# Patient Record
Sex: Female | Born: 1940 | Race: Black or African American | Hispanic: No | Smoking: Never smoker
Health system: Southern US, Community
[De-identification: ages and names within clinical notes are randomized; demographics above are authoritative.]

## PROBLEM LIST (undated history)

## (undated) DIAGNOSIS — H409 Unspecified glaucoma: Secondary | ICD-10-CM

## (undated) DIAGNOSIS — R0600 Dyspnea, unspecified: Secondary | ICD-10-CM

## (undated) DIAGNOSIS — D649 Anemia, unspecified: Secondary | ICD-10-CM

## (undated) DIAGNOSIS — E785 Hyperlipidemia, unspecified: Secondary | ICD-10-CM

## (undated) DIAGNOSIS — I639 Cerebral infarction, unspecified: Secondary | ICD-10-CM

## (undated) DIAGNOSIS — M1990 Unspecified osteoarthritis, unspecified site: Secondary | ICD-10-CM

## (undated) DIAGNOSIS — K5792 Diverticulitis of intestine, part unspecified, without perforation or abscess without bleeding: Secondary | ICD-10-CM

## (undated) DIAGNOSIS — I10 Essential (primary) hypertension: Secondary | ICD-10-CM

## (undated) HISTORY — DX: Unspecified osteoarthritis, unspecified site: M19.90

## (undated) HISTORY — PX: ABDOMINAL HYSTERECTOMY: SHX81

## (undated) HISTORY — DX: Essential (primary) hypertension: I10

## (undated) HISTORY — PX: BREAST SURGERY: SHX581

---

## 1997-10-01 HISTORY — PX: BREAST SURGERY: SHX581

## 2001-02-21 ENCOUNTER — Other Ambulatory Visit: Admission: RE | Admit: 2001-02-21 | Discharge: 2001-02-21 | Payer: Self-pay | Admitting: Family Medicine

## 2001-05-07 ENCOUNTER — Ambulatory Visit (HOSPITAL_COMMUNITY): Admission: RE | Admit: 2001-05-07 | Discharge: 2001-05-07 | Payer: Self-pay | Admitting: General Surgery

## 2001-07-29 ENCOUNTER — Ambulatory Visit (HOSPITAL_COMMUNITY): Admission: RE | Admit: 2001-07-29 | Discharge: 2001-07-29 | Payer: Self-pay | Admitting: Family Medicine

## 2001-07-29 ENCOUNTER — Encounter: Payer: Self-pay | Admitting: Family Medicine

## 2002-07-31 ENCOUNTER — Encounter: Payer: Self-pay | Admitting: Family Medicine

## 2002-07-31 ENCOUNTER — Ambulatory Visit (HOSPITAL_COMMUNITY): Admission: RE | Admit: 2002-07-31 | Discharge: 2002-07-31 | Payer: Self-pay | Admitting: Family Medicine

## 2002-08-28 ENCOUNTER — Ambulatory Visit (HOSPITAL_COMMUNITY): Admission: RE | Admit: 2002-08-28 | Discharge: 2002-08-28 | Payer: Self-pay | Admitting: Family Medicine

## 2002-08-28 ENCOUNTER — Encounter: Payer: Self-pay | Admitting: Family Medicine

## 2002-09-01 ENCOUNTER — Ambulatory Visit (HOSPITAL_COMMUNITY): Admission: RE | Admit: 2002-09-01 | Discharge: 2002-09-01 | Payer: Self-pay | Admitting: Specialist

## 2002-09-01 ENCOUNTER — Encounter: Payer: Self-pay | Admitting: Family Medicine

## 2002-12-01 ENCOUNTER — Ambulatory Visit (HOSPITAL_COMMUNITY): Admission: RE | Admit: 2002-12-01 | Discharge: 2002-12-01 | Payer: Self-pay | Admitting: Family Medicine

## 2002-12-01 ENCOUNTER — Encounter: Payer: Self-pay | Admitting: Family Medicine

## 2003-08-31 ENCOUNTER — Ambulatory Visit (HOSPITAL_COMMUNITY): Admission: RE | Admit: 2003-08-31 | Discharge: 2003-08-31 | Payer: Self-pay | Admitting: Emergency Medicine

## 2003-11-01 ENCOUNTER — Emergency Department (HOSPITAL_COMMUNITY): Admission: EM | Admit: 2003-11-01 | Discharge: 2003-11-01 | Payer: Self-pay | Admitting: Emergency Medicine

## 2004-05-08 ENCOUNTER — Ambulatory Visit (HOSPITAL_COMMUNITY): Admission: RE | Admit: 2004-05-08 | Discharge: 2004-05-08 | Payer: Self-pay | Admitting: Family Medicine

## 2004-12-26 ENCOUNTER — Encounter: Admission: RE | Admit: 2004-12-26 | Discharge: 2004-12-26 | Payer: Self-pay | Admitting: Family Medicine

## 2005-01-22 ENCOUNTER — Ambulatory Visit: Payer: Self-pay | Admitting: Orthopedic Surgery

## 2005-10-25 ENCOUNTER — Ambulatory Visit (HOSPITAL_COMMUNITY): Admission: RE | Admit: 2005-10-25 | Discharge: 2005-10-25 | Payer: Self-pay | Admitting: *Deleted

## 2006-01-22 ENCOUNTER — Ambulatory Visit (HOSPITAL_COMMUNITY): Admission: RE | Admit: 2006-01-22 | Discharge: 2006-01-22 | Payer: Self-pay | Admitting: Family Medicine

## 2007-01-24 ENCOUNTER — Ambulatory Visit (HOSPITAL_COMMUNITY): Admission: RE | Admit: 2007-01-24 | Discharge: 2007-01-24 | Payer: Self-pay | Admitting: Family Medicine

## 2007-09-22 ENCOUNTER — Ambulatory Visit (HOSPITAL_COMMUNITY): Admission: RE | Admit: 2007-09-22 | Discharge: 2007-09-22 | Payer: Self-pay | Admitting: Family Medicine

## 2008-02-12 ENCOUNTER — Ambulatory Visit (HOSPITAL_COMMUNITY): Admission: RE | Admit: 2008-02-12 | Discharge: 2008-02-12 | Payer: Self-pay | Admitting: Family Medicine

## 2008-04-20 DIAGNOSIS — Z8679 Personal history of other diseases of the circulatory system: Secondary | ICD-10-CM | POA: Insufficient documentation

## 2008-04-21 ENCOUNTER — Ambulatory Visit: Payer: Self-pay | Admitting: Orthopedic Surgery

## 2008-04-21 DIAGNOSIS — M25569 Pain in unspecified knee: Secondary | ICD-10-CM

## 2008-04-21 DIAGNOSIS — M1710 Unilateral primary osteoarthritis, unspecified knee: Secondary | ICD-10-CM

## 2008-04-22 ENCOUNTER — Encounter: Payer: Self-pay | Admitting: Orthopedic Surgery

## 2008-05-01 ENCOUNTER — Emergency Department (HOSPITAL_COMMUNITY): Admission: EM | Admit: 2008-05-01 | Discharge: 2008-05-01 | Payer: Self-pay | Admitting: Emergency Medicine

## 2008-10-20 ENCOUNTER — Ambulatory Visit: Payer: Self-pay | Admitting: Orthopedic Surgery

## 2009-01-25 ENCOUNTER — Other Ambulatory Visit: Admission: RE | Admit: 2009-01-25 | Discharge: 2009-01-25 | Payer: Self-pay | Admitting: Family Medicine

## 2009-02-14 ENCOUNTER — Ambulatory Visit (HOSPITAL_COMMUNITY): Admission: RE | Admit: 2009-02-14 | Discharge: 2009-02-14 | Payer: Self-pay | Admitting: Family Medicine

## 2010-03-02 ENCOUNTER — Ambulatory Visit (HOSPITAL_COMMUNITY): Admission: RE | Admit: 2010-03-02 | Discharge: 2010-03-02 | Payer: Self-pay | Admitting: Family Medicine

## 2010-04-19 ENCOUNTER — Ambulatory Visit: Payer: Self-pay | Admitting: Orthopedic Surgery

## 2010-10-31 NOTE — Letter (Signed)
Summary: History form  History form   Imported By: Jacklynn Ganong 04/26/2010 08:30:39  _____________________________________________________________________  External Attachment:    Type:   Image     Comment:   External Document

## 2010-10-31 NOTE — Assessment & Plan Note (Signed)
Summary: bi knee pain left worse needs xr/medicare/bcbs/bsf   Visit Type:  recurrent problem followup  CC:  left knee pain.  History of Present Illness: 70 year old female previously seen for pain in her LEFT knee.  She is now complaining of pain in the back of her knee and leg, denies any injury, describes as throbbing and 2/10.  Says the pain comes and goes came on gradually is better with Aleve.  Seems to be worse if she stays on her feet too long and sometimes she has some knee swelling  She denies weight loss blurred vision chest pain heartburn frequency skin changes numbness paresthesia bruising or bleeding excessive thirst or adverse reactions to food complains of occasional shortness of breath and of course knee stiffness  She has a history of hypertension  Family physician Dr. 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  Allergies: No Known Drug Allergies  Physical Exam  Additional Exam:   *GEN: appearance was normal   ** CDV: normal pulses temperature and no edema  * LYMPH nodes were normal   * SKIN was normal   * Neuro: normal sensation ** Psyche: AAO x 3 and mood was normal   MSK  Her ambulation was normal She had no tenderness or swelling in her knee she had 125 of flexion with grade 5 motor strength.  All ligaments were stable.    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 Patient Level IV (09811) Knee x-ray,  3 views (91478)  Patient Instructions: 1)  Please schedule a follow-up appointment as needed.

## 2011-03-16 ENCOUNTER — Other Ambulatory Visit (HOSPITAL_COMMUNITY): Payer: Self-pay | Admitting: Family Medicine

## 2011-03-16 DIAGNOSIS — Z139 Encounter for screening, unspecified: Secondary | ICD-10-CM

## 2011-03-20 ENCOUNTER — Ambulatory Visit (HOSPITAL_COMMUNITY)
Admission: RE | Admit: 2011-03-20 | Discharge: 2011-03-20 | Disposition: A | Discharge status: Home / Self Care | Payer: Medicare Other | Source: Ambulatory Visit | Attending: Family Medicine | Admitting: Family Medicine

## 2011-03-20 DIAGNOSIS — Z139 Encounter for screening, unspecified: Secondary | ICD-10-CM

## 2011-03-20 DIAGNOSIS — Z1231 Encounter for screening mammogram for malignant neoplasm of breast: Secondary | ICD-10-CM | POA: Insufficient documentation

## 2011-07-06 LAB — COMPREHENSIVE METABOLIC PANEL
Albumin: 3.9
BUN: 15
CO2: 31
Calcium: 9.8
Creatinine, Ser: 1.06
GFR calc Af Amer: >60
Glucose, Bld: 102 — ABNORMAL HIGH
Potassium: 3.8
Sodium: 139
Total Bilirubin: 0.6
Total Protein: 7.5

## 2011-07-06 LAB — LIPID PANEL
Cholesterol: 205 — ABNORMAL HIGH
Total CHOL/HDL Ratio: 6.2
VLDL: 21

## 2011-11-15 DIAGNOSIS — H40059 Ocular hypertension, unspecified eye: Secondary | ICD-10-CM | POA: Diagnosis not present

## 2011-12-05 DIAGNOSIS — H2510 Age-related nuclear cataract, unspecified eye: Secondary | ICD-10-CM | POA: Diagnosis not present

## 2011-12-06 DIAGNOSIS — IMO0002 Reserved for concepts with insufficient information to code with codable children: Secondary | ICD-10-CM | POA: Diagnosis not present

## 2011-12-06 DIAGNOSIS — H2510 Age-related nuclear cataract, unspecified eye: Secondary | ICD-10-CM | POA: Diagnosis not present

## 2012-01-15 DIAGNOSIS — H2510 Age-related nuclear cataract, unspecified eye: Secondary | ICD-10-CM | POA: Diagnosis not present

## 2012-01-21 DIAGNOSIS — H2510 Age-related nuclear cataract, unspecified eye: Secondary | ICD-10-CM | POA: Diagnosis not present

## 2012-01-21 DIAGNOSIS — IMO0002 Reserved for concepts with insufficient information to code with codable children: Secondary | ICD-10-CM | POA: Diagnosis not present

## 2012-02-07 DIAGNOSIS — M1990 Unspecified osteoarthritis, unspecified site: Secondary | ICD-10-CM | POA: Diagnosis not present

## 2012-02-07 DIAGNOSIS — I10 Essential (primary) hypertension: Secondary | ICD-10-CM | POA: Diagnosis not present

## 2012-02-07 DIAGNOSIS — E782 Mixed hyperlipidemia: Secondary | ICD-10-CM | POA: Diagnosis not present

## 2012-02-07 DIAGNOSIS — E781 Pure hyperglyceridemia: Secondary | ICD-10-CM | POA: Diagnosis not present

## 2012-03-31 DIAGNOSIS — H1045 Other chronic allergic conjunctivitis: Secondary | ICD-10-CM | POA: Diagnosis not present

## 2012-04-07 ENCOUNTER — Other Ambulatory Visit (HOSPITAL_COMMUNITY): Payer: Self-pay | Admitting: Family Medicine

## 2012-04-07 DIAGNOSIS — Z139 Encounter for screening, unspecified: Secondary | ICD-10-CM

## 2012-04-14 ENCOUNTER — Ambulatory Visit (HOSPITAL_COMMUNITY)
Admission: RE | Admit: 2012-04-14 | Discharge: 2012-04-14 | Disposition: A | Discharge status: Home / Self Care | Payer: Medicare Other | Source: Ambulatory Visit | Attending: Family Medicine | Admitting: Family Medicine

## 2012-04-14 DIAGNOSIS — Z139 Encounter for screening, unspecified: Secondary | ICD-10-CM

## 2012-04-14 DIAGNOSIS — Z1231 Encounter for screening mammogram for malignant neoplasm of breast: Secondary | ICD-10-CM | POA: Insufficient documentation

## 2012-05-07 DIAGNOSIS — M25519 Pain in unspecified shoulder: Secondary | ICD-10-CM | POA: Diagnosis not present

## 2012-05-12 ENCOUNTER — Ambulatory Visit (HOSPITAL_COMMUNITY)
Admission: RE | Admit: 2012-05-12 | Discharge: 2012-05-12 | Disposition: A | Discharge status: Home / Self Care | Payer: Medicare Other | Source: Ambulatory Visit | Attending: Family Medicine | Admitting: Family Medicine

## 2012-05-12 ENCOUNTER — Other Ambulatory Visit (HOSPITAL_COMMUNITY): Payer: Self-pay | Admitting: Family Medicine

## 2012-05-12 DIAGNOSIS — M2580 Other specified joint disorders, unspecified joint: Secondary | ICD-10-CM | POA: Diagnosis not present

## 2012-05-12 DIAGNOSIS — M25519 Pain in unspecified shoulder: Secondary | ICD-10-CM | POA: Insufficient documentation

## 2012-05-12 DIAGNOSIS — M25829 Other specified joint disorders, unspecified elbow: Secondary | ICD-10-CM | POA: Insufficient documentation

## 2012-05-12 DIAGNOSIS — M719 Bursopathy, unspecified: Secondary | ICD-10-CM | POA: Diagnosis not present

## 2012-05-12 DIAGNOSIS — M67919 Unspecified disorder of synovium and tendon, unspecified shoulder: Secondary | ICD-10-CM | POA: Diagnosis not present

## 2012-05-12 DIAGNOSIS — M19019 Primary osteoarthritis, unspecified shoulder: Secondary | ICD-10-CM | POA: Diagnosis not present

## 2012-06-04 DIAGNOSIS — M25519 Pain in unspecified shoulder: Secondary | ICD-10-CM | POA: Diagnosis not present

## 2012-06-04 DIAGNOSIS — I10 Essential (primary) hypertension: Secondary | ICD-10-CM | POA: Diagnosis not present

## 2012-07-01 DIAGNOSIS — Z23 Encounter for immunization: Secondary | ICD-10-CM | POA: Diagnosis not present

## 2012-07-02 ENCOUNTER — Ambulatory Visit (INDEPENDENT_AMBULATORY_CARE_PROVIDER_SITE_OTHER): Payer: Medicare Other | Admitting: Orthopedic Surgery

## 2012-07-02 ENCOUNTER — Encounter: Payer: Self-pay | Admitting: Orthopedic Surgery

## 2012-07-02 VITALS — BP 104/60 | Ht 64.0 in | Wt 176.0 lb

## 2012-07-02 DIAGNOSIS — M75100 Unspecified rotator cuff tear or rupture of unspecified shoulder, not specified as traumatic: Secondary | ICD-10-CM | POA: Insufficient documentation

## 2012-07-02 DIAGNOSIS — M19019 Primary osteoarthritis, unspecified shoulder: Secondary | ICD-10-CM

## 2012-07-02 DIAGNOSIS — M12819 Other specific arthropathies, not elsewhere classified, unspecified shoulder: Secondary | ICD-10-CM

## 2012-07-02 NOTE — Progress Notes (Signed)
  Subjective:    Patient ID: Monique Wagner, female    DOB: 1941-02-28, 71 y.o.   MRN: [id number]  HPI Comments: The patient has mild LEFT shoulder pain with forward elevation. No weakness. She had x-rays of both shoulders. 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  The LEFT is not as bad on x-ray, but does show subtle changes of developing chronic rotator cuff arthrosis.    Shoulder Pain  The pain is present in the left shoulder. This is a new problem. The current episode started 1 to 4 weeks ago. There has been no history of extremity trauma. The problem occurs daily. The quality of the pain is described as aching.      Review of Systems  Respiratory: Positive for shortness of breath.        Objective:   Physical Exam  Constitutional: She is oriented to person, place, and time. She appears well-developed and well-nourished.  HENT:  Head: Normocephalic.  Eyes: Pupils are equal, round, and reactive to light.  Neck: Normal range of motion.  Pulmonary/Chest: Effort normal.  Abdominal: She exhibits no distension.  Musculoskeletal:       Right shoulder: Normal.       Left shoulder: She exhibits decreased strength. She exhibits normal range of motion, no tenderness, no bony tenderness, no swelling, no deformity, no pain and normal pulse.       She has a positive impingement sign at 120  Lymphadenopathy:    She has no cervical adenopathy.  Neurological: She is alert and oriented to person, place, and time. She has normal reflexes.  Skin: Skin is warm and dry. No rash noted. No erythema. No pallor.  Psychiatric: She has a normal mood and affect. Her behavior is normal. Judgment and thought content normal.   X-rays were done at the hospital  The x-ray report, and the film has been reviewed. My interpretation, of the images: RIGHT rotator cuff arthrosis, chronic, severe  LEFT early signs of rotator cuff arthrosis        Assessment & Plan:    LEFT rotator cuff, chronic disease.  LEFT subacromial injection.  Physical therapy at home after instructions from our occupational therapy department

## 2012-07-02 NOTE — Patient Instructions (Addendum)
You have received a steroid shot. 15% of patients experience increased pain at the injection site with in the next 24 hours. This is best treated with ice and tylenol extra strength 2 tabs every 8 hours. If you are still having pain please call the office.   Start  OT 

## 2012-07-10 ENCOUNTER — Ambulatory Visit (HOSPITAL_COMMUNITY)
Admission: RE | Admit: 2012-07-10 | Discharge: 2012-07-10 | Disposition: A | Discharge status: Home / Self Care | Payer: Medicare Other | Source: Ambulatory Visit | Attending: Orthopedic Surgery | Admitting: Orthopedic Surgery

## 2012-07-10 DIAGNOSIS — M6281 Muscle weakness (generalized): Secondary | ICD-10-CM | POA: Insufficient documentation

## 2012-07-10 DIAGNOSIS — M25519 Pain in unspecified shoulder: Secondary | ICD-10-CM | POA: Diagnosis not present

## 2012-07-10 DIAGNOSIS — M25619 Stiffness of unspecified shoulder, not elsewhere classified: Secondary | ICD-10-CM | POA: Insufficient documentation

## 2012-07-10 DIAGNOSIS — IMO0001 Reserved for inherently not codable concepts without codable children: Secondary | ICD-10-CM | POA: Insufficient documentation

## 2012-07-10 DIAGNOSIS — I10 Essential (primary) hypertension: Secondary | ICD-10-CM | POA: Insufficient documentation

## 2012-07-10 DIAGNOSIS — M75100 Unspecified rotator cuff tear or rupture of unspecified shoulder, not specified as traumatic: Secondary | ICD-10-CM

## 2012-07-10 NOTE — Evaluation (Signed)
Occupational Therapy Evaluation  Patient Details  Name: Monique Wagner MRN: [id number] Date of Birth: October 02, 1940  Today's Date: 07/10/2012 Time: 1020-1050 OT Time Calculation (min): 30 min OT Evaluation 30' Visit#: 1  of 1   Re-eval: 07/10/12  Assessment Diagnosis: Left Shoulder Rotator Cuff Tear Next MD Visit: unscheduled Prior Therapy: none  Authorization: Medicare  Past Medical History:  Past Medical History  Diagnosis Date  . HTN (hypertension)   . Arthritis    Past Surgical History: No past surgical history on file.  Subjective S:  I want some exercises that I can complete at home.  I don't think I need to come to therapy. Pertinent History: Monique Wagner has been experiencing intermittent pain in her left shoulder for greater than a year.  She consulted with her family physician and with Dr. Romeo Apple, who has referred to occupational therapy for a one time visit for education on a HEP.  She states that she is able to do all of her daily activities that she wants to do.   Patient Stated Goals: Im not sure.  I can really do just about everything. Pain Assessment Currently in Pain?: No/denies  Precautions/Restrictions  Precautions Precautions: None  Prior Functioning  Home Living Lives With: Alone Prior Function Level of Independence: Independent with basic ADLs;Independent with homemaking with ambulation Driving: Yes Vocation: Retired Leisure: Hobbies-yes (Comment) Comments: Enjoys staying active, reading, crocheting, and playing the piano  Assessment ADL/Vision/Perception ADL ADL Comments: Monique Wagner has pain occassionally when rolling her hair, laying in her bed, and turning her steering wheel. Dominant Hand: Right Vision - History Baseline Vision: No visual deficits Perception Perception: Within Functional Limits Praxis Praxis: Intact  Cognition/Observation Cognition Orientation Level: Oriented X4  Sensation/Coordination/Edema Sensation Light Touch:  Appears Intact Coordination Gross Motor Movements are Fluid and Coordinated: Yes Fine Motor Movements are Fluid and Coordinated: Yes  Additional Assessments LUE AROM (degrees) LUE Overall AROM Comments: Assessed in seated.  ER/IR with shoulder abducted Left Shoulder Flexion: 142 Degrees Left Shoulder ABduction: 145 Degrees Left Shoulder Internal Rotation: 20 Degrees Left Shoulder External Rotation: 70 Degrees LUE Strength LUE Overall Strength Comments: Limitations: RCR Protocol AAROM 10/8-10/22 then progress to AROM Left Shoulder Flexion: 5/5 Left Shoulder ABduction: 5/5 Left Shoulder Internal Rotation: 5/5 Left Shoulder External Rotation: 5/5     Occupational Therapy Assessment and Plan OT Assessment and Plan Clinical Impression Statement: A:  Patient presents with minimal deficits including decreased AROM and increased pain in her left shoulder.  These deficits are not effecting her day to day activities. OT Frequency: Min 1X/week OT Duration: Other (comment) (1 week) OT Treatment/Interventions: Patient/family education OT Plan: P:  DC from OT services this date.  One time visit for education on HEP.   Goals Home Exercise Program Pt will Perform Home Exercise Program: Independently PT Goal: Perform Home Exercise Program - Progress: Goal set today  Problem List Patient Active Problem List  Diagnosis  . Osteoarth NOS-L/Leg  . KNEE PAIN  . HIGH BLOOD PRESSURE  . Rotator cuff tear arthropathy  . Pain in joint, shoulder region    End of Session Activity Tolerance: Patient tolerated treatment well General Behavior During Session: [facility] for tasks performed Cognition: [facility] for tasks performed OT Plan of Care OT Home Exercise Plan: Educated on HEP for scapular stability with theraband and for shoulder stretches.  Patient demonstrated I with HEP. Consulted and Agree with Plan of Care: Patient  GO Functional Assessment Tool Used: UEFI 86% I level, 24% impairment  level Functional Limitation: Carrying, moving and handling objects Carrying, Moving and Handling Objects Current Status [phone]): At least 20 percent but less than 40 percent impaired, limited or restricted Carrying, Moving and Handling Objects Goal Status [id number]): At least 20 percent but less than 40 percent impaired, limited or restricted Carrying, Moving and Handling Objects Discharge Status [phone]): At least 20 percent but less than 40 percent impaired, limited or restricted  Shirlean Mylar, OTR/L  07/10/2012, 11:01 AM  Physician Documentation Your signature is required to indicate approval of the treatment plan as stated above.  Please sign and either send electronically or make a copy of this report for your files and return this physician signed original.  Please mark one 1.__approve of plan  2. ___approve of plan with the following conditions.   ______________________________                                                          _____________________ Physician Signature                                                                                                             Date

## 2012-09-02 DIAGNOSIS — M1990 Unspecified osteoarthritis, unspecified site: Secondary | ICD-10-CM | POA: Diagnosis not present

## 2012-09-02 DIAGNOSIS — I10 Essential (primary) hypertension: Secondary | ICD-10-CM | POA: Diagnosis not present

## 2012-10-23 DIAGNOSIS — H40059 Ocular hypertension, unspecified eye: Secondary | ICD-10-CM | POA: Diagnosis not present

## 2013-01-05 DIAGNOSIS — I10 Essential (primary) hypertension: Secondary | ICD-10-CM | POA: Diagnosis not present

## 2013-01-05 DIAGNOSIS — E785 Hyperlipidemia, unspecified: Secondary | ICD-10-CM | POA: Diagnosis not present

## 2013-01-05 DIAGNOSIS — M130 Polyarthritis, unspecified: Secondary | ICD-10-CM | POA: Diagnosis not present

## 2013-01-05 DIAGNOSIS — E782 Mixed hyperlipidemia: Secondary | ICD-10-CM | POA: Diagnosis not present

## 2013-01-19 DIAGNOSIS — M1990 Unspecified osteoarthritis, unspecified site: Secondary | ICD-10-CM | POA: Diagnosis not present

## 2013-01-19 DIAGNOSIS — I10 Essential (primary) hypertension: Secondary | ICD-10-CM | POA: Diagnosis not present

## 2013-02-06 DIAGNOSIS — M130 Polyarthritis, unspecified: Secondary | ICD-10-CM | POA: Diagnosis not present

## 2013-03-05 DIAGNOSIS — M719 Bursopathy, unspecified: Secondary | ICD-10-CM | POA: Diagnosis not present

## 2013-03-05 DIAGNOSIS — M67919 Unspecified disorder of synovium and tendon, unspecified shoulder: Secondary | ICD-10-CM | POA: Diagnosis not present

## 2013-03-05 DIAGNOSIS — M25469 Effusion, unspecified knee: Secondary | ICD-10-CM | POA: Diagnosis not present

## 2013-03-05 DIAGNOSIS — M25569 Pain in unspecified knee: Secondary | ICD-10-CM | POA: Diagnosis not present

## 2013-03-16 DIAGNOSIS — M130 Polyarthritis, unspecified: Secondary | ICD-10-CM | POA: Diagnosis not present

## 2013-03-16 DIAGNOSIS — I10 Essential (primary) hypertension: Secondary | ICD-10-CM | POA: Diagnosis not present

## 2013-04-02 ENCOUNTER — Other Ambulatory Visit (HOSPITAL_COMMUNITY): Payer: Self-pay | Admitting: Family Medicine

## 2013-04-02 DIAGNOSIS — Z139 Encounter for screening, unspecified: Secondary | ICD-10-CM

## 2013-04-21 DIAGNOSIS — M25519 Pain in unspecified shoulder: Secondary | ICD-10-CM | POA: Diagnosis not present

## 2013-04-21 DIAGNOSIS — M25569 Pain in unspecified knee: Secondary | ICD-10-CM | POA: Diagnosis not present

## 2013-04-30 ENCOUNTER — Ambulatory Visit (HOSPITAL_COMMUNITY)
Admission: RE | Admit: 2013-04-30 | Discharge: 2013-04-30 | Disposition: A | Discharge status: Home / Self Care | Payer: Medicare Other | Source: Ambulatory Visit | Attending: Family Medicine | Admitting: Family Medicine

## 2013-04-30 DIAGNOSIS — Z1231 Encounter for screening mammogram for malignant neoplasm of breast: Secondary | ICD-10-CM | POA: Insufficient documentation

## 2013-04-30 DIAGNOSIS — Z139 Encounter for screening, unspecified: Secondary | ICD-10-CM

## 2013-05-19 DIAGNOSIS — Z09 Encounter for follow-up examination after completed treatment for conditions other than malignant neoplasm: Secondary | ICD-10-CM | POA: Diagnosis not present

## 2013-05-19 DIAGNOSIS — Z86010 Personal history of colonic polyps: Secondary | ICD-10-CM | POA: Diagnosis not present

## 2013-05-19 DIAGNOSIS — K648 Other hemorrhoids: Secondary | ICD-10-CM | POA: Diagnosis not present

## 2013-05-27 DIAGNOSIS — Z0000 Encounter for general adult medical examination without abnormal findings: Secondary | ICD-10-CM | POA: Diagnosis not present

## 2013-08-14 DIAGNOSIS — L0201 Cutaneous abscess of face: Secondary | ICD-10-CM | POA: Diagnosis not present

## 2013-08-21 DIAGNOSIS — L0201 Cutaneous abscess of face: Secondary | ICD-10-CM | POA: Diagnosis not present

## 2013-10-22 DIAGNOSIS — H2510 Age-related nuclear cataract, unspecified eye: Secondary | ICD-10-CM | POA: Diagnosis not present

## 2013-10-22 DIAGNOSIS — H5200 Hypermetropia, unspecified eye: Secondary | ICD-10-CM | POA: Diagnosis not present

## 2014-01-20 DIAGNOSIS — I10 Essential (primary) hypertension: Secondary | ICD-10-CM | POA: Diagnosis not present

## 2014-01-20 DIAGNOSIS — M1990 Unspecified osteoarthritis, unspecified site: Secondary | ICD-10-CM | POA: Diagnosis not present

## 2014-03-29 ENCOUNTER — Other Ambulatory Visit (HOSPITAL_COMMUNITY): Payer: Self-pay | Admitting: Family Medicine

## 2014-03-29 DIAGNOSIS — Z139 Encounter for screening, unspecified: Secondary | ICD-10-CM

## 2014-03-29 DIAGNOSIS — Z1231 Encounter for screening mammogram for malignant neoplasm of breast: Secondary | ICD-10-CM

## 2014-05-03 ENCOUNTER — Ambulatory Visit (HOSPITAL_COMMUNITY)
Admission: RE | Admit: 2014-05-03 | Discharge: 2014-05-03 | Disposition: A | Discharge status: Home / Self Care | Payer: Medicare Other | Source: Ambulatory Visit | Attending: Family Medicine | Admitting: Family Medicine

## 2014-05-03 DIAGNOSIS — Z1231 Encounter for screening mammogram for malignant neoplasm of breast: Secondary | ICD-10-CM | POA: Diagnosis not present

## 2014-06-29 DIAGNOSIS — M130 Polyarthritis, unspecified: Secondary | ICD-10-CM | POA: Diagnosis not present

## 2014-06-29 DIAGNOSIS — E781 Pure hyperglyceridemia: Secondary | ICD-10-CM | POA: Diagnosis not present

## 2014-06-29 DIAGNOSIS — Z23 Encounter for immunization: Secondary | ICD-10-CM | POA: Diagnosis not present

## 2014-06-29 DIAGNOSIS — I10 Essential (primary) hypertension: Secondary | ICD-10-CM | POA: Diagnosis not present

## 2014-07-21 DIAGNOSIS — M129 Arthropathy, unspecified: Secondary | ICD-10-CM | POA: Diagnosis not present

## 2014-07-21 DIAGNOSIS — Z713 Dietary counseling and surveillance: Secondary | ICD-10-CM | POA: Diagnosis not present

## 2014-08-04 DIAGNOSIS — M170 Bilateral primary osteoarthritis of knee: Secondary | ICD-10-CM | POA: Diagnosis not present

## 2014-08-04 DIAGNOSIS — M7532 Calcific tendinitis of left shoulder: Secondary | ICD-10-CM | POA: Diagnosis not present

## 2014-08-04 DIAGNOSIS — M25422 Effusion, left elbow: Secondary | ICD-10-CM | POA: Diagnosis not present

## 2014-08-04 DIAGNOSIS — M7989 Other specified soft tissue disorders: Secondary | ICD-10-CM | POA: Diagnosis not present

## 2014-09-01 DIAGNOSIS — M25449 Effusion, unspecified hand: Secondary | ICD-10-CM | POA: Diagnosis not present

## 2014-09-01 DIAGNOSIS — R700 Elevated erythrocyte sedimentation rate: Secondary | ICD-10-CM | POA: Diagnosis not present

## 2014-09-01 DIAGNOSIS — M130 Polyarthritis, unspecified: Secondary | ICD-10-CM | POA: Diagnosis not present

## 2014-09-01 DIAGNOSIS — M7532 Calcific tendinitis of left shoulder: Secondary | ICD-10-CM | POA: Diagnosis not present

## 2014-09-03 ENCOUNTER — Other Ambulatory Visit: Payer: Self-pay

## 2014-09-03 ENCOUNTER — Encounter (HOSPITAL_COMMUNITY): Payer: Self-pay | Admitting: *Deleted

## 2014-09-03 ENCOUNTER — Observation Stay (HOSPITAL_COMMUNITY)
Admission: EM | Admit: 2014-09-03 | Discharge: 2014-09-04 | Disposition: A | Discharge status: Home / Self Care | Payer: Medicare Other | Attending: Internal Medicine | Admitting: Internal Medicine

## 2014-09-03 ENCOUNTER — Emergency Department (HOSPITAL_COMMUNITY): Payer: Medicare Other

## 2014-09-03 DIAGNOSIS — R0789 Other chest pain: Secondary | ICD-10-CM | POA: Diagnosis not present

## 2014-09-03 DIAGNOSIS — E785 Hyperlipidemia, unspecified: Secondary | ICD-10-CM | POA: Insufficient documentation

## 2014-09-03 DIAGNOSIS — Z7952 Long term (current) use of systemic steroids: Secondary | ICD-10-CM | POA: Insufficient documentation

## 2014-09-03 DIAGNOSIS — R072 Precordial pain: Secondary | ICD-10-CM

## 2014-09-03 DIAGNOSIS — I10 Essential (primary) hypertension: Secondary | ICD-10-CM | POA: Diagnosis not present

## 2014-09-03 DIAGNOSIS — M1990 Unspecified osteoarthritis, unspecified site: Secondary | ICD-10-CM | POA: Diagnosis not present

## 2014-09-03 DIAGNOSIS — N289 Disorder of kidney and ureter, unspecified: Secondary | ICD-10-CM | POA: Diagnosis not present

## 2014-09-03 DIAGNOSIS — Z7982 Long term (current) use of aspirin: Secondary | ICD-10-CM | POA: Insufficient documentation

## 2014-09-03 DIAGNOSIS — R079 Chest pain, unspecified: Principal | ICD-10-CM | POA: Diagnosis present

## 2014-09-03 DIAGNOSIS — Z79899 Other long term (current) drug therapy: Secondary | ICD-10-CM | POA: Diagnosis not present

## 2014-09-03 DIAGNOSIS — E782 Mixed hyperlipidemia: Secondary | ICD-10-CM

## 2014-09-03 HISTORY — DX: Hyperlipidemia, unspecified: E78.5

## 2014-09-03 LAB — CBC WITH DIFFERENTIAL/PLATELET
BASOS PCT: 0 % (ref 0–1)
Basophils Absolute: 0 10*3/uL (ref 0.0–0.1)
EOS ABS: 0 10*3/uL (ref 0.0–0.7)
EOS PCT: 0 % (ref 0–5)
HEMATOCRIT: 39.6 % (ref 36.0–46.0)
HEMOGLOBIN: 12.9 g/dL (ref 12.0–15.0)
LYMPHS ABS: 1.9 10*3/uL (ref 0.7–4.0)
Lymphocytes Relative: 25 % (ref 12–46)
MCH: 28.9 pg (ref 26.0–34.0)
MCHC: 32.6 g/dL (ref 30.0–36.0)
MCV: 88.6 fL (ref 78.0–100.0)
MONO ABS: 0.3 10*3/uL (ref 0.1–1.0)
MONOS PCT: 4 % (ref 3–12)
Neutro Abs: 5.2 10*3/uL (ref 1.7–7.7)
Neutrophils Relative %: 71 % (ref 43–77)
Platelets: 211 10*3/uL (ref 150–400)
RBC: 4.47 MIL/uL (ref 3.87–5.11)
RDW: 15.1 % (ref 11.5–15.5)
WBC: 7.3 10*3/uL (ref 4.0–10.5)

## 2014-09-03 LAB — BASIC METABOLIC PANEL
Anion gap: 13 (ref 5–15)
BUN: 22 mg/dL (ref 6–23)
CALCIUM: 10.6 mg/dL — AB (ref 8.4–10.5)
CHLORIDE: 96 meq/L (ref 96–112)
CO2: 31 meq/L (ref 19–32)
CREATININE: 1.61 mg/dL — AB (ref 0.50–1.10)
GFR calc Af Amer: 36 mL/min — ABNORMAL LOW (ref >90)
GFR calc non Af Amer: 31 mL/min — ABNORMAL LOW (ref >90)
Glucose, Bld: 103 mg/dL — ABNORMAL HIGH (ref 70–99)
Potassium: 3.4 mEq/L — ABNORMAL LOW (ref 3.7–5.3)
Sodium: 140 mEq/L (ref 137–147)

## 2014-09-03 LAB — TROPONIN I: Troponin I: <0.3 ng/mL (ref <0.30)

## 2014-09-03 MED ORDER — ACETAMINOPHEN 650 MG RE SUPP: 650.0000 mg | Freq: Four times a day (QID) | RECTAL | Status: DC | PRN

## 2014-09-03 MED ORDER — SODIUM CHLORIDE 0.9 % IJ SOLN: 3.0000 mL | INTRAMUSCULAR | Status: DC | PRN

## 2014-09-03 MED ORDER — LOSARTAN POTASSIUM-HCTZ 50-12.5 MG PO TABS: 1.0000 | ORAL_TABLET | Freq: Every day | ORAL | Status: DC

## 2014-09-03 MED ORDER — PREDNISONE 10 MG PO TABS
10.0000 mg | ORAL_TABLET | Freq: Every day | ORAL | Status: DC
  Administered 2014-09-04: 10 mg via ORAL

## 2014-09-03 MED ORDER — ASPIRIN EC 325 MG PO TBEC
325.0000 mg | DELAYED_RELEASE_TABLET | Freq: Every day | ORAL | Status: DC
  Administered 2014-09-04: 325 mg via ORAL

## 2014-09-03 MED ORDER — ENOXAPARIN SODIUM 40 MG/0.4ML ~~LOC~~ SOLN: 40.0000 mg | SUBCUTANEOUS | Status: DC

## 2014-09-03 MED ORDER — ACETAMINOPHEN 325 MG PO TABS: 650.0000 mg | ORAL_TABLET | Freq: Four times a day (QID) | ORAL | Status: DC | PRN

## 2014-09-03 MED ORDER — SENNOSIDES-DOCUSATE SODIUM 8.6-50 MG PO TABS: 1.0000 | ORAL_TABLET | Freq: Every evening | ORAL | Status: DC | PRN

## 2014-09-03 MED ORDER — ASPIRIN 81 MG PO CHEW: 324.0000 mg | CHEWABLE_TABLET | Freq: Once | ORAL | Status: DC

## 2014-09-03 MED ORDER — ONDANSETRON HCL 4 MG/2ML IJ SOLN
4.0000 mg | Freq: Four times a day (QID) | INTRAMUSCULAR | Status: DC | PRN
Start: 2014-09-03 — End: 2014-09-04

## 2014-09-03 MED ORDER — OMEGA-3-ACID ETHYL ESTERS 1 G PO CAPS
1.0000 g | ORAL_CAPSULE | Freq: Every day | ORAL | Status: DC
  Administered 2014-09-04: 1 g via ORAL

## 2014-09-03 MED ORDER — SODIUM CHLORIDE 0.9 % IV SOLN: 250.0000 mL | INTRAVENOUS | Status: DC | PRN

## 2014-09-03 MED ORDER — HYDROCHLOROTHIAZIDE 12.5 MG PO CAPS
12.5000 mg | ORAL_CAPSULE | Freq: Every day | ORAL | Status: DC
  Administered 2014-09-04: 12.5 mg via ORAL

## 2014-09-03 MED ORDER — ONDANSETRON HCL 4 MG PO TABS: 4.0000 mg | ORAL_TABLET | Freq: Four times a day (QID) | ORAL | Status: DC | PRN

## 2014-09-03 MED ORDER — SODIUM CHLORIDE 0.9 % IJ SOLN
3.0000 mL | Freq: Two times a day (BID) | INTRAMUSCULAR | Status: DC
  Administered 2014-09-03 – 2014-09-04 (×2): 3 mL via INTRAVENOUS

## 2014-09-03 MED ORDER — LOSARTAN POTASSIUM 50 MG PO TABS
50.0000 mg | ORAL_TABLET | Freq: Every day | ORAL | Status: DC
  Administered 2014-09-04: 50 mg via ORAL

## 2014-09-03 MED ORDER — ATORVASTATIN CALCIUM 20 MG PO TABS
20.0000 mg | ORAL_TABLET | Freq: Every evening | ORAL | Status: DC
  Administered 2014-09-03: 20 mg via ORAL

## 2014-09-03 MED ORDER — NITROGLYCERIN 0.4 MG SL SUBL
0.4000 mg | SUBLINGUAL_TABLET | SUBLINGUAL | Status: DC | PRN
  Administered 2014-09-03: 0.4 mg via SUBLINGUAL

## 2014-09-03 MED ORDER — OXYCODONE HCL 5 MG PO TABS: 5.0000 mg | ORAL_TABLET | ORAL | Status: DC | PRN

## 2014-09-03 MED ORDER — CARVEDILOL PHOSPHATE ER 10 MG PO CP24
20.0000 mg | ORAL_CAPSULE | Freq: Every day | ORAL | Status: DC
  Administered 2014-09-04: 20 mg via ORAL
  Filled 2014-09-03 (×3): qty 2

## 2014-09-03 NOTE — ED Provider Notes (Signed)
CSN: [id number]     Arrival date & time 09/03/14  1138 History   First MD Initiated Contact with Patient 09/03/14 1235     Chief Complaint  Patient presents with  . Chest Pain      HPI  Pt was seen at 1245. Per pt, c/o gradual onset and persistence of multiple intermittent episodes of chest "discomfort" for the past 1 month, worse over the past 2 weeks. Pt describes her symptoms as mid-sternal chest "heaviness." States she "notices it" when she is "doing things," and laying down improves her discomfort. States she "walked out of church" several days ago "because it was starting up again." Pt has not taken any meds for same. Denies any relationship with food intake. Denies palpitations, no SOB/cough, no abd pain, no N/V/D, no fevers, no rash.     Past Medical History  Diagnosis Date  . HTN (hypertension)   . Arthritis   . Hyperlipidemia    Past Surgical History  Procedure Laterality Date  . Abdominal hysterectomy     Family History  Problem Relation Age of Onset  . Arthritis    . Cancer    . Diabetes    . Heart disease Other    History  Substance Use Topics  . Smoking status: Never Smoker   . Smokeless tobacco: Not on file  . Alcohol Use: No    Review of Systems ROS: Statement: All systems negative except as marked or noted in the HPI; Constitutional: Negative for fever and chills. ; ; Eyes: Negative for eye pain, redness and discharge. ; ; ENMT: Negative for ear pain, hoarseness, nasal congestion, sinus pressure and sore throat. ; ; Cardiovascular: +CP. Negative for palpitations, diaphoresis, dyspnea and peripheral edema. ; ; Respiratory: Negative for cough, wheezing and stridor. ; ; Gastrointestinal: Negative for nausea, vomiting, diarrhea, abdominal pain, blood in stool, hematemesis, jaundice and rectal bleeding. . ; ; Genitourinary: Negative for dysuria, flank pain and hematuria. ; ; Musculoskeletal: Negative for back pain and neck pain. Negative for swelling and trauma.; ;  Skin: Negative for pruritus, rash, abrasions, blisters, bruising and skin lesion.; ; Neuro: Negative for headache, lightheadedness and neck stiffness. Negative for weakness, altered level of consciousness , altered mental status, extremity weakness, paresthesias, involuntary movement, seizure and syncope.      Allergies  Review of patient's allergies indicates no known allergies.  Home Medications   Prior to Admission medications   Medication Sig Start Date End Date Taking? Authorizing Provider  aspirin 325 MG EC tablet Take 325 mg by mouth daily.   Yes Historical Provider, MD  atorvastatin (LIPITOR) 20 MG tablet Take 20 mg by mouth daily.   Yes Historical Provider, MD  carvedilol (COREG CR) 20 MG 24 hr capsule Take 20 mg by mouth daily.   Yes Historical Provider, MD  losartan-hydrochlorothiazide (HYZAAR) 50-12.5 MG per tablet Take 1 tablet by mouth daily.   Yes Historical Provider, MD  Multiple Vitamins-Minerals (MULTIVITAMIN PO) Take 1 tablet by mouth daily.    Yes Historical Provider, MD  Omega-3 Fatty Acids (FISH OIL PO) Take 1 capsule by mouth daily.    Yes Historical Provider, MD  predniSONE (DELTASONE) 10 MG tablet Take 1 tablet by mouth daily. 08/04/14  Yes Historical Provider, MD  triamterene-hydrochlorothiazide (DYAZIDE) 37.5-25 MG per capsule Take 1 capsule by mouth every morning.   Yes Historical Provider, MD   BP 108/64 mmHg  Pulse 54  Temp(Src) 98.6 F (37 C) (Oral)  Resp 18  Ht 5\' 4"  (  1.626 m)  Wt 174 lb (78.926 kg)  BMI 29.85 kg/m2  SpO2 100% Physical Exam  1250: Physical examination:  Nursing notes reviewed; Vital signs and O2 SAT reviewed;  Constitutional: Well developed, Well nourished, Well hydrated, In no acute distress; Head:  Normocephalic, atraumatic; Eyes: EOMI, PERRL, No scleral icterus; ENMT: Mouth and pharynx normal, Mucous membranes moist; Neck: Supple, Full range of motion, No lymphadenopathy; Cardiovascular: Regular rate and rhythm, No gallop; Respiratory:  Breath sounds clear & equal bilaterally, No rales, rhonchi, wheezes.  Speaking full sentences with ease, Normal respiratory effort/excursion; Chest: Nontender, Movement normal; Abdomen: Soft, Nontender, Nondistended, Normal bowel sounds; Genitourinary: No CVA tenderness; Extremities: Pulses normal, No tenderness, No edema, No calf edema or asymmetry.; Neuro: AA&Ox3, vague historian. Major CN grossly intact.  Speech clear. No gross focal motor or sensory deficits in extremities. Climbs on and off stretcher easily by herself. Gait steady.; Skin: Color normal, Warm, Dry.   ED Course  Procedures     EKG Interpretation None      MDM  MDM Reviewed: previous chart, nursing note and vitals Reviewed previous: labs and ECG Interpretation: labs, ECG and x-ray      Date: 09/03/2014  Rate: 56  Rhythm: normal sinus rhythm  QRS Axis: left  Intervals: normal  ST/T Wave abnormalities: normal  Conduction Disutrbances:none  Narrative Interpretation: abnormal R-wave progression, early transition  Old EKG Reviewed: none available.   Results for orders placed or performed during the hospital encounter of 09/03/14  Basic metabolic panel  Result Value Ref Range   Sodium 140 137 - 147 mEq/L   Potassium 3.4 (L) 3.7 - 5.3 mEq/L   Chloride 96 96 - 112 mEq/L   CO2 31 19 - 32 mEq/L   Glucose, Bld 103 (H) 70 - 99 mg/dL   BUN 22 6 - 23 mg/dL   Creatinine, Ser 9.511.61 (H) 0.50 - 1.10 mg/dL   Calcium 88.410.6 (H) 8.4 - 10.5 mg/dL   GFR calc non Af Amer 31 (L) >90 mL/min   GFR calc Af Amer 36 (L) >90 mL/min   Anion gap 13 5 - 15  Troponin I  Result Value Ref Range   Troponin I <0.30 <0.30 ng/mL  CBC with Differential  Result Value Ref Range   WBC 7.3 4.0 - 10.5 K/uL   RBC 4.47 3.87 - 5.11 MIL/uL   Hemoglobin 12.9 12.0 - 15.0 g/dL   HCT 16.639.6 06.336.0 - 01.646.0 %   MCV 88.6 78.0 - 100.0 fL   MCH 28.9 26.0 - 34.0 pg   MCHC 32.6 30.0 - 36.0 g/dL   RDW 01.015.1 93.211.5 - 35.515.5 %   Platelets 211 150 - 400 K/uL    Neutrophils Relative % 71 43 - 77 %   Neutro Abs 5.2 1.7 - 7.7 K/uL   Lymphocytes Relative 25 12 - 46 %   Lymphs Abs 1.9 0.7 - 4.0 K/uL   Monocytes Relative 4 3 - 12 %   Monocytes Absolute 0.3 0.1 - 1.0 K/uL   Eosinophils Relative 0 0 - 5 %   Eosinophils Absolute 0.0 0.0 - 0.7 K/uL   Basophils Relative 0 0 - 1 %   Basophils Absolute 0.0 0.0 - 0.1 K/uL   Dg Chest 2 View 09/03/2014   CLINICAL DATA:  Chest tightness.  EXAM: CHEST  2 VIEW  COMPARISON:  PA and lateral chest 09/22/2007.  FINDINGS: The heart size and mediastinal contours are within normal limits. Both lungs are clear. The visualized skeletal structures are  unremarkable.  IMPRESSION: No active cardiopulmonary disease.   Electronically Signed   By: Drusilla Kannerhomas  Dalessio M.D.   On: 09/03/2014 13:38    1435:  Pt improved after SL ntg. Several cardiac risk factors, will observation admit. Dx and testing d/w pt and family.  Questions answered.  Verb understanding, agreeable to admit. T/C to [facility] Dr. Ardyth HarpsHernandez, case discussed, including:  HPI, pertinent PM/SHx, VS/PE, dx testing, ED course and treatment:  Agreeable to admit, requests to write temporary orders, obtain observation tele bed to team [id number].    Samuel JesterKathleen Michele Judy, DO 09/05/14 1321

## 2014-09-03 NOTE — Progress Notes (Signed)
Patient refused Hyzaar and Aspirin patient states she took her medication earlier.

## 2014-09-03 NOTE — ED Notes (Signed)
Chest pain for 2 weeks ,intermittent "uncomfortable"   Had cough 2 weeks ago.  No N/V  , no fever

## 2014-09-03 NOTE — H&P (Addendum)
[facility]          History and Physical    PCP:   Maggie Font, MD   Chief Complaint:  Chest pain  HPI: Patient is a 73 year old lady with past medical history significant for hypertension, hyperlipidemia, rheumatoid arthritis maintained on chronic prednisone. She presents to the hospital today with complaints of about a 2 week history of precordial chest pain. She has "not paid any attention to it" and hence cannot give me any qualifying factors about her pain, i.e. she does not know if it is related to exertion or to food. When asked why she decided to come to the hospital today she tells me that with the holidays approaching she wanted to make sure that she was okay. She denies shortness of breath, headache, palpitations, lightheadedness, dizziness. She tells me that about 15 years ago she saw Dr. Melvern Banker for chest pain and she had a cardiac catheterization that was reported as "normal"; however this predates Epic and I do not have a way of retrieving this record. We have been asked to admit her for further evaluation and management.  Allergies:  No Known Allergies    Past Medical History  Diagnosis Date  . HTN (hypertension)   . Arthritis   . Hyperlipidemia     Past Surgical History  Procedure Laterality Date  . Abdominal hysterectomy      Prior to Admission medications   Medication Sig Start Date End Date Taking? Authorizing Provider  aspirin 325 MG EC tablet Take 325 mg by mouth daily.   Yes Historical Provider, MD  atorvastatin (LIPITOR) 20 MG tablet Take 20 mg by mouth daily.   Yes Historical Provider, MD  carvedilol (COREG CR) 20 MG 24 hr capsule Take 20 mg by mouth daily.   Yes Historical Provider, MD  losartan-hydrochlorothiazide (HYZAAR) 50-12.5 MG per tablet Take 1 tablet by mouth daily.   Yes Historical Provider, MD  Multiple Vitamins-Minerals (MULTIVITAMIN PO) Take 1 tablet by mouth daily.    Yes Historical Provider, MD  Omega-3 Fatty  Acids (FISH OIL PO) Take 1 capsule by mouth daily.    Yes Historical Provider, MD  predniSONE (DELTASONE) 10 MG tablet Take 1 tablet by mouth daily. 08/04/14  Yes Historical Provider, MD  triamterene-hydrochlorothiazide (DYAZIDE) 37.5-25 MG per capsule Take 1 capsule by mouth every morning.   Yes Historical Provider, MD    Social History:  reports that she has never smoked. She does not have any smokeless tobacco history on file. She reports that she does not drink alcohol or use illicit drugs.  Family History  Problem Relation Age of Onset  . Arthritis    . Cancer    . Diabetes    . Heart disease Other     Review of Systems:  Constitutional: Denies fever, chills, diaphoresis, appetite change and fatigue.  HEENT: Denies photophobia, eye pain, redness, hearing loss, ear pain, congestion, sore throat, rhinorrhea, sneezing, mouth sores, trouble swallowing, neck pain, neck stiffness and tinnitus.   Respiratory: Denies SOB, DOE, cough and wheezing.   Cardiovascular: Denies palpitations and leg swelling.  Gastrointestinal: Denies nausea, vomiting, abdominal pain, diarrhea, constipation, blood in stool and abdominal distention.  Genitourinary: Denies dysuria, urgency, frequency, hematuria, flank pain and difficulty urinating.  Endocrine: Denies: hot or cold intolerance, sweats, changes in hair or nails, polyuria, polydipsia. Musculoskeletal: Denies myalgias, back pain, joint swelling, arthralgias and gait problem.  Skin: Denies pallor,  rash and wound.  Neurological: Denies dizziness, seizures, syncope, weakness, light-headedness, numbness and headaches.  Hematological: Denies adenopathy. Easy bruising, personal or family bleeding history  Psychiatric/Behavioral: Denies suicidal ideation, mood changes, confusion, nervousness, sleep disturbance and agitation   Physical Exam: Blood pressure 118/56, pulse 57, temperature 98.6 F (37 C), temperature source Oral, resp. rate 18, height 5' 4" (1.626  m), weight 78.926 kg (174 lb), SpO2 100 %. General: Alert, awake, oriented 3, no distress, pleasant and cooperative with exam. HEENT: Normocephalic, atraumatic, pupils equal round and reactive to light, extraocular movements intact. Neck: Supple, no JVD, no lymphadenopathy, no bruits, no goiter. Cardiovascular: Regular rate and rhythm, no murmurs, rubs or gallops. Lungs: Clear to auscultation bilaterally. Abdomen: Soft, nontender, nondistended, positive bowel sounds, no masses or organomegaly noted. Extremities: No clubbing, cyanosis or edema, positive pedal pulses. Neurologic: Grossly intact and nonfocal.  Labs on Admission:  Results for orders placed or performed during the hospital encounter of 09/03/14 (from the past 48 hour(s))  Basic metabolic panel     Status: Abnormal   Collection Time: 09/03/14 12:23 PM  Result Value Ref Range   Sodium 140 137 - 147 mEq/L   Potassium 3.4 (L) 3.7 - 5.3 mEq/L   Chloride 96 96 - 112 mEq/L   CO2 31 19 - 32 mEq/L   Glucose, Bld 103 (H) 70 - 99 mg/dL   BUN 22 6 - 23 mg/dL   Creatinine, Ser 1.61 (H) 0.50 - 1.10 mg/dL   Calcium 10.6 (H) 8.4 - 10.5 mg/dL   GFR calc non Af Amer 31 (L) >90 mL/min   GFR calc Af Amer 36 (L) >90 mL/min    Comment: (NOTE) The eGFR has been calculated using the CKD EPI equation. This calculation has not been validated in all clinical situations. eGFR's persistently <90 mL/min signify possible Chronic Kidney Disease.    Anion gap 13 5 - 15  Troponin I     Status: None   Collection Time: 09/03/14 12:23 PM  Result Value Ref Range   Troponin I <0.30 <0.30 ng/mL    Comment:        Due to the release kinetics of cTnI, a negative result within the first hours of the onset of symptoms does not rule out myocardial infarction with certainty. If myocardial infarction is still suspected, repeat the test at appropriate intervals.   CBC with Differential     Status: None   Collection Time: 09/03/14 12:23 PM  Result Value  Ref Range   WBC 7.3 4.0 - 10.5 K/uL   RBC 4.47 3.87 - 5.11 MIL/uL   Hemoglobin 12.9 12.0 - 15.0 g/dL   HCT 39.6 36.0 - 46.0 %   MCV 88.6 78.0 - 100.0 fL   MCH 28.9 26.0 - 34.0 pg   MCHC 32.6 30.0 - 36.0 g/dL   RDW 15.1 11.5 - 15.5 %   Platelets 211 150 - 400 K/uL   Neutrophils Relative % 71 43 - 77 %   Neutro Abs 5.2 1.7 - 7.7 K/uL   Lymphocytes Relative 25 12 - 46 %   Lymphs Abs 1.9 0.7 - 4.0 K/uL   Monocytes Relative 4 3 - 12 %   Monocytes Absolute 0.3 0.1 - 1.0 K/uL   Eosinophils Relative 0 0 - 5 %   Eosinophils Absolute 0.0 0.0 - 0.7 K/uL   Basophils Relative 0 0 - 1 %   Basophils Absolute 0.0 0.0 - 0.1 K/uL    Radiological Exams on Admission: Dg Chest  2 View  09/03/2014   CLINICAL DATA:  Chest tightness.  EXAM: CHEST  2 VIEW  COMPARISON:  PA and lateral chest 09/22/2007.  FINDINGS: The heart size and mediastinal contours are within normal limits. Both lungs are clear. The visualized skeletal structures are unremarkable.  IMPRESSION: No active cardiopulmonary disease.   Electronically Signed   By: Inge Rise M.D.   On: 09/03/2014 13:38    Assessment/Plan Principal Problem:   Chest pain Active Problems:   HTN (hypertension)   Hyperlipidemia   Chest pain -Coronary artery disease risk factors include hypertension and hyperlipidemia. -We'll admit to a telemetry bed, will cycle troponins to rule out acute coronary syndrome. -Interestingly, her chest pain did improve with administration of aspirin and nitroglycerin in the emergency department. -EKG is not presently available for review, will order EKG today and tomorrow morning. -If she rules out, we'll plan on arranging outpatient follow-up with cardiology for possible stress testing.  Hypertension -Continue home medications.  Hyperlipidemia -Continue home medications, check lipid profile.  DVT prophylaxis -Lovenox  CODE STATUS -Full code   Time Spent on Admission: 75 minutes  [facility] Pager: [phone] 09/03/2014, 4:31 PM

## 2014-09-04 ENCOUNTER — Other Ambulatory Visit: Payer: Self-pay

## 2014-09-04 DIAGNOSIS — R079 Chest pain, unspecified: Secondary | ICD-10-CM | POA: Diagnosis not present

## 2014-09-04 DIAGNOSIS — M1990 Unspecified osteoarthritis, unspecified site: Secondary | ICD-10-CM | POA: Diagnosis not present

## 2014-09-04 DIAGNOSIS — E785 Hyperlipidemia, unspecified: Secondary | ICD-10-CM | POA: Diagnosis not present

## 2014-09-04 DIAGNOSIS — I10 Essential (primary) hypertension: Secondary | ICD-10-CM | POA: Diagnosis not present

## 2014-09-04 DIAGNOSIS — N289 Disorder of kidney and ureter, unspecified: Secondary | ICD-10-CM | POA: Diagnosis not present

## 2014-09-04 LAB — BASIC METABOLIC PANEL
ANION GAP: 14 (ref 5–15)
BUN: 22 mg/dL (ref 6–23)
CHLORIDE: 97 meq/L (ref 96–112)
CO2: 29 meq/L (ref 19–32)
CREATININE: 1.2 mg/dL — AB (ref 0.50–1.10)
Calcium: 9.8 mg/dL (ref 8.4–10.5)
GFR calc Af Amer: 51 mL/min — ABNORMAL LOW (ref >90)
GFR calc non Af Amer: 44 mL/min — ABNORMAL LOW (ref >90)
Glucose, Bld: 87 mg/dL (ref 70–99)
Potassium: 3.4 mEq/L — ABNORMAL LOW (ref 3.7–5.3)
Sodium: 140 mEq/L (ref 137–147)

## 2014-09-04 LAB — CBC
HCT: 37.4 % (ref 36.0–46.0)
HEMOGLOBIN: 12.3 g/dL (ref 12.0–15.0)
MCH: 29.3 pg (ref 26.0–34.0)
MCHC: 32.9 g/dL (ref 30.0–36.0)
MCV: 89 fL (ref 78.0–100.0)
Platelets: 195 10*3/uL (ref 150–400)
RBC: 4.2 MIL/uL (ref 3.87–5.11)
RDW: 14.9 % (ref 11.5–15.5)
WBC: 8.1 10*3/uL (ref 4.0–10.5)

## 2014-09-04 LAB — LIPID PANEL
Cholesterol: 174 mg/dL (ref 0–200)
HDL: 67 mg/dL (ref >39)
LDL CALC: 74 mg/dL (ref 0–99)
TRIGLYCERIDES: 166 mg/dL — AB (ref <150)
Total CHOL/HDL Ratio: 2.6 RATIO
VLDL: 33 mg/dL (ref 0–40)

## 2014-09-04 LAB — TROPONIN I: Troponin I: <0.3 ng/mL (ref <0.30)

## 2014-09-04 LAB — HEMOGLOBIN A1C
Hgb A1c MFr Bld: 6.4 % — ABNORMAL HIGH (ref <5.7)
MEAN PLASMA GLUCOSE: 137 mg/dL — AB (ref <117)

## 2014-09-04 LAB — TSH: TSH: 0.888 u[IU]/mL (ref 0.350–4.500)

## 2014-09-04 MED ORDER — LOSARTAN POTASSIUM 50 MG PO TABS
ORAL_TABLET | ORAL | Status: AC
  Filled 2014-09-04: qty 1

## 2014-09-04 MED ORDER — PREDNISONE 10 MG PO TABS
ORAL_TABLET | ORAL | Status: AC
  Filled 2014-09-04: qty 1

## 2014-09-04 MED ORDER — HYDROCHLOROTHIAZIDE 12.5 MG PO CAPS
ORAL_CAPSULE | ORAL | Status: AC
  Filled 2014-09-04: qty 1

## 2014-09-04 MED ORDER — OMEGA-3-ACID ETHYL ESTERS 1 G PO CAPS
ORAL_CAPSULE | ORAL | Status: AC
  Filled 2014-09-04: qty 1

## 2014-09-04 MED ORDER — ASPIRIN EC 325 MG PO TBEC
DELAYED_RELEASE_TABLET | ORAL | Status: AC
  Filled 2014-09-04: qty 1

## 2014-09-04 NOTE — Progress Notes (Signed)
Discharge instruction reviewed with patient. No distress noted. Saline lock removed. Ambulated to lobby with nurse tech.

## 2014-09-04 NOTE — Progress Notes (Signed)
Utilization Review completed.  

## 2014-09-04 NOTE — Discharge Summary (Signed)
Physician Discharge Summary  Monique Wagner ZOX:[id number] DOB: 04-08-1941 DOA: 09/03/2014  PCP: Evlyn CourierHILL,GERALD K, MD  Admit date: 09/03/2014 Discharge date: 09/04/2014  Time spent: 45 minutes  Recommendations for Outpatient Follow-up:  -Will be discharged home today. -Advised to follow up with cardiology in no more than 2 weeks.   Discharge Diagnoses:  Principal Problem:   Chest pain Active Problems:   HTN (hypertension)   Hyperlipidemia   Discharge Condition: Stable and improved  Filed Weights   09/03/14 1148 09/03/14 1641  Weight: 78.926 kg (174 lb) 77.338 kg (170 lb 8 oz)    History of present illness:  Patient is a 73 year old lady with past medical history significant for hypertension, hyperlipidemia, rheumatoid arthritis maintained on chronic prednisone. She presents to the hospital today with complaints of about a 2 week history of precordial chest pain. She has "not paid any attention to it" and hence cannot give me any qualifying factors about her pain, i.e. she does not know if it is related to exertion or to food. When asked why she decided to come to the hospital today she tells me that with the holidays approaching she wanted to make sure that she was okay. She denies shortness of breath, headache, palpitations, lightheadedness, dizziness. She tells me that about 15 years ago she saw Dr. Elsie LincolnGamble for chest pain and she had a cardiac catheterization that was reported as "normal"; however this predates Epic and I do not have a way of retrieving this record. We have been asked to admit her for further evaluation and management.  Hospital Course:   Chest Pain -Ruled out for ACS with negative troponins and EKGs without acute ischemic abnormalities. -CP had some typical factors including being relieved with nitro.-Will request follow up as an OP with cardiology for consideration of a stress test (per patient had a cath >15 years ago with Dr. Elsie LincolnGamble that was  "normal").  HTN -Well controlled. -Continue home medications.  Procedures:  None   Consultations:  None  Discharge Instructions  Discharge Instructions    Diet - low sodium heart healthy    Complete by:  As directed      Increase activity slowly    Complete by:  As directed             Medication List    STOP taking these medications        triamterene-hydrochlorothiazide 37.5-25 MG per capsule  Commonly known as:  DYAZIDE      TAKE these medications        aspirin 325 MG EC tablet  Take 325 mg by mouth daily.     atorvastatin 20 MG tablet  Commonly known as:  LIPITOR  Take 20 mg by mouth daily.     carvedilol 20 MG 24 hr capsule  Commonly known as:  COREG CR  Take 20 mg by mouth daily.     FISH OIL PO  Take 1 capsule by mouth daily.     losartan-hydrochlorothiazide 50-12.5 MG per tablet  Commonly known as:  HYZAAR  Take 1 tablet by mouth daily.     MULTIVITAMIN PO  Take 1 tablet by mouth daily.     predniSONE 10 MG tablet  Commonly known as:  DELTASONE  Take 1 tablet by mouth daily.       No Known Allergies     Follow-up Information    Follow up with [facility] LPILL,GERALD K, MD. Schedule an appointment as soon as possible for a visit in  2 weeks.   Specialty:  Family Medicine   Contact information:   [address] [place] [address] [phone]       Follow up with Nona DellSamuel McDowell, MD. Schedule an appointment as soon as possible for a visit in 1 week.   Specialty:  Cardiology   Contact information:   [address] [place] [address] [phone]        The results of significant diagnostics from this hospitalization (including imaging, microbiology, ancillary and laboratory) are listed below for reference.    Significant Diagnostic Studies: Dg Chest 2 View  09/03/2014   CLINICAL DATA:  Chest tightness.  EXAM: CHEST  2 VIEW  COMPARISON:  PA and lateral chest 09/22/2007.  FINDINGS: The heart size and mediastinal contours are  within normal limits. Both lungs are clear. The visualized skeletal structures are unremarkable.  IMPRESSION: No active cardiopulmonary disease.   Electronically Signed   By: Drusilla Kannerhomas  Dalessio M.D.   On: 09/03/2014 13:38    Microbiology: No results found for this or any previous visit (from the past 240 hour(s)).   Labs: Basic Metabolic Panel:  Recent Labs Lab 09/03/14 1223 09/04/14 0515  NA 140 140  K 3.4* 3.4*  CL 96 97  CO2 31 29  GLUCOSE 103* 87  BUN 22 22  CREATININE 1.61* 1.20*  CALCIUM 10.6* 9.8   Liver Function Tests: No results for input(s): AST, ALT, ALKPHOS, BILITOT, PROT, ALBUMIN in the last 168 hours. No results for input(s): LIPASE, AMYLASE in the last 168 hours. No results for input(s): AMMONIA in the last 168 hours. CBC:  Recent Labs Lab 09/03/14 1223 09/04/14 0515  WBC 7.3 8.1  NEUTROABS 5.2  --   HGB 12.9 12.3  HCT 39.6 37.4  MCV 88.6 89.0  PLT 211 195   Cardiac Enzymes:  Recent Labs Lab 09/03/14 1223 09/03/14 1851 09/03/14 2324 09/04/14 0515  TROPONINI <0.30 <0.30 <0.30 <0.30   BNP: BNP (last 3 results) No results for input(s): PROBNP in the last 8760 hours. CBG: No results for input(s): GLUCAP in the last 168 hours.     SignedChaya Jan:  HERNANDEZ ACOSTA,ESTELA  [facility] Pager: [phone] 09/04/2014, 3:13 PM

## 2014-10-20 DIAGNOSIS — R799 Abnormal finding of blood chemistry, unspecified: Secondary | ICD-10-CM | POA: Diagnosis not present

## 2014-10-20 DIAGNOSIS — Z7952 Long term (current) use of systemic steroids: Secondary | ICD-10-CM | POA: Diagnosis not present

## 2014-10-20 DIAGNOSIS — M25442 Effusion, left hand: Secondary | ICD-10-CM | POA: Diagnosis not present

## 2014-10-20 DIAGNOSIS — M25441 Effusion, right hand: Secondary | ICD-10-CM | POA: Diagnosis not present

## 2014-10-27 DIAGNOSIS — M06841 Other specified rheumatoid arthritis, right hand: Secondary | ICD-10-CM | POA: Diagnosis not present

## 2014-10-27 DIAGNOSIS — R079 Chest pain, unspecified: Secondary | ICD-10-CM | POA: Diagnosis not present

## 2014-10-27 DIAGNOSIS — E785 Hyperlipidemia, unspecified: Secondary | ICD-10-CM | POA: Diagnosis not present

## 2014-10-28 DIAGNOSIS — Z961 Presence of intraocular lens: Secondary | ICD-10-CM | POA: Diagnosis not present

## 2014-12-17 DIAGNOSIS — I10 Essential (primary) hypertension: Secondary | ICD-10-CM | POA: Diagnosis not present

## 2014-12-17 DIAGNOSIS — R0789 Other chest pain: Secondary | ICD-10-CM | POA: Diagnosis not present

## 2015-04-11 ENCOUNTER — Other Ambulatory Visit (HOSPITAL_COMMUNITY): Payer: Self-pay | Admitting: Family Medicine

## 2015-04-11 DIAGNOSIS — Z1231 Encounter for screening mammogram for malignant neoplasm of breast: Secondary | ICD-10-CM

## 2015-05-06 ENCOUNTER — Ambulatory Visit (HOSPITAL_COMMUNITY)
Admission: RE | Admit: 2015-05-06 | Discharge: 2015-05-06 | Disposition: A | Discharge status: Home / Self Care | Payer: Medicare Other | Source: Ambulatory Visit | Attending: Family Medicine | Admitting: Family Medicine

## 2015-05-06 DIAGNOSIS — Z1231 Encounter for screening mammogram for malignant neoplasm of breast: Secondary | ICD-10-CM | POA: Diagnosis not present

## 2015-11-08 DIAGNOSIS — J069 Acute upper respiratory infection, unspecified: Secondary | ICD-10-CM | POA: Diagnosis not present

## 2015-11-08 DIAGNOSIS — J029 Acute pharyngitis, unspecified: Secondary | ICD-10-CM | POA: Diagnosis not present

## 2015-11-08 DIAGNOSIS — J40 Bronchitis, not specified as acute or chronic: Secondary | ICD-10-CM | POA: Diagnosis not present

## 2016-04-13 ENCOUNTER — Other Ambulatory Visit (HOSPITAL_COMMUNITY): Payer: Self-pay | Admitting: Family Medicine

## 2016-04-13 DIAGNOSIS — Z1231 Encounter for screening mammogram for malignant neoplasm of breast: Secondary | ICD-10-CM

## 2016-05-07 ENCOUNTER — Ambulatory Visit (HOSPITAL_COMMUNITY)
Admission: RE | Admit: 2016-05-07 | Discharge: 2016-05-07 | Disposition: A | Discharge status: Home / Self Care | Payer: Medicare Other | Source: Ambulatory Visit | Attending: Family Medicine | Admitting: Family Medicine

## 2016-05-07 DIAGNOSIS — Z1231 Encounter for screening mammogram for malignant neoplasm of breast: Secondary | ICD-10-CM

## 2016-06-20 ENCOUNTER — Ambulatory Visit (HOSPITAL_COMMUNITY): Payer: Medicare Other | Attending: Rheumatology

## 2016-06-20 DIAGNOSIS — M25561 Pain in right knee: Secondary | ICD-10-CM | POA: Diagnosis not present

## 2016-06-20 DIAGNOSIS — M6281 Muscle weakness (generalized): Secondary | ICD-10-CM | POA: Insufficient documentation

## 2016-06-20 DIAGNOSIS — M25662 Stiffness of left knee, not elsewhere classified: Secondary | ICD-10-CM | POA: Diagnosis present

## 2016-06-20 DIAGNOSIS — M25562 Pain in left knee: Secondary | ICD-10-CM

## 2016-06-20 DIAGNOSIS — M25661 Stiffness of right knee, not elsewhere classified: Secondary | ICD-10-CM | POA: Diagnosis present

## 2016-06-20 NOTE — Therapy (Signed)
[facility] [address] [place], [place], [address] Phone: [phone]   Fax:  [phone]  Physical Therapy Evaluation  Patient Details  Name: Monique Wagner MRN: [id number] Date of Birth: [birth year] Referring Provider: Stacey DrainWilliam Truslow  Encounter Date: 06/20/2016      PT End of Session - 06/20/16 2154    Visit Number 1   Number of Visits 16   Date for PT Re-Evaluation 07/20/16   Authorization Type BCBS Medicare   Authorization Time Period 06/20/16-08/20/16   Authorization - Visit Number 1   PT Start Time 1520   PT Stop Time 1600   PT Time Calculation (min) 40 min   Activity Tolerance Patient tolerated treatment well;Patient limited by fatigue   Behavior [facility] for tasks assessed/performed      Past Medical History:  Diagnosis Date  . Arthritis   . HTN (hypertension)   . Hyperlipidemia     Past Surgical History:  Procedure Laterality Date  . ABDOMINAL HYSTERECTOMY      There were no vitals filed for this visit.       Subjective Assessment - 06/20/16 1522    Subjective Pt reports she has had knee pain bil for about 10-15 years, it hasgotten worse recently. She has been followed by rheumatology who would like her to try PT first, rather than rushing to surgical options.    How long can you sit comfortably? Not limited    How long can you stand comfortably? Not limited    How long can you walk comfortably? Not limited   Limited in her ability to come up from floor and squatting.   Patient Stated Goals Reduce pain and stiffness so she can jump rope or line dance.    Currently in Pain? No/denies            [facility] PT Assessment - 06/20/16 0001      Assessment   Medical Diagnosis bilat knee OA   Referring Provider Stacey DrainWilliam Truslow   Onset Date/Surgical Date --  ~10-15YA   Hand Dominance Right   Next MD Visit September 13, 2016     Precautions   Precautions None     Restrictions   Weight Bearing  Restrictions No     Balance Screen   Has the patient fallen in the past 6 months No  "a couple stumbles"   Has the patient had a decrease in activity level because of a fear of falling?  No   Is the patient reluctant to leave their home because of a fear of falling?  No     Prior Function   Level of Independence Independent  Still drives and makes groceries, but she 'takes her time'     Observation/Other Assessments-Edema    Edema Circumferential     Circumferential Edema   Circumferential - Right 47cm- Knee   Circumferential - Left  43cm- Knee     ROM / Strength   AROM / PROM / Strength PROM     PROM   PROM Assessment Site Knee;Ankle   Right/Left Knee Right;Left   Right Knee Extension 14   Right Knee Flexion 89   Left Knee Extension 11   Left Knee Flexion 100   Right/Left Ankle Right;Left   Right Ankle Dorsiflexion 13   Left Ankle Dorsiflexion 15     Flexibility   Soft Tissue Assessment /Muscle Length yes   Hamstrings R: 163 degrees SLR; L: 168 degrees SLR  Transfers   Five time sit to stand comments  5xSTS: 21s     Ambulation/Gait   Ambulation/Gait Yes   Ambulation Distance (Feet) 675 Feet  3 laps timed (190.45s)   Assistive device None   Ambulation Surface Level;Indoor   Gait velocity 1.75m/s   Gait Comments 6/10 DOE after test     Balance   Balance Assessed Yes     High Level Balance   High Level Balance Comments SLS: R: 26.6s; L: 5.26s                   [facility] Adult PT Treatment/Exercise - 06/20/16 0001      Exercises   Exercises Knee/Hip     Knee/Hip Exercises: Seated   Long Arc Quad Left;Right;AAROM;1 set;10 reps  HEP education     Knee/Hip Exercises: Supine   Heel Slides 15 reps  15x5secH (HEP)   Bridges 10 reps;Both  HEP   Straight Leg Raises Right;Left;1 set;10 reps  HEP education                  PT Short Term Goals - 06/20/16 2204      PT SHORT TERM GOAL #1   Title After 2 weeks patient will demonstrate  independence and compliance with HEP to improve joint range and strength in bilat knees.    Status New     PT SHORT TERM GOAL #2   Title After 4 weeks patient will demonstrate improved functional mobility with 5xSTS<16s and Gait speed >1.3m/s to improve access to community.    Status New     PT SHORT TERM GOAL #3   Title After 4 weeks pt will demonstrate knee circumference <35cm on R and Knee range of 8-110 bilat to improve performance of stairs.    Status New           PT Long Term Goals - 06/20/16 2207      PT LONG TERM GOAL #1   Title After 7 weeks patient will demonstrate improved functional mobility with 5xSTS<13s and Gait speed >1.20 m/s to improve access to community.    Status New     PT LONG TERM GOAL #2   Title After 7 weeks pt will demonstrate knee circumference <34cm on R and Knee range of 5-115 bilat to allow squatting to ground.    Status New     PT LONG TERM GOAL #3   Title After 7 weeks patient will demonstrate improved balance with SLS >30s bilat to decrease falls risk in home.    Status New               Plan - 06/20/16 2156    Clinical Impression Statement Pt presenting with c/o acute on chronic bilat knee pain related to OA. Pt demonstrates joint perfusion/edema, loss of ROM, decreased functional strength, impaired balance, and decreased tolerance to community distances and community AMB speed. Pt will benefit from skilled PT intervention to address these deficits in order to improve function in ADL, IADL, and leisure activies.    Rehab Potential Good   Clinical Impairments Affecting Rehab Potential chonicity of impairment   PT Frequency 2x / week   PT Duration 8 weeks   PT Treatment/Interventions ADLs/Self Care Home Management;Electrical Stimulation;Moist Heat;Cryotherapy;Therapeutic exercise;Therapeutic activities;Functional mobility training;Balance training;Gait training;Stair training;Patient/family education   PT Next Visit Plan Review  treatment goals; review HEP exercises; prone knee flexion; joint mobilization to knees bilat.    PT Home Exercise Plan 06/20/16 (SLR, LAQ, [place],  Heel Slides)   Consulted and Agree with Plan of Care Patient      Patient will benefit from skilled therapeutic intervention in order to improve the following deficits and impairments:  Abnormal gait, Cardiopulmonary status limiting activity, Decreased activity tolerance, Decreased balance, Decreased endurance, Decreased range of motion, Decreased strength, Hypomobility, Increased edema, Impaired flexibility, Postural dysfunction, Pain  Visit Diagnosis: Pain in right knee - Plan: PT plan of care cert/re-cert  Pain in left knee - Plan: PT plan of care cert/re-cert  Muscle weakness (generalized) - Plan: PT plan of care cert/re-cert  Stiffness of right knee, not elsewhere classified - Plan: PT plan of care cert/re-cert  Stiffness of left knee, not elsewhere classified - Plan: PT plan of care cert/re-cert      G-Codes - June 28, 2016 Feb 14, 2210    Functional Assessment Tool Used Clinical Judgment    Functional Limitation Mobility: Walking and moving around   Mobility: Walking and Moving Around Current Status [phone]) At least 20 percent but less than 40 percent impaired, limited or restricted   Mobility: Walking and Moving Around Goal Status [phone]) At least 1 percent but less than 20 percent impaired, limited or restricted       Problem List Patient Active Problem List   Diagnosis Date Noted  . Pain in the chest   . Chest pain 09/03/2014  . HTN (hypertension) 09/03/2014  . Hyperlipidemia 09/03/2014  . Pain in joint, shoulder region 07/10/2012  . 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  . Osteoarthrosis, unspecified whether generalized or localized, lower leg 04/21/2008  . KNEE PAIN 04/21/2008  . HIGH BLOOD PRESSURE 04/20/2008   10:14 PM, 2016-06-28 Rosamaria Lints, PT, DPT Physical Therapist at [facility] [phone] (office)     [facility] [address] [place], [place], [address] Phone: [phone]   Fax:  [phone]  Name: Monique Wagner MRN: [id number] Date of Birth: 1941/03/25

## 2016-06-27 ENCOUNTER — Ambulatory Visit (HOSPITAL_COMMUNITY): Payer: Medicare Other

## 2016-06-27 DIAGNOSIS — M25561 Pain in right knee: Secondary | ICD-10-CM

## 2016-06-27 DIAGNOSIS — M6281 Muscle weakness (generalized): Secondary | ICD-10-CM

## 2016-06-27 DIAGNOSIS — M25662 Stiffness of left knee, not elsewhere classified: Secondary | ICD-10-CM

## 2016-06-27 DIAGNOSIS — M25661 Stiffness of right knee, not elsewhere classified: Secondary | ICD-10-CM

## 2016-06-27 DIAGNOSIS — M25562 Pain in left knee: Secondary | ICD-10-CM

## 2016-06-27 NOTE — Therapy (Signed)
[facility] [address] [place], [place], [address] Phone: [phone]   Fax:  [phone]  Physical Therapy Treatment  Patient Details  Name: Monique RadarMarian T Stair MRN: [id number] Date of Birth: 1941-02-06 Referring Provider: Stacey DrainWilliam Truslow  Encounter Date: 06/27/2016      PT End of Session - 06/27/16 1354    Visit Number 2   Number of Visits 16   Date for PT Re-Evaluation 07/20/16   Authorization Type BCBS Medicare   Authorization Time Period 06/20/16-08/20/16   PT Start Time 1349   PT Stop Time 1432   PT Time Calculation (min) 43 min   Activity Tolerance Patient tolerated treatment well;Patient limited by fatigue   Behavior During Therapy [facility] for tasks assessed/performed      Past Medical History:  Diagnosis Date  . Arthritis   . HTN (hypertension)   . Hyperlipidemia     Past Surgical History:  Procedure Laterality Date  . ABDOMINAL HYSTERECTOMY      There were no vitals filed for this visit.      Subjective Assessment - 06/27/16 1352    Subjective Pt stated she has no current pain, reports she does have pain with stairs, reports she been trying to walk more though does get tire, was able to walk to her brothers house yesterday.  Reports compliance with HEP without questions concerning.     Patient Stated Goals Reduce pain and stiffness so she can jump rope or line dance.    Currently in Pain? No/denies              [facility] Adult PT Treatment/Exercise - 06/27/16 0001      Knee/Hip Exercises: Stretches   Active Hamstring Stretch Both;2 reps;30 seconds   Active Hamstring Stretch Limitations supine with rope     Knee/Hip Exercises: Supine   Quad Sets Limitations incorporated with heel slide   Short Arc Quad Sets 15 reps   Heel Slides Both;10 reps  cueing for proper technique (incorporate quad set with exten   Bridges 2 sets;Both;10 reps  arms crossed across chest to keep from arms pushing mat   Straight Leg  Raises Right;Left;1 set;10 reps     Knee/Hip Exercises: Prone   Hamstring Curl 15 reps     Manual Therapy   Manual Therapy Joint mobilization   Manual therapy comments Manual complete separate rest of tx   Joint Mobilization patella mobs, tib/fib                 PT Education - 06/27/16 1401    Education provided Yes   Education Details Reviewed goals, assured correct form and technqiue with HEP and copy of eval given to pt/   Person(s) Educated Patient   Methods Explanation;Demonstration;Verbal cues;Handout   Comprehension Verbalized understanding;Returned demonstration;Need further instruction          PT Short Term Goals - 06/20/16 2204      PT SHORT TERM GOAL #1   Title After 2 weeks patient will demonstrate independence and compliance with HEP to improve joint range and strength in bilat knees.    Status New     PT SHORT TERM GOAL #2   Title After 4 weeks patient will demonstrate improved functional mobility with 5xSTS<16s and Gait speed >1.4447m/s to improve access to community.    Status New     PT SHORT TERM GOAL #3   Title After 4 weeks pt will demonstrate knee circumference <35cm on R and Knee range of  8-110 bilat to improve performance of stairs.    Status New           PT Long Term Goals - 06/20/16 2207      PT LONG TERM GOAL #1   Title After 7 weeks patient will demonstrate improved functional mobility with 5xSTS<13s and Gait speed >1.20 m/s to improve access to community.    Status New     PT LONG TERM GOAL #2   Title After 7 weeks pt will demonstrate knee circumference <34cm on R and Knee range of 5-115 bilat to allow squatting to ground.    Status New     PT LONG TERM GOAL #3   Title After 7 weeks patient will demonstrate improved balance with SLS >30s bilat to decrease falls risk in home.    Status New               Plan - 06/27/16 1407    Clinical Impression Statement Reviewed goals, assured correct form and technique with HEP  and copy of eval given to pt.  Session focus on improving knee mobiltiy and strengthening with therex and manual technqiues.  Pt able to complete all therex wtih min cueing for form prior exercise, tendency to ER both hips and cueing required to improve form prior exercise.  No reports of pain throigh session.     Rehab Potential Good   Clinical Impairments Affecting Rehab Potential chonicity of impairment   PT Frequency 2x / week   PT Duration 8 weeks   PT Treatment/Interventions ADLs/Self Care Home Management;Electrical Stimulation;Moist Heat;Cryotherapy;Therapeutic exercise;Therapeutic activities;Functional mobility training;Balance training;Gait training;Stair training;Patient/family education   PT Next Visit Plan Next session focus on improving ROM with therex, add stretches next session to improve knee mobiltiy (slant board and knee drives, etc...),  joint mobs to Bil knees.   PT Home Exercise Plan 06/20/16 (SLR, LAQ, Bridge, Heel Slides)      Patient will benefit from skilled therapeutic intervention in order to improve the following deficits and impairments:  Abnormal gait, Cardiopulmonary status limiting activity, Decreased activity tolerance, Decreased balance, Decreased endurance, Decreased range of motion, Decreased strength, Hypomobility, Increased edema, Impaired flexibility, Postural dysfunction, Pain  Visit Diagnosis: Pain in right knee  Pain in left knee  Muscle weakness (generalized)  Stiffness of right knee, not elsewhere classified  Stiffness of left knee, not elsewhere classified     Problem List Patient Active Problem List   Diagnosis Date Noted  . Pain in the chest   . Chest pain 09/03/2014  . HTN (hypertension) 09/03/2014  . Hyperlipidemia 09/03/2014  . Pain in joint, shoulder region 07/10/2012  . 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  . Osteoarthrosis, unspecified whether generalized or localized, lower leg 04/21/2008  . KNEE PAIN 04/21/2008  . HIGH  BLOOD PRESSURE 04/20/2008   Becky Sax, LPTA; CBIS [id number]  Juel Burrow 06/27/2016, 2:36 PM  [facility] [address] [place], [place], [address] Phone: [phone]   Fax:  [phone]  Name: LYNSI DOONER MRN: [id number] Date of Birth: 11/13/1940

## 2016-07-03 ENCOUNTER — Ambulatory Visit (HOSPITAL_COMMUNITY): Payer: Medicare Other | Attending: Rheumatology | Admitting: Physical Therapy

## 2016-07-03 DIAGNOSIS — M25662 Stiffness of left knee, not elsewhere classified: Secondary | ICD-10-CM

## 2016-07-03 DIAGNOSIS — M25562 Pain in left knee: Secondary | ICD-10-CM

## 2016-07-03 DIAGNOSIS — M25661 Stiffness of right knee, not elsewhere classified: Secondary | ICD-10-CM

## 2016-07-03 DIAGNOSIS — M6281 Muscle weakness (generalized): Secondary | ICD-10-CM | POA: Diagnosis present

## 2016-07-03 DIAGNOSIS — M25561 Pain in right knee: Secondary | ICD-10-CM | POA: Diagnosis not present

## 2016-07-03 NOTE — Therapy (Signed)
[place] [facility] [address] [place], [place], [address] Phone: [phone]   Fax:  [phone]  Physical Therapy Treatment  Patient Details  Name: Monique Wagner MRN: [id number] Date of Birth: 1941-05-19 Referring Provider: Stacey Drain  Encounter Date: 07/03/2016      PT End of Session - 07/03/16 1523    Visit Number 3   Number of Visits 16   Date for PT Re-Evaluation 07/20/16   Authorization Type BCBS Medicare   Authorization Time Period 06/20/16-08/20/16   Authorization - Visit Number 3   Authorization - Number of Visits 10   PT Start Time 1350   PT Stop Time 1428   PT Time Calculation (min) 38 min   Activity Tolerance Patient tolerated treatment well   Behavior During Therapy [facility] for tasks assessed/performed      Past Medical History:  Diagnosis Date  . Arthritis   . HTN (hypertension)   . Hyperlipidemia     Past Surgical History:  Procedure Laterality Date  . ABDOMINAL HYSTERECTOMY      There were no vitals filed for this visit.      Subjective Assessment - 07/03/16 1354    Subjective Patient arrives today stating she is doing OK, no major changes since last session    Patient Stated Goals Reduce pain and stiffness so she can jump rope or line dance.    Currently in Pain? No/denies            [facility] PT Assessment - 07/03/16 0001      AROM   Right Knee Extension 10   Right Knee Flexion 90   Left Knee Extension 1   Left Knee Flexion 90                     [facility] Adult PT Treatment/Exercise - 07/03/16 0001      Knee/Hip Exercises: Stretches   Active Hamstring Stretch Both;2 reps;30 seconds   Active Hamstring Stretch Limitations standing, 12 inch box    Knee: Self-Stretch to increase Flexion Both   Knee: Self-Stretch Limitations 10 reps, 5 second holds    Theme park manager Both;3 reps;30 seconds   Gastroc Stretch Limitations slantboard      Knee/Hip Exercises: Seated   Hamstring Curl  Both;1 set;10 reps   Hamstring Limitations AAROM, 2 second holds      Knee/Hip Exercises: Supine   Quad Sets Both;1 set;15 reps   Quad Sets Limitations 3 second holds    Short Arc [employer] 15 reps   Bridges 2 sets;15 reps     Manual Therapy   Manual Therapy Joint mobilization   Manual therapy comments Manual complete separate rest of tx   Joint Mobilization tibia on femur B knee flexion mobilizatoin                 PT Education - 07/03/16 1523    Education provided No          PT Short Term Goals - 06/20/16 2204      PT SHORT TERM GOAL #1   Title After 2 weeks patient will demonstrate independence and compliance with HEP to improve joint range and strength in bilat knees.    Status New     PT SHORT TERM GOAL #2   Title After 4 weeks patient will demonstrate improved functional mobility with 5xSTS<16s and Gait speed >1.66m/s to improve access to community.    Status New     PT SHORT TERM  GOAL #3   Title After 4 weeks pt will demonstrate knee circumference <35cm on R and Knee range of 8-110 bilat to improve performance of stairs.    Status New           PT Long Term Goals - 06/20/16 2207      PT LONG TERM GOAL #1   Title After 7 weeks patient will demonstrate improved functional mobility with 5xSTS<13s and Gait speed >1.20 m/s to improve access to community.    Status New     PT LONG TERM GOAL #2   Title After 7 weeks pt will demonstrate knee circumference <34cm on R and Knee range of 5-115 bilat to allow squatting to ground.    Status New     PT LONG TERM GOAL #3   Title After 7 weeks patient will demonstrate improved balance with SLS >30s bilat to decrease falls risk in home.    Status New               Plan - 07/03/16 1523    Clinical Impression Statement Continued focus on bilateral knee ROM as indicated in prior treatment plan; began session with functional stretching techniques and then proceeded to perform ther-ex based ROM activities  such as quad sets/SAQs/etc. Did take measures on patient's bilateral knee ROM today, noting bilateral flexion limitations and greater extension limitation on the R. Cues provided for correct exercise form throughout session.    Rehab Potential Good   Clinical Impairments Affecting Rehab Potential chonicity of impairment   PT Frequency 2x / week   PT Duration 8 weeks   PT Treatment/Interventions ADLs/Self Care Home Management;Electrical Stimulation;Moist Heat;Cryotherapy;Therapeutic exercise;Therapeutic activities;Functional mobility training;Balance training;Gait training;Stair training;Patient/family education   PT Next Visit Plan Next session focus on improving ROM with therex, add stretches next session to improve knee mobiltiy (slant board and knee drives, etc...),  joint mobs to Bil knees.   PT Home Exercise Plan 06/20/16 (SLR, LAQ, Bridge, Heel Slides)   Consulted and Agree with Plan of Care Patient      Patient will benefit from skilled therapeutic intervention in order to improve the following deficits and impairments:  Abnormal gait, Cardiopulmonary status limiting activity, Decreased activity tolerance, Decreased balance, Decreased endurance, Decreased range of motion, Decreased strength, Hypomobility, Increased edema, Impaired flexibility, Postural dysfunction, Pain  Visit Diagnosis: Right knee pain, unspecified chronicity  Left knee pain, unspecified chronicity  Muscle weakness (generalized)  Stiffness of right knee, not elsewhere classified  Stiffness of left knee, not elsewhere classified     Problem List Patient Active Problem List   Diagnosis Date Noted  . Pain in the chest   . Chest pain 09/03/2014  . HTN (hypertension) 09/03/2014  . Hyperlipidemia 09/03/2014  . Pain in joint, shoulder region 07/10/2012  . 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  . Osteoarthrosis, unspecified whether generalized or localized, lower leg 04/21/2008  . KNEE PAIN 04/21/2008  . HIGH  BLOOD PRESSURE 04/20/2008    Nedra HaiKristen Niquita Digioia PT, DPT [phone]  [facility] [address] [place], [place], [address] Phone: [phone]   Fax:  [phone]  Name: Monique Wagner MRN: [id number] Date of Birth: [birth year]

## 2016-07-05 ENCOUNTER — Ambulatory Visit (HOSPITAL_COMMUNITY): Payer: Medicare Other

## 2016-07-05 DIAGNOSIS — M25561 Pain in right knee: Secondary | ICD-10-CM | POA: Diagnosis not present

## 2016-07-05 DIAGNOSIS — M25562 Pain in left knee: Secondary | ICD-10-CM

## 2016-07-05 DIAGNOSIS — M25661 Stiffness of right knee, not elsewhere classified: Secondary | ICD-10-CM

## 2016-07-05 DIAGNOSIS — M6281 Muscle weakness (generalized): Secondary | ICD-10-CM

## 2016-07-05 DIAGNOSIS — M25662 Stiffness of left knee, not elsewhere classified: Secondary | ICD-10-CM

## 2016-07-05 NOTE — Therapy (Signed)
[place] [address] [place], [place], [address] Phone: [phone]   Fax:  [phone]  Physical Therapy Treatment  Patient Details  Name: Monique Wagner MRN: [id number] Date of Birth: Aug 08, 1941 Referring Provider: Stacey DrainWilliam Truslow  Encounter Date: 07/05/2016      PT End of Session - 07/05/16 1142    Visit Number 4   Number of Visits 16   Date for PT Re-Evaluation 07/20/16   Authorization Type BCBS Medicare   Authorization Time Period 06/20/16-08/20/16   Authorization - Visit Number 4   Authorization - Number of Visits 10   PT Start Time 1117   PT Stop Time 1200   PT Time Calculation (min) 43 min   Activity Tolerance Patient tolerated treatment well   Behavior During Therapy [facility] for tasks assessed/performed      Past Medical History:  Diagnosis Date  . Arthritis   . HTN (hypertension)   . Hyperlipidemia     Past Surgical History:  Procedure Laterality Date  . ABDOMINAL HYSTERECTOMY      There were no vitals filed for this visit.      Subjective Assessment - 07/05/16 1117    Subjective Pt stated her knees are doing good today, no reports of pain.   Patient Stated Goals Reduce pain and stiffness so she can jump rope or line dance.    Currently in Pain? No/denies           [facility] Adult PT Treatment/Exercise - 07/05/16 0001      Knee/Hip Exercises: Stretches   Active Hamstring Stretch Both;2 reps;30 seconds   Active Hamstring Stretch Limitations standing, 12 inch box    Quad Stretch 3 reps;30 seconds   Quad Stretch Limitations prone with rope   Knee: Self-Stretch to increase Flexion Both   Knee: Self-Stretch Limitations 10 reps, 5 second holds    Theme park managerGastroc Stretch Both;3 reps;30 seconds   Gastroc Stretch Limitations slantboard      Knee/Hip Exercises: Standing   Other Standing Knee Exercises 6 in then 12in hurdles to improve knee flexion     Knee/Hip Exercises: Supine   Quad Sets Both;1 set;15 reps   Short Arc Quad Sets 15 reps     Knee/Hip Exercises: Prone   Hamstring Curl 15 reps     Manual Therapy   Manual Therapy Joint mobilization   Manual therapy comments Manual complete separate rest of tx   Joint Mobilization tibia on femur B knee flexion mobilization; patella mobs all directions                  PT Short Term Goals - 06/20/16 2204      PT SHORT TERM GOAL #1   Title After 2 weeks patient will demonstrate independence and compliance with HEP to improve joint range and strength in bilat knees.    Status New     PT SHORT TERM GOAL #2   Title After 4 weeks patient will demonstrate improved functional mobility with 5xSTS<16s and Gait speed >1.7381m/Wagner to improve access to community.    Status New     PT SHORT TERM GOAL #3   Title After 4 weeks pt will demonstrate knee circumference <35cm on R and Knee range of 8-110 bilat to improve performance of stairs.    Status New           PT Long Term Goals - 06/20/16 2207      PT LONG TERM GOAL #1   Title After 7  weeks patient will demonstrate improved functional mobility with 5xSTS<13s and Gait speed >1.20 m/Wagner to improve access to community.    Status New     PT LONG TERM GOAL #2   Title After 7 weeks pt will demonstrate knee circumference <34cm on R and Knee range of 5-115 bilat to allow squatting to ground.    Status New     PT LONG TERM GOAL #3   Title After 7 weeks patient will demonstrate improved balance with SLS >30s bilat to decrease falls risk in home.    Status New               Plan - 07/05/16 1143    Clinical Impression Statement Session focus on improving Bil knee mobility with therex including functional stretches and quad strengthening exercises to improve knee extension and manual techniques to improve joint mobility.  Therapist facilitation through session for proper form and technique.  Added hurdles to improve functional knee flexion to simulate tasks like getting into bathtub, curbs and  getting into car; pt unable to clear 12in hurdles due to limited ROM.  No reports of pain through session.   Rehab Potential Good   Clinical Impairments Affecting Rehab Potential chonicity of impairment   PT Frequency 2x / week   PT Duration 8 weeks   PT Treatment/Interventions ADLs/Self Care Home Management;Electrical Stimulation;Moist Heat;Cryotherapy;Therapeutic exercise;Therapeutic activities;Functional mobility training;Balance training;Gait training;Stair training;Patient/family education   PT Next Visit Plan Next session focus on improving ROM with therex, add stretches next session to improve knee mobiltiy (slant board and knee drives, etc...),  joint mobs to Bil knees.   PT Home Exercise Plan 06/20/16 (SLR, LAQ, Bridge, Heel Slides)      Patient will benefit from skilled therapeutic intervention in order to improve the following deficits and impairments:  Abnormal gait, Cardiopulmonary status limiting activity, Decreased activity tolerance, Decreased balance, Decreased endurance, Decreased range of motion, Decreased strength, Hypomobility, Increased edema, Impaired flexibility, Postural dysfunction, Pain  Visit Diagnosis: Right knee pain, unspecified chronicity  Left knee pain, unspecified chronicity  Muscle weakness (generalized)  Stiffness of right knee, not elsewhere classified  Stiffness of left knee, not elsewhere classified     Problem List Patient Active Problem List   Diagnosis Date Noted  . Pain in the chest   . Chest pain 09/03/2014  . HTN (hypertension) 09/03/2014  . Hyperlipidemia 09/03/2014  . Pain in joint, shoulder region 07/10/2012  . 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  . Osteoarthrosis, unspecified whether generalized or localized, lower leg 04/21/2008  . KNEE PAIN 04/21/2008  . HIGH BLOOD PRESSURE 04/20/2008   Monique Wagner, LPTA; CBIS [phone]  Juel Burrow 07/05/2016, 12:03 PM  [place] [facility] [address] [place], [place], [address] Phone: [phone]   Fax:  [phone]  Name: Monique SEIF MRN: [id number] Date of Birth: 1941-01-15

## 2016-07-10 ENCOUNTER — Ambulatory Visit (HOSPITAL_COMMUNITY): Payer: Medicare Other | Admitting: Physical Therapy

## 2016-07-10 DIAGNOSIS — M25662 Stiffness of left knee, not elsewhere classified: Secondary | ICD-10-CM

## 2016-07-10 DIAGNOSIS — M25661 Stiffness of right knee, not elsewhere classified: Secondary | ICD-10-CM

## 2016-07-10 DIAGNOSIS — M25561 Pain in right knee: Secondary | ICD-10-CM

## 2016-07-10 DIAGNOSIS — M6281 Muscle weakness (generalized): Secondary | ICD-10-CM

## 2016-07-10 DIAGNOSIS — M25562 Pain in left knee: Secondary | ICD-10-CM

## 2016-07-10 NOTE — Therapy (Signed)
[place] [facility] [address] [place], [place], [address] Phone: [phone]   Fax:  [phone]  Physical Therapy Treatment  Patient Details  Name: Monique Wagner MRN: [id number] Date of Birth: 12-28-1940 Referring Provider: Stacey DrainWilliam Truslow  Encounter Date: 07/10/2016      PT End of Session - 07/10/16 1509    Visit Number 5   Number of Visits 16   Date for PT Re-Evaluation 07/20/16   Authorization Type BCBS Medicare   Authorization Time Period 06/20/16-08/20/16   Authorization - Visit Number 5   Authorization - Number of Visits 10   PT Start Time 1437   PT Stop Time 1516   PT Time Calculation (min) 39 min   Activity Tolerance Patient tolerated treatment well   Behavior During Therapy [facility] for tasks assessed/performed      Past Medical History:  Diagnosis Date  . Arthritis   . HTN (hypertension)   . Hyperlipidemia     Past Surgical History:  Procedure Laterality Date  . ABDOMINAL HYSTERECTOMY      There were no vitals filed for this visit.      Subjective Assessment - 07/10/16 1441    Subjective PT states her pain varies in her knees.  doing well today without complaints.   Currently in Pain? No/denies                         [facility] Adult PT Treatment/Exercise - 07/10/16 0001      Knee/Hip Exercises: Stretches   Active Hamstring Stretch Both;2 reps;30 seconds   Active Hamstring Stretch Limitations standing, 12 inch box    Quad Stretch 3 reps;30 seconds   Quad Stretch Limitations prone with rope   Knee: Self-Stretch to increase Flexion Both   Knee: Self-Stretch Limitations 10 reps, 5 second holds    Theme park managerGastroc Stretch Both;3 reps;30 seconds   Gastroc Stretch Limitations slantboard      Knee/Hip Exercises: Standing   Hip Abduction Both;10 reps   Hip Extension Both;10 reps   Other Standing Knee Exercises marching with toe taps on 12" box alternating 15 reps     Knee/Hip Exercises: Supine   Quad Sets  Both;1 set;15 reps   Short Arc [employer]Quad Sets 15 reps   Bridges 2 sets;15 reps     Knee/Hip Exercises: Prone   Hamstring Curl 15 reps                  PT Short Term Goals - 06/20/16 2204      PT SHORT TERM GOAL #1   Title After 2 weeks patient will demonstrate independence and compliance with HEP to improve joint range and strength in bilat knees.    Status New     PT SHORT TERM GOAL #2   Title After 4 weeks patient will demonstrate improved functional mobility with 5xSTS<16s and Gait speed >1.3224m/s to improve access to community.    Status New     PT SHORT TERM GOAL #3   Title After 4 weeks pt will demonstrate knee circumference <35cm on R and Knee range of 8-110 bilat to improve performance of stairs.    Status New           PT Long Term Goals - 06/20/16 2207      PT LONG TERM GOAL #1   Title After 7 weeks patient will demonstrate improved functional mobility with 5xSTS<13s and Gait speed >1.20 m/s to improve access to community.  Status New     PT LONG TERM GOAL #2   Title After 7 weeks pt will demonstrate knee circumference <34cm on R and Knee range of 5-115 bilat to allow squatting to ground.    Status New     PT LONG TERM GOAL #3   Title After 7 weeks patient will demonstrate improved balance with SLS >30s bilat to decrease falls risk in home.    Status New               Plan - 07/10/16 1510    Clinical Impression Statement Continued with focus on improving knee joint mobililty and surrounding muscular strength.  Added standing march to 12" step to work on knee and hip flexion.  Addition of bilateral hip strengthening as well in standing to help improve stability.  Pt able to complete all actvities without c/o pain or difficulty.     Rehab Potential Good   Clinical Impairments Affecting Rehab Potential chonicity of impairment   PT Frequency 2x / week   PT Duration 8 weeks   PT Treatment/Interventions ADLs/Self Care Home Management;Electrical  Stimulation;Moist Heat;Cryotherapy;Therapeutic exercise;Therapeutic activities;Functional mobility training;Balance training;Gait training;Stair training;Patient/family education   PT Next Visit Plan Continue focus on improving overall mobility and functional strength.     PT Home Exercise Plan 06/20/16 (SLR, LAQ, Bridge, Heel Slides)      Patient will benefit from skilled therapeutic intervention in order to improve the following deficits and impairments:  Abnormal gait, Cardiopulmonary status limiting activity, Decreased activity tolerance, Decreased balance, Decreased endurance, Decreased range of motion, Decreased strength, Hypomobility, Increased edema, Impaired flexibility, Postural dysfunction, Pain  Visit Diagnosis: Right knee pain, unspecified chronicity  Left knee pain, unspecified chronicity  Muscle weakness (generalized)  Stiffness of right knee, not elsewhere classified  Stiffness of left knee, not elsewhere classified     Problem List Patient Active Problem List   Diagnosis Date Noted  . Pain in the chest   . Chest pain 09/03/2014  . HTN (hypertension) 09/03/2014  . Hyperlipidemia 09/03/2014  . Pain in joint, shoulder region 07/10/2012  . 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  . Osteoarthrosis, unspecified whether generalized or localized, lower leg 04/21/2008  . KNEE PAIN 04/21/2008  . HIGH BLOOD PRESSURE 04/20/2008    Lurena Nida, PTA/CLT [phone]  07/10/2016, 3:15 PM  [place] [facility] [address] [place], [place], [address] Phone: [phone]   Fax:  [phone]  Name: Monique Wagner MRN: [id number] Date of Birth: 1941/06/22

## 2016-07-12 ENCOUNTER — Ambulatory Visit (HOSPITAL_COMMUNITY): Payer: Medicare Other

## 2016-07-12 DIAGNOSIS — M25562 Pain in left knee: Secondary | ICD-10-CM

## 2016-07-12 DIAGNOSIS — M25561 Pain in right knee: Secondary | ICD-10-CM

## 2016-07-12 DIAGNOSIS — M6281 Muscle weakness (generalized): Secondary | ICD-10-CM

## 2016-07-12 DIAGNOSIS — M25661 Stiffness of right knee, not elsewhere classified: Secondary | ICD-10-CM

## 2016-07-12 DIAGNOSIS — M25662 Stiffness of left knee, not elsewhere classified: Secondary | ICD-10-CM

## 2016-07-12 NOTE — Patient Instructions (Signed)
Functional Quadriceps: Sit to Stand    Sit on edge of chair, feet flat on floor. Stand upright, extending knees fully. Repeat 10 times per set. Do 2 sessions per day.  http://orth.exer.us/735   Copyright  VHI. All rights reserved.

## 2016-07-12 NOTE — Therapy (Signed)
[facility] [address] [place], [place], [address] Phone: [phone]   Fax:  [phone]  Physical Therapy Treatment  Patient Details  Name: Monique RadarMarian T Rosselli MRN: [id number] Date of Birth: 1941-06-12 Referring Provider: Stacey DrainWilliam Truslow  Encounter Date: 07/12/2016      PT End of Session - 07/12/16 1439    Visit Number 6   Number of Visits 16   Date for PT Re-Evaluation 07/20/16   Authorization Type BCBS Medicare   Authorization Time Period 06/20/16-08/20/16   Authorization - Visit Number 6   Authorization - Number of Visits 10   PT Start Time 1435   PT Stop Time 1514   PT Time Calculation (min) 39 min   Activity Tolerance Patient tolerated treatment well   Behavior [facility] Therapy [facility] for tasks assessed/performed      Past Medical History:  Diagnosis Date  . Arthritis   . HTN (hypertension)   . Hyperlipidemia     Past Surgical History:  Procedure Laterality Date  . ABDOMINAL HYSTERECTOMY      There were no vitals filed for this visit.      Subjective Assessment - 07/12/16 1437    Subjective Pt stated she has no pain, Bil knees continue to be stiff.     Patient Stated Goals Reduce pain and stiffness so she can jump rope or line dance.    Currently in Pain? No/denies            [facility] - 07[facility]12[facility]16 0001      Assessment   Medical Diagnosis bilat knee OA   Referring Provider Stacey DrainWilliam Truslow   Onset Date/Surgical Date --  ~10-15 YA   Hand Dominance Right   Next MD Visit September 13, 2016     AROM   Right Knee Extension 8   Right Knee Flexion 97   Left Knee Extension 0   Left Knee Flexion 100                     [facility] Adult PT Treatment/Exercise - 07/12/16 0001      Knee/Hip Exercises: Stretches   Active Hamstring Stretch Both;2 reps;30 seconds   Active Hamstring Stretch Limitations standing, 12 inch box    Quad Stretch 3 reps;30 seconds   Quad Stretch Limitations prone with  rope   Knee: Self-Stretch to increase Flexion Both   Knee: Self-Stretch Limitations 10 reps, 10 second holds    Theme park managerGastroc Stretch Both;3 reps;30 seconds   Gastroc Stretch Limitations slantboard      Knee/Hip Exercises: Standing   Functional Squat 10 reps   Functional Squat Limitations minisquats    Other Standing Knee Exercises marching with toe taps on 12" box alternating 15 reps     Knee/Hip Exercises: Supine   Short Arc Quad Sets 15 reps   Bridges 20 reps     Knee/Hip Exercises: Prone   Hamstring Curl 15 reps                  PT Short Term Goals - 06/20/16 2204      PT SHORT TERM GOAL #1   Title After 2 weeks patient will demonstrate independence and compliance with HEP to improve joint range and strength in bilat knees.    Status New     PT SHORT TERM GOAL #2   Title After 4 weeks patient will demonstrate improved functional mobility with 5xSTS<16s and Gait speed >1.870m/s to improve access to community.  Status New     PT SHORT TERM GOAL #3   Title After 4 weeks pt will demonstrate knee circumference <35cm on R and Knee range of 8-110 bilat to improve performance of stairs.    Status New           PT Long Term Goals - 06/20/16 2207      PT LONG TERM GOAL #1   Title After 7 weeks patient will demonstrate improved functional mobility with 5xSTS<13s and Gait speed >1.20 m/s to improve access to community.    Status New     PT LONG TERM GOAL #2   Title After 7 weeks pt will demonstrate knee circumference <34cm on R and Knee range of 5-115 bilat to allow squatting to ground.    Status New     PT LONG TERM GOAL #3   Title After 7 weeks patient will demonstrate improved balance with SLS >30s bilat to decrease falls risk in home.    Status New               Plan - 07/12/16 1507    Clinical Impression Statement Continued session focus on improving knee joint mobility and proximal musculature strengthening.  Pt making improvements with knee mobility  noted by ROM measurements taken (see above).  Pt able to complete therex with minimal cueing required.  Added mini squats for proximal musculature strengthening and to improve functional knee mobility.     Rehab Potential Good   Clinical Impairments Affecting Rehab Potential chonicity of impairment   PT Frequency 2x / week   PT Duration 8 weeks   PT Treatment/Interventions ADLs/Self Care Home Management;Electrical Stimulation;Moist Heat;Cryotherapy;Therapeutic exercise;Therapeutic activities;Functional mobility training;Balance training;Gait training;Stair training;Patient/family education   PT Next Visit Plan Continue focus on improving overall mobility and functional strength.     PT Home Exercise Plan 06/20/16 (SLR, LAQ, Bridge, Heel Slides); 07/12/2016: controlled STS      Patient will benefit from skilled therapeutic intervention in order to improve the following deficits and impairments:  Abnormal gait, Cardiopulmonary status limiting activity, Decreased activity tolerance, Decreased balance, Decreased endurance, Decreased range of motion, Decreased strength, Hypomobility, Increased edema, Impaired flexibility, Postural dysfunction, Pain  Visit Diagnosis: Right knee pain, unspecified chronicity  Left knee pain, unspecified chronicity  Muscle weakness (generalized)  Stiffness of right knee, not elsewhere classified  Stiffness of left knee, not elsewhere classified     Problem List Patient Active Problem List   Diagnosis Date Noted  . Pain in the chest   . Chest pain 09/03/2014  . HTN (hypertension) 09/03/2014  . Hyperlipidemia 09/03/2014  . Pain in joint, shoulder region 07/10/2012  . 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  . Osteoarthrosis, unspecified whether generalized or localized, lower leg 04/21/2008  . KNEE PAIN 04/21/2008  . HIGH BLOOD PRESSURE 04/20/2008   Becky Sax, LPTA; CBIS [phone]  Juel Burrow 07/12/2016, 4:25 PM  [facility] [address] [place], [place], [address] Phone: [phone]   Fax:  [phone]  Name: Monique [facility] MRN: [id number] Date of Birth: Nov 05, 1940

## 2016-07-17 ENCOUNTER — Ambulatory Visit (HOSPITAL_COMMUNITY): Payer: Medicare Other

## 2016-07-17 DIAGNOSIS — M25561 Pain in right knee: Secondary | ICD-10-CM | POA: Diagnosis not present

## 2016-07-17 DIAGNOSIS — M6281 Muscle weakness (generalized): Secondary | ICD-10-CM

## 2016-07-17 DIAGNOSIS — M25661 Stiffness of right knee, not elsewhere classified: Secondary | ICD-10-CM

## 2016-07-17 DIAGNOSIS — M25562 Pain in left knee: Secondary | ICD-10-CM

## 2016-07-17 NOTE — Therapy (Signed)
[facility] [address] [place], [place], [address] Phone: [phone]   Fax:  [phone]  Physical Therapy Treatment  Patient Details  Name: Monique [facility] MRN: [id number] Date of Birth: 07/20/1941 Referring Provider: William Truslow   Encounter Date: 07/17/2016      PT End of Session - 07/17/16 1542    Visit Number 7   Number of Visits 16   Date for PT Re-Evaluation 08/20/16   Authorization Type BCBS Medicare   Authorization Time Period 06/20/16-08/20/16 (G-codes done on 10/17)   Authorization - Visit Number 7   Authorization - Number of Visits 17   PT Start Time 1432   PT Stop Time 1515   PT Time Calculation (min) 43 min   Activity Tolerance Patient tolerated treatment well;Patient limited by pain   Behavior During Therapy WFL for tasks assessed/performed      Past Medical History:  Diagnosis Date  . Arthritis   . HTN (hypertension)   . Hyperlipidemia     Past Surgical History:  Procedure Laterality Date  . ABDOMINAL HYSTERECTOMY      There were no vitals filed for this visit.      Subjective Assessment - 07/17/16 1438    Subjective Pt reports she in unable to tell a big difference. She says it is sometmes better, and sometimes worse. She continues to work on her HEP daily and really enjoys coming to therapy adn getting stronger. Functional activities are also about the same.    How long can you sit comfortably? Not limited (was not limited at evaluation)    How long can you stand comfortably? Not limited (was not limited at evaluation)    How long can you walk comfortably? Not limited (was not limited at evaluation) Squatting and coming off the floor are both limited as they were a evaluation.    Patient Stated Goals Reduce pain and stiffness so she can jump rope or line dance.    Currently in Pain? No/denies            [facility] PT Assessment - 07/17/16 0001      Assessment   Medical Diagnosis bilat knee OA    Referring Provider William Truslow    Onset Date/Surgical Date --  ~10-15 YA   Hand Dominance Right   Next MD Visit September 13, 2016     Balance Screen   Has the patient fallen in the past 6 months --  No falls since therapy started, none in prior 6months     Prior Function   Level of Independence Independent  Still drives and makes groceries, but she 'takes her time'     Observation/Other Assessments-Edema    Edema Circumferential     Circumferential Edema   Circumferential - Right 45.8cm (decreased)  47cm at evaluation   Circumferential - Left  43.6cm (same)   43cm at evaluation     ROM / Strength   AROM / PROM / Strength PROM     PROM   Right Knee Extension 10  14 degrees at evaluation   Right Knee Flexion 96  89 degrees at evaluation   Left Knee Extension 8  11 degree at evaluation   Left Knee Flexion 104  100 at evaluation   Right Ankle Dorsiflexion 20  13 at evaluation    Left Ankle Dorsiflexion 16  15 at evaluation     Transfers   Five time sit to stand comments  5xSTS: 12.40s  5xSTS: 21s       Ambulation/Gait   Ambulation/Gait Yes   Ambulation Distance (Feet) 220 Feet   Assistive device None   Gait Pattern Within Functional Limits   Ambulation Surface Outdoor;Grass   Gait velocity 1.20m/s  1.07m/s at evaluation.      High Level Balance   High Level Balance Comments SLS: R: 35s; L: 12s  SLS: R: 26.6s; L: 5.26s at evaluation                               PT Short Term Goals - 07/17/16 1505      PT SHORT TERM GOAL #1   Title After 2 weeks patient will demonstrate independence and compliance with HEP to improve joint range and strength in bilat knees.    Status Achieved     PT SHORT TERM GOAL #2   Title After 4 weeks patient will demonstrate improved functional mobility with 5xSTS<16s and Gait speed >1.12m/s to improve access to community.    Baseline Improved: at reassessment 5xSTS<13s, and Gait speed is >1.20m/s     Status Achieved     PT SHORT TERM GOAL #3   Title After 4 weeks pt will demonstrate knee circumference <35cm on R and Knee range of 8-110 bilat to improve performance of stairs.    Baseline Improvements have been good and are borderline to goals, but not quite to the values desired.    Status Partially Met           PT Long Term Goals - 07/17/16 1508      PT LONG TERM GOAL #1   Title After 7 weeks patient will demonstrate improved functional mobility with 5xSTS<13s and Gait speed >1.20 m/s to improve access to community.    Status Achieved     PT LONG TERM GOAL #2   Title After 7 weeks pt will demonstrate knee circumference <34cm on R and Knee range of 5-115 bilat to allow squatting to ground.    Status On-going     PT LONG TERM GOAL #3   Title After 7 weeks patient will demonstrate improved balance with SLS >30s bilat to decrease falls risk in home.    Baseline At reassessment this is met on right foot. Left foot SLS is less than 15s.    Status Partially Met               Plan - 07/17/16 1543    Clinical Impression Statement Reassessment performed today. The patient has acheived 3 of 4 short term goals, and has made progress toward the 4th short term goal. Assessment reveals significant improvement in kne ROM bilat, functional strength AEB 5xSTS time, functional mobility AEB maximal gait speed assessment, balance AEB SLS time, and joint edema AEB circumferential measurement of the knees bilat. Pt is enthusiastic about continuing to improve all of these areas.  ROM and joint effusion both remain the patients biggest areas of deficit with limitations to squating and rising from the floor. Patient also demonstrate limitatiosn in balance as well as cardiovascular activity tolerance to sustained AMB.    Rehab Potential Good   Clinical Impairments Affecting Rehab Potential chonicity of impairment   PT Frequency 2x / week   PT Duration 8 weeks   PT Treatment/Interventions  ADLs/Self Care Home Management;Electrical Stimulation;Moist Heat;Cryotherapy;Therapeutic exercise;Therapeutic activities;Functional mobility training;Balance training;Gait training;Stair training;Patient/family education   PT Next Visit Plan Stronger focus on manual therapy and stretching to bilat knees for extension/flexion ROM; isolated quads strengthening.   Begin to add in more balance training and SLS on foam surfaces.    PT Home Exercise Plan 06/20/16 (SLR, LAQ, Bridge, Heel Slides); 07/12/2016: controlled STS   Consulted and Agree with Plan of Care Patient      Patient will benefit from skilled therapeutic intervention in order to improve the following deficits and impairments:  Abnormal gait, Cardiopulmonary status limiting activity, Decreased activity tolerance, Decreased balance, Decreased endurance, Decreased range of motion, Decreased strength, Hypomobility, Increased edema, Impaired flexibility, Postural dysfunction, Pain  Visit Diagnosis: Right knee pain, unspecified chronicity  Left knee pain, unspecified chronicity  Muscle weakness (generalized)  Stiffness of right knee, not elsewhere classified       G-Codes - July 23, 2016 1540    Functional Assessment Tool Used Clinical Judgment    Functional Limitation Mobility: Walking and moving around   Mobility: Walking and Moving Around Current Status [phone]) At least 1 percent but less than 20 percent impaired, limited or restricted   Mobility: Walking and Moving Around Goal Status [phone]) At least 1 percent but less than 20 percent impaired, limited or restricted      Problem List Patient Active Problem List   Diagnosis Date Noted  . Pain in the chest   . Chest pain 09/03/2014  . HTN (hypertension) 09/03/2014  . Hyperlipidemia 09/03/2014  . Pain in joint, shoulder region 07/10/2012  . 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  . Osteoarthrosis, unspecified whether generalized or localized, lower leg 04/21/2008  . KNEE PAIN  04/21/2008  . HIGH BLOOD PRESSURE 04/20/2008    3:52 PM, 07/23/2016 Etta Grandchild, PT, DPT Physical Therapist at [place] [phone] (office)     [facility] [address] [place], [place], [address] Phone: [phone]   Fax:  [phone]  Name: Monique [facility] MRN: [id number] Date of Birth: Oct 13, 1940

## 2016-07-19 ENCOUNTER — Ambulatory Visit (HOSPITAL_COMMUNITY): Payer: Medicare Other

## 2016-07-20 ENCOUNTER — Ambulatory Visit (HOSPITAL_COMMUNITY): Payer: Medicare Other

## 2016-07-20 DIAGNOSIS — M25561 Pain in right knee: Secondary | ICD-10-CM | POA: Diagnosis not present

## 2016-07-20 DIAGNOSIS — M25662 Stiffness of left knee, not elsewhere classified: Secondary | ICD-10-CM

## 2016-07-20 DIAGNOSIS — M25562 Pain in left knee: Secondary | ICD-10-CM

## 2016-07-20 DIAGNOSIS — M6281 Muscle weakness (generalized): Secondary | ICD-10-CM

## 2016-07-20 DIAGNOSIS — M25661 Stiffness of right knee, not elsewhere classified: Secondary | ICD-10-CM

## 2016-07-20 NOTE — Therapy (Signed)
[place], [place], [address] Phone: [phone]   Fax:  [phone]  Physical Therapy Treatment  Patient Details  Name: Monique [facility] MRN: [id number] Date of Birth: 07-Mar-1941 Referring Provider: Hurley Cisco   Encounter Date: 07/20/2016      PT End of Session - 07/20/16 1039    Visit Number 8   Number of Visits 16   Date for PT Re-Evaluation 2016-08-28   Authorization Type BCBS Medicare   Authorization Time Period 06-28-16-Aug 28, 2016 (G-codes done on 26-Jul-2023)   Authorization - Visit Number 8   Authorization - Number of Visits 17   PT Start Time 4037   PT Stop Time 1118   PT Time Calculation (min) 43 min   Activity Tolerance Patient tolerated treatment well;No increased pain   Behavior During Therapy WFL for tasks assessed/performed      Past Medical History:  Diagnosis Date  . Arthritis   . HTN (hypertension)   . Hyperlipidemia     Past Surgical History:  Procedure Laterality Date  . ABDOMINAL HYSTERECTOMY      There were no vitals filed for this visit.      Subjective Assessment - 07/20/16 1038    Subjective Pt stated she is doing well today, both knees are stiff no reports of pain.  Reports compliance iwth HEP daily.   Patient Stated Goals Reduce pain and stiffness so she can jump rope or line dance.    Currently in Pain? No/denies             [facility] Adult PT Treatment/Exercise - 07/20/16 0001      Knee/Hip Exercises: Stretches   Active Hamstring Stretch Both;30 seconds;3 reps   Active Hamstring Stretch Limitations standing, 12 inch box    Knee: Self-Stretch to increase Flexion Both   Knee: Self-Stretch Limitations 10 reps, 10 second holds    Press photographer Both;3 reps;30 seconds   Gastroc Stretch Limitations slantboard    Other Knee/Hip Stretches supine hip flexed 90 degrees with hand behind thigh flex and extend Bil knees 10x10"     Knee/Hip Exercises: Supine   Quad Sets Both;1  set;15 reps   Short Arc Quad Sets 15 reps   Heel Slides Both;10 reps     Manual Therapy   Manual Therapy Edema management;Joint mobilization   Manual therapy comments Manual complete separate rest of tx   Edema Management Retro massage with LE elevated   Joint Mobilization patella mobs all directions' tibia on femur             PT Short Term Goals - 2016-07-25 1505      PT SHORT TERM GOAL #1   Title After 2 weeks patient will demonstrate independence and compliance with HEP to improve joint range and strength in bilat knees.    Status Achieved     PT SHORT TERM GOAL #2   Title After 4 weeks patient will demonstrate improved functional mobility with 5xSTS<16s and Gait speed >1.65ms to improve access to community.    Baseline Improved: at reassessment 5xSTS<13s, and Gait speed is >1.241m    Status Achieved     PT SHORT TERM GOAL #3   Title After 4 weeks pt will demonstrate knee circumference <35cm on R and Knee range of 8-110 bilat to improve performance of stairs.    Baseline Improvements have been good and are borderline to goals, but not quite to the values desired.    Status Partially Met  PT Long Term Goals - 07/17/16 1508      PT LONG TERM GOAL #1   Title After 7 weeks patient will demonstrate improved functional mobility with 5xSTS<13s and Gait speed >1.20 m/s to improve access to community.    Status Achieved     PT LONG TERM GOAL #2   Title After 7 weeks pt will demonstrate knee circumference <34cm on R and Knee range of 5-115 bilat to allow squatting to ground.    Status On-going     PT LONG TERM GOAL #3   Title After 7 weeks patient will demonstrate improved balance with SLS >30s bilat to decrease falls risk in home.    Baseline At reassessment this is met on right foot. Left foot SLS is less than 15s.    Status Partially Met               Plan - 07/20/16 1107    Clinical Impression Statement Continued session focus on improving knee  mobility with functional stretches, isolated quad strengthening and manual techniques.  Added manual retro massage for edema control and joint mobs (patella and tib/fib) to improve knee mobility and edema control.  Improved AROM at EOS Lt 6-105 degrees and Rt 6-93 degrees.  No reports of pain through session.     Clinical Impairments Affecting Rehab Potential chonicity of impairment   PT Frequency 2x / week   PT Duration 8 weeks   PT Treatment/Interventions ADLs/Self Care Home Management;Electrical Stimulation;Moist Heat;Cryotherapy;Therapeutic exercise;Therapeutic activities;Functional mobility training;Balance training;Gait training;Stair training;Patient/family education   PT Next Visit Plan Stronger focus on manual therapy and stretching to bilat knees for extension/flexion ROM; isolated quads strengthening. Begin to add in more balance training and SLS on foam surfaces.    PT Home Exercise Plan 06/20/16 (SLR, LAQ, Bridge, Heel Slides); 07/12/2016: controlled STS      Patient will benefit from skilled therapeutic intervention in order to improve the following deficits and impairments:  Abnormal gait, Cardiopulmonary status limiting activity, Decreased activity tolerance, Decreased balance, Decreased endurance, Decreased range of motion, Decreased strength, Hypomobility, Increased edema, Impaired flexibility, Postural dysfunction, Pain  Visit Diagnosis: Right knee pain, unspecified chronicity  Left knee pain, unspecified chronicity  Muscle weakness (generalized)  Stiffness of right knee, not elsewhere classified  Stiffness of left knee, not elsewhere classified     Problem List Patient Active Problem List   Diagnosis Date Noted  . Pain in the chest   . Chest pain 09/03/2014  . HTN (hypertension) 09/03/2014  . Hyperlipidemia 09/03/2014  . Pain in joint, shoulder region 07/10/2012  . 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  . Osteoarthrosis, unspecified whether generalized or  localized, lower leg 04/21/2008  . KNEE PAIN 04/21/2008  . HIGH BLOOD PRESSURE 04/20/2008   Ihor Austin, LPTA; CBIS [phone]  Aldona Lento 07/20/2016, 11:40 AM  [place], [place], [address] Phone: [phone]   Fax:  [phone]  Name: Monique [facility] MRN: [id number] Date of Birth: 1941/05/20

## 2016-07-24 ENCOUNTER — Ambulatory Visit (HOSPITAL_COMMUNITY): Payer: Medicare Other

## 2016-07-24 DIAGNOSIS — M25661 Stiffness of right knee, not elsewhere classified: Secondary | ICD-10-CM

## 2016-07-24 DIAGNOSIS — M6281 Muscle weakness (generalized): Secondary | ICD-10-CM

## 2016-07-24 DIAGNOSIS — M25561 Pain in right knee: Secondary | ICD-10-CM | POA: Diagnosis not present

## 2016-07-24 DIAGNOSIS — M25662 Stiffness of left knee, not elsewhere classified: Secondary | ICD-10-CM

## 2016-07-24 DIAGNOSIS — M25562 Pain in left knee: Secondary | ICD-10-CM

## 2016-07-24 NOTE — Therapy (Signed)
[place], [place], [address] Phone: [phone]   Fax:  [phone]  Physical Therapy Treatment  Patient Details  Name: Monique Wagner MRN: [id number] Date of Birth: May 30, 1941 Referring Provider: Hurley Cisco   Encounter Date: 07/24/2016      PT End of Session - 07/24/16 1122    Visit Number 9   Number of Visits 16   Date for PT Re-Evaluation 2016-09-05   Authorization Type BCBS Medicare   Authorization Time Period Jul 06, 2016-September 05, 2016 (G-codes done on August 03, 2023)   Authorization - Visit Number 9   Authorization - Number of Visits 17   PT Start Time 1120   PT Stop Time 1201   PT Time Calculation (min) 41 min   Activity Tolerance Patient tolerated treatment well;No increased pain   Behavior During Therapy WFL for tasks assessed/performed      Past Medical History:  Diagnosis Date  . Arthritis   . HTN (hypertension)   . Hyperlipidemia     Past Surgical History:  Procedure Laterality Date  . ABDOMINAL HYSTERECTOMY      There were no vitals filed for this visit.      Subjective Assessment - 07/24/16 1118    Subjective No reports of pain today, continues to be stiff.   Patient Stated Goals Reduce pain and stiffness so she can jump rope or line dance.    Currently in Pain? No/denies             [facility] Adult PT Treatment/Exercise - 07/24/16 0001      Knee/Hip Exercises: Stretches   Active Hamstring Stretch Both;30 seconds;3 reps   Active Hamstring Stretch Limitations standing, 12 inch box    Quad Stretch 3 reps;30 seconds   Quad Stretch Limitations prone with rope   Knee: Self-Stretch to increase Flexion Both   Knee: Self-Stretch Limitations 10 reps, 10 second holds 12in step   Press photographer Both;3 reps;30 seconds   Gastroc Stretch Limitations slantboard      Knee/Hip Exercises: Supine   Short Arc Quad Sets 15 reps     Knee/Hip Exercises: Prone   Prone Knee Hang 3 minutes   Prone Knee  Hang Limitations manual to Bil hamstrings     Manual Therapy   Manual Therapy Edema management;Joint mobilization   Manual therapy comments Manual complete separate rest of tx   Edema Management Retro massage with LE elevated   Joint Mobilization patella mobs all directions' tibia on femur       Supine:  SAQ 15x            PT Short Term Goals - Aug 02, 2016 1505      PT SHORT TERM GOAL #1   Title After 2 weeks patient will demonstrate independence and compliance with HEP to improve joint range and strength in bilat knees.    Status Achieved     PT SHORT TERM GOAL #2   Title After 4 weeks patient will demonstrate improved functional mobility with 5xSTS<16s and Gait speed >1.86ms to improve access to community.    Baseline Improved: at reassessment 5xSTS<13s, and Gait speed is >1.243m    Status Achieved     PT SHORT TERM GOAL #3   Title After 4 weeks pt will demonstrate knee circumference <35cm on R and Knee range of 8-110 bilat to improve performance of stairs.    Baseline Improvements have been good and are borderline to goals, but not quite to the values desired.    Status  Partially Met           PT Long Term Goals - 07/17/16 1508      PT LONG TERM GOAL #1   Title After 7 weeks patient will demonstrate improved functional mobility with 5xSTS<13s and Gait speed >1.20 m/s to improve access to community.    Status Achieved     PT LONG TERM GOAL #2   Title After 7 weeks pt will demonstrate knee circumference <34cm on R and Knee range of 5-115 bilat to allow squatting to ground.    Status On-going     PT LONG TERM GOAL #3   Title After 7 weeks patient will demonstrate improved balance with SLS >30s bilat to decrease falls risk in home.    Baseline At reassessment this is met on right foot. Left foot SLS is less than 15s.    Status Partially Met               Plan - 07/24/16 1148    Clinical Impression Statement Added prone knee hang with manual to  hamstrings to improve knee extension.  Continued session focus on improving knee mobilty with functional stretches and quad strengthening.  Continued with manual retro massage and joint mobs to improve edema control and joint mobiltiy.  No reports of pain through session.     Rehab Potential Good   Clinical Impairments Affecting Rehab Potential chonicity of impairment   PT Frequency 2x / week   PT Duration 8 weeks   PT Treatment/Interventions ADLs/Self Care Home Management;Electrical Stimulation;Moist Heat;Cryotherapy;Therapeutic exercise;Therapeutic activities;Functional mobility training;Balance training;Gait training;Stair training;Patient/family education   PT Next Visit Plan Stronger focus on manual therapy and stretching to bilat knees for extension/flexion ROM; isolated quads strengthening. Begin to add in more balance training and SLS on foam surfaces.    PT Home Exercise Plan 06/20/16 (SLR, LAQ, Bridge, Heel Slides); 07/12/2016: controlled STS; 07/24/2016: prone knee hang      Patient will benefit from skilled therapeutic intervention in order to improve the following deficits and impairments:  Abnormal gait, Cardiopulmonary status limiting activity, Decreased activity tolerance, Decreased balance, Decreased endurance, Decreased range of motion, Decreased strength, Hypomobility, Increased edema, Impaired flexibility, Postural dysfunction, Pain  Visit Diagnosis: Right knee pain, unspecified chronicity  Left knee pain, unspecified chronicity  Muscle weakness (generalized)  Stiffness of right knee, not elsewhere classified  Stiffness of left knee, not elsewhere classified     Problem List Patient Active Problem List   Diagnosis Date Noted  . Pain in the chest   . Chest pain 09/03/2014  . HTN (hypertension) 09/03/2014  . Hyperlipidemia 09/03/2014  . Pain in joint, shoulder region 07/10/2012  . 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  . Osteoarthrosis, unspecified whether  generalized or localized, lower leg 04/21/2008  . KNEE PAIN 04/21/2008  . HIGH BLOOD PRESSURE 04/20/2008   Ihor Austin, LPTA; [place]   Aldona Lento 07/24/2016, 12:11 PM  [place], [place], [address] Phone: [phone]   Fax:  [phone]  Name: Monique Wagner MRN: [id number] Date of Birth: 1941/08/11

## 2016-07-24 NOTE — Patient Instructions (Signed)
Knee Extension Mobilization: Hang (Prone)    With table supporting thighs. Hold 5 minutes and increase time as tolerated. Repeat 1 times per set. Do1 sets per session. Do 2 sessions per day.  http://orth.exer.us/723   Copyright  VHI. All rights reserved.

## 2016-07-26 ENCOUNTER — Ambulatory Visit (HOSPITAL_COMMUNITY): Payer: Medicare Other

## 2016-07-26 DIAGNOSIS — M6281 Muscle weakness (generalized): Secondary | ICD-10-CM

## 2016-07-26 DIAGNOSIS — M25661 Stiffness of right knee, not elsewhere classified: Secondary | ICD-10-CM

## 2016-07-26 DIAGNOSIS — M25561 Pain in right knee: Secondary | ICD-10-CM | POA: Diagnosis not present

## 2016-07-26 DIAGNOSIS — M25662 Stiffness of left knee, not elsewhere classified: Secondary | ICD-10-CM

## 2016-07-26 DIAGNOSIS — M25562 Pain in left knee: Secondary | ICD-10-CM

## 2016-07-26 NOTE — Therapy (Signed)
[place], [place], [address] Phone: [phone]   Fax:  [phone]  Physical Therapy Treatment  Patient Details  Name: Monique Wagner MRN: [id number] Date of Birth: 1941-08-25 Referring Provider: Hurley Cisco   Encounter Date: 07/26/2016      PT End of Session - 07/26/16 0950    Visit Number 10   Number of Visits 16   Date for PT Re-Evaluation 09-05-16   Authorization Type BCBS Medicare   Authorization Time Period 2016/07/06-12-06-17 (G-codes done on Aug 03, 2023)   Authorization - Visit Number 10   Authorization - Number of Visits 17   PT Start Time 5456   PT Stop Time 1030   PT Time Calculation (min) 46 min   Activity Tolerance Patient tolerated treatment well;No increased pain   Behavior During Therapy WFL for tasks assessed/performed      Past Medical History:  Diagnosis Date  . Arthritis   . HTN (hypertension)   . Hyperlipidemia     Past Surgical History:  Procedure Laterality Date  . ABDOMINAL HYSTERECTOMY      There were no vitals filed for this visit.      Subjective Assessment - 07/26/16 0946    Subjective Pt stated she felt good rest of day following last session, currently Bil knees are stiff today R> Lt.  Reports compliance with additional HEP given last session.     Patient Stated Goals Reduce pain and stiffness so she can jump rope or line dance.    Currently in Pain? Yes   Pain Score 6    Pain Location Knee   Pain Orientation Right;Left   Pain Descriptors / Indicators --  Stiff                         [facility] Adult PT Treatment/Exercise - 07/26/16 0001      Knee/Hip Exercises: Stretches   Active Hamstring Stretch Both;30 seconds;3 reps   Active Hamstring Stretch Limitations supine with rope   Quad Stretch 3 reps;30 seconds   Quad Stretch Limitations prone with rope   Knee: Self-Stretch to increase Flexion Both   Knee: Self-Stretch Limitations 10 reps, 10  second holds 12in step   Gastroc Stretch Both;3 reps;30 seconds   Gastroc Stretch Limitations slantboard      Knee/Hip Exercises: Seated   Sit to [employer] 10 reps;without UE support  cueing for eccentric control     Knee/Hip Exercises: Supine   Quad Sets Both;1 set;15 reps   Short Arc Quad Sets 15 reps   Heel Slides Both;10 reps     Manual Therapy   Manual Therapy Edema management;Joint mobilization   Manual therapy comments Manual complete separate rest of tx   Edema Management Retro massage with LE elevated   Joint Mobilization patella mobs all directions' tibia on femur                  PT Short Term Goals - 08-02-16 1505      PT SHORT TERM GOAL #1   Title After 2 weeks patient will demonstrate independence and compliance with HEP to improve joint range and strength in bilat knees.    Status Achieved     PT SHORT TERM GOAL #2   Title After 4 weeks patient will demonstrate improved functional mobility with 5xSTS<16s and Gait speed >1.50ms to improve access to community.    Baseline Improved: at reassessment 5xSTS<13s, and Gait speed is >1.216m  Status Achieved     PT SHORT TERM GOAL #3   Title After 4 weeks pt will demonstrate knee circumference <35cm on R and Knee range of 8-110 bilat to improve performance of stairs.    Baseline Improvements have been good and are borderline to goals, but not quite to the values desired.    Status Partially Met           PT Long Term Goals - 07/17/16 1508      PT LONG TERM GOAL #1   Title After 7 weeks patient will demonstrate improved functional mobility with 5xSTS<13s and Gait speed >1.20 m/s to improve access to community.    Status Achieved     PT LONG TERM GOAL #2   Title After 7 weeks pt will demonstrate knee circumference <34cm on R and Knee range of 5-115 bilat to allow squatting to ground.    Status On-going     PT LONG TERM GOAL #3   Title After 7 weeks patient will demonstrate improved balance with SLS  >30s bilat to decrease falls risk in home.    Baseline At reassessment this is met on right foot. Left foot SLS is less than 15s.    Status Partially Met               Plan - 07/26/16 1002    Clinical Impression Statement Continued session focus on improving Bil knee mobility with functional stretches, quad strengthening and manual techniques.  Noted increased mobility following manual retro massage for edema control.  No reports of pain at EOS.  Added sit to stand for eccentric control strengthening.     Rehab Potential Good   Clinical Impairments Affecting Rehab Potential chonicity of impairment   PT Frequency 2x / week   PT Duration 8 weeks   PT Treatment/Interventions ADLs/Self Care Home Management;Electrical Stimulation;Moist Heat;Cryotherapy;Therapeutic exercise;Therapeutic activities;Functional mobility training;Balance training;Gait training;Stair training;Patient/family education   PT Next Visit Plan Stronger focus on manual therapy and stretching to bilat knees for extension/flexion ROM; isolated quads strengthening. Begin to add in more balance training and SLS on foam surfaces.       Patient will benefit from skilled therapeutic intervention in order to improve the following deficits and impairments:  Abnormal gait, Cardiopulmonary status limiting activity, Decreased activity tolerance, Decreased balance, Decreased endurance, Decreased range of motion, Decreased strength, Hypomobility, Increased edema, Impaired flexibility, Postural dysfunction, Pain  Visit Diagnosis: Right knee pain, unspecified chronicity  Left knee pain, unspecified chronicity  Muscle weakness (generalized)  Stiffness of right knee, not elsewhere classified  Stiffness of left knee, not elsewhere classified     Problem List Patient Active Problem List   Diagnosis Date Noted  . Pain in the chest   . Chest pain 09/03/2014  . HTN (hypertension) 09/03/2014  . Hyperlipidemia 09/03/2014  . Pain  in joint, shoulder region 07/10/2012  . 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  . Osteoarthrosis, unspecified whether generalized or localized, lower leg 04/21/2008  . KNEE PAIN 04/21/2008  . HIGH BLOOD PRESSURE 04/20/2008   Ihor Austin, LPTA; [facility] [phone]  Aldona Lento 07/26/2016, 10:41 AM  [place], [place], [address] Phone: [phone]   Fax:  [phone]  Name: Monique Wagner MRN: [id number] Date of Birth: 1940/10/12

## 2016-07-31 ENCOUNTER — Ambulatory Visit (HOSPITAL_COMMUNITY): Payer: Medicare Other

## 2016-07-31 DIAGNOSIS — M25561 Pain in right knee: Secondary | ICD-10-CM | POA: Diagnosis not present

## 2016-07-31 DIAGNOSIS — M25661 Stiffness of right knee, not elsewhere classified: Secondary | ICD-10-CM

## 2016-07-31 DIAGNOSIS — M25562 Pain in left knee: Secondary | ICD-10-CM

## 2016-07-31 DIAGNOSIS — M6281 Muscle weakness (generalized): Secondary | ICD-10-CM

## 2016-07-31 DIAGNOSIS — M25662 Stiffness of left knee, not elsewhere classified: Secondary | ICD-10-CM

## 2016-07-31 NOTE — Therapy (Signed)
[place], [place], [address] Phone: [phone]   Fax:  [phone]  Physical Therapy Treatment  Patient Details  Name: Monique Wagner MRN: [id number] Date of Birth: [birth year] Referring Provider: Hurley Cisco   Encounter Date: 07/31/2016      PT End of Session - 07/31/16 0952    Visit Number 11   Number of Visits 16   Date for PT Re-Evaluation 08/22/2016   Authorization Type BCBS Medicare   Authorization Time Period 2016-06-22-22-Aug-2016 (G-codes done on Jul 20, 2023)   Authorization - Visit Number 11   Authorization - Number of Visits 17   PT Start Time 9381   PT Stop Time 8299   PT Time Calculation (min) 40 min   Activity Tolerance Patient tolerated treatment well;No increased pain   Behavior During Therapy WFL for tasks assessed/performed      Past Medical History:  Diagnosis Date  . Arthritis   . HTN (hypertension)   . Hyperlipidemia     Past Surgical History:  Procedure Laterality Date  . ABDOMINAL HYSTERECTOMY      There were no vitals filed for this visit.      Subjective Assessment - 07/31/16 0948    Subjective Pt stated her knees are stiff today Rt>Lt 5/10.     Patient Stated Goals Reduce pain and stiffness so she can jump rope or line dance.    Currently in Pain? Yes   Pain Score 5    Pain Location Knee   Pain Orientation Right;Left   Pain Descriptors / Indicators Tightness  Stiffness            [facility] Adult PT Treatment/Exercise - 07/31/16 0001      Knee/Hip Exercises: Stretches   Active Hamstring Stretch Both;30 seconds;3 reps   Active Hamstring Stretch Limitations 12in step   Quad Stretch 3 reps;30 seconds   Quad Stretch Limitations prone with rope   Knee: Self-Stretch to increase Flexion Both   Knee: Self-Stretch Limitations 10 reps, 10 second holds 12in step   Gastroc Stretch Both;3 reps;30 seconds   Gastroc Stretch Limitations slantboard      Knee/Hip Exercises: Standing    SLS Lt 31", Rt 38' max of 3   Other Standing Knee Exercises tandem stance on airex 3x 30" no HHA     Knee/Hip Exercises: Supine   Quad Sets Both;1 set;15 reps   Short Arc Quad Sets 15 reps   Heel Slides Both;10 reps   Other Supine Knee/Hip Exercises AROM Rt 4-98   Other Supine Knee/Hip Exercises AROM 6-102     Manual Therapy   Manual Therapy Edema management;Joint mobilization   Manual therapy comments Manual complete separate rest of tx   Edema Management Retro massage with LE elevated                  PT Short Term Goals - 2016/07/19 1505      PT SHORT TERM GOAL #1   Title After 2 weeks patient will demonstrate independence and compliance with HEP to improve joint range and strength in bilat knees.    Status Achieved     PT SHORT TERM GOAL #2   Title After 4 weeks patient will demonstrate improved functional mobility with 5xSTS<16s and Gait speed >1.2ms to improve access to community.    Baseline Improved: at reassessment 5xSTS<13s, and Gait speed is >1.262m    Status Achieved     PT SHORT TERM GOAL #3   Title After  4 weeks pt will demonstrate knee circumference <35cm on R and Knee range of 8-110 bilat to improve performance of stairs.    Baseline Improvements have been good and are borderline to goals, but not quite to the values desired.    Status Partially Met           PT Long Term Goals - 07/17/16 1508      PT LONG TERM GOAL #1   Title After 7 weeks patient will demonstrate improved functional mobility with 5xSTS<13s and Gait speed >1.20 m/s to improve access to community.    Status Achieved     PT LONG TERM GOAL #2   Title After 7 weeks pt will demonstrate knee circumference <34cm on R and Knee range of 5-115 bilat to allow squatting to ground.    Status On-going     PT LONG TERM GOAL #3   Title After 7 weeks patient will demonstrate improved balance with SLS >30s bilat to decrease falls risk in home.    Baseline At reassessment this is met on  right foot. Left foot SLS is less than 15s.    Status Partially Met               Plan - 07/31/16 1047    Clinical Impression Statement Continued session focus on improving Bil knee mobility wtih functional stretches, functional quad strengthening and manual technqiues.  Added balance training with min A and cueing to improve spatial awareness with less A required.  Pt is improving eccentric control noted today with increased ease with STS.  EOS pt reports decreased stiffness with improved gait mechanics.     Rehab Potential Good   Clinical Impairments Affecting Rehab Potential chonicity of impairment   PT Frequency 2x / week   PT Duration 8 weeks   PT Treatment/Interventions ADLs/Self Care Home Management;Electrical Stimulation;Moist Heat;Cryotherapy;Therapeutic exercise;Therapeutic activities;Functional mobility training;Balance training;Gait training;Stair training;Patient/family education   PT Next Visit Plan Stronger focus on manual therapy and stretching to bilat knees for extension/flexion ROM; isolated quads strengthening. Begin to add in more balance training and SLS on foam surfaces.       Patient will benefit from skilled therapeutic intervention in order to improve the following deficits and impairments:  Abnormal gait, Cardiopulmonary status limiting activity, Decreased activity tolerance, Decreased balance, Decreased endurance, Decreased range of motion, Decreased strength, Hypomobility, Increased edema, Impaired flexibility, Postural dysfunction, Pain  Visit Diagnosis: Right knee pain, unspecified chronicity  Left knee pain, unspecified chronicity  Muscle weakness (generalized)  Stiffness of right knee, not elsewhere classified  Stiffness of left knee, not elsewhere classified     Problem List Patient Active Problem List   Diagnosis Date Noted  . Pain in the chest   . Chest pain 09/03/2014  . HTN (hypertension) 09/03/2014  . Hyperlipidemia 09/03/2014  .  Pain in joint, shoulder region 07/10/2012  . 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  . Osteoarthrosis, unspecified whether generalized or localized, lower leg 04/21/2008  . KNEE PAIN 04/21/2008  . HIGH BLOOD PRESSURE 04/20/2008   Monique Wagner, LPTA; [place]  Monique Wagner 07/31/2016, 12:14 PM  [place], [place], [address] Phone: [phone]   Fax:  [phone]  Name: Monique Wagner MRN: [id number] Date of Birth: 28-Jul-1941

## 2016-08-02 ENCOUNTER — Ambulatory Visit (HOSPITAL_COMMUNITY): Payer: Medicare Other | Attending: Rheumatology

## 2016-08-02 DIAGNOSIS — M6281 Muscle weakness (generalized): Secondary | ICD-10-CM

## 2016-08-02 DIAGNOSIS — M25562 Pain in left knee: Secondary | ICD-10-CM | POA: Diagnosis present

## 2016-08-02 DIAGNOSIS — M25661 Stiffness of right knee, not elsewhere classified: Secondary | ICD-10-CM | POA: Diagnosis present

## 2016-08-02 DIAGNOSIS — M25561 Pain in right knee: Secondary | ICD-10-CM | POA: Diagnosis present

## 2016-08-02 NOTE — Therapy (Signed)
[place], [place], [address] Phone: [phone]   Fax:  [phone]  Physical Therapy Treatment  Patient Details  Name: Monique Wagner MRN: [id number] Date of Birth: [birth year] Referring Provider: Hurley Cisco   Encounter Date: 08/02/2016      PT End of Session - 08/02/16 1006    Visit Number 12   Number of Visits 16   Date for PT Re-Evaluation 09-05-2016   Authorization Type BCBS Medicare   Authorization Time Period 2016/07/06-12-06-17 (G-codes done on 2023/08/03)   Authorization - Visit Number 12   Authorization - Number of Visits 17   PT Start Time 0948   PT Stop Time 1026   PT Time Calculation (min) 38 min   Activity Tolerance Patient tolerated treatment well;No increased pain   Behavior During Therapy WFL for tasks assessed/performed      Past Medical History:  Diagnosis Date  . Arthritis   . HTN (hypertension)   . Hyperlipidemia     Past Surgical History:  Procedure Laterality Date  . ABDOMINAL HYSTERECTOMY      There were no vitals filed for this visit.      Subjective Assessment - 08/02/16 0953    Subjective Pt reports her knees are stiff but not painful today. She thinks that her Rt knee is less swollen upon waking this morning than what is typical for her.    Currently in Pain? No/denies                         [facility] Adult PT Treatment/Exercise - 08/02/16 0001      Knee/Hip Exercises: Stretches   Other Knee/Hip Stretches Quadruped knee flexion stretch  10x5sec     Knee/Hip Exercises: Standing   SLS 5x20sec, alternating bilat    Other Standing Knee Exercises tandem stance on airex 3x 30" no HHA     Knee/Hip Exercises: Seated   Long Arc Quad 3 sets;10 reps;Weights   Long Arc Quad Weight 5 lbs.  Try 7.5lb weights next session!    Heel Slides 20 reps;AROM;Both   Heel Slides Limitations towel under foot, 5sH Quad set, 5sH HS set   Sit to [employer] 10 reps;2 sets      Knee/Hip Exercises: Supine   Quad Sets Limitations *moved to sitting   Short Arc Quad Sets Limitations *moved to sitting   Heel Slides Limitations *moved to sitting     Knee/Hip Exercises: Prone   Prone Knee Hang 5 minutes;Weights   Prone Knee Hang Weights (lbs) 5   Prone Knee Hang Limitations --  intermittent between HS curls in prone                  PT Short Term Goals - 2016-08-02 1505      PT SHORT TERM GOAL #1   Title After 2 weeks patient will demonstrate independence and compliance with HEP to improve joint range and strength in bilat knees.    Status Achieved     PT SHORT TERM GOAL #2   Title After 4 weeks patient will demonstrate improved functional mobility with 5xSTS<16s and Gait speed >1.11ms to improve access to community.    Baseline Improved: at reassessment 5xSTS<13s, and Gait speed is >1.257m    Status Achieved     PT SHORT TERM GOAL #3   Title After 4 weeks pt will demonstrate knee circumference <35cm on R and Knee range of 8-110 bilat to improve performance  of stairs.    Baseline Improvements have been good and are borderline to goals, but not quite to the values desired.    Status Partially Met           PT Long Term Goals - 07/17/16 1508      PT LONG TERM GOAL #1   Title After 7 weeks patient will demonstrate improved functional mobility with 5xSTS<13s and Gait speed >1.20 m/s to improve access to community.    Status Achieved     PT LONG TERM GOAL #2   Title After 7 weeks pt will demonstrate knee circumference <34cm on R and Knee range of 5-115 bilat to allow squatting to ground.    Status On-going     PT LONG TERM GOAL #3   Title After 7 weeks patient will demonstrate improved balance with SLS >30s bilat to decrease falls risk in home.    Baseline At reassessment this is met on right foot. Left foot SLS is less than 15s.    Status Partially Met               Plan - 08/02/16 1007    Clinical Impression Statement Progressing  therex and stretches today with great patient response, improved resolution of stiffness, imrpoved ease of PROM stretches, and ability to load quads at a higher level than previous. Pt making exellent progress toward goals.    Rehab Potential Good   Clinical Impairments Affecting Rehab Potential chonicity of impairment   PT Frequency 2x / week   PT Duration 8 weeks   PT Treatment/Interventions ADLs/Self Care Home Management;Electrical Stimulation;Moist Heat;Cryotherapy;Therapeutic exercise;Therapeutic activities;Functional mobility training;Balance training;Gait training;Stair training;Patient/family education   PT Next Visit Plan  knee joint mobilization/manual therapy PRN; repeat quadruped stretching to bilat knees for flexion ROM; isolated quads strengthening. Continue with balance training and SLS on foam surfaces.    PT Home Exercise Plan 06/20/16 (SLR, LAQ, Bridge, Heel Slides); 07/12/2016: controlled STS; 07/24/2016: prone knee hang   Consulted and Agree with Plan of Care Patient      Patient will benefit from skilled therapeutic intervention in order to improve the following deficits and impairments:  Abnormal gait, Cardiopulmonary status limiting activity, Decreased activity tolerance, Decreased balance, Decreased endurance, Decreased range of motion, Decreased strength, Hypomobility, Increased edema, Impaired flexibility, Postural dysfunction, Pain  Visit Diagnosis: Right knee pain, unspecified chronicity  Left knee pain, unspecified chronicity  Muscle weakness (generalized)  Stiffness of right knee, not elsewhere classified     Problem List Patient Active Problem List   Diagnosis Date Noted  . Pain in the chest   . Chest pain 09/03/2014  . HTN (hypertension) 09/03/2014  . Hyperlipidemia 09/03/2014  . Pain in joint, shoulder region 07/10/2012  . 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  . Osteoarthrosis, unspecified whether generalized or localized, lower leg 04/21/2008   . KNEE PAIN 04/21/2008  . HIGH BLOOD PRESSURE 04/20/2008    10:27 AM, 08/02/16 Etta Grandchild, PT, DPT Physical Therapist at [facility] [phone] (office)     [place] [address] [place], [place], [address] Phone: [phone]   Fax:  [phone]  Name: Monique Wagner MRN: [id number] Date of Birth: Feb 22, 1941

## 2016-08-07 ENCOUNTER — Ambulatory Visit (HOSPITAL_COMMUNITY): Payer: Medicare Other

## 2016-08-07 DIAGNOSIS — M25561 Pain in right knee: Secondary | ICD-10-CM | POA: Diagnosis not present

## 2016-08-07 DIAGNOSIS — M25661 Stiffness of right knee, not elsewhere classified: Secondary | ICD-10-CM

## 2016-08-07 DIAGNOSIS — M25562 Pain in left knee: Secondary | ICD-10-CM

## 2016-08-07 DIAGNOSIS — M6281 Muscle weakness (generalized): Secondary | ICD-10-CM

## 2016-08-07 NOTE — Therapy (Signed)
[place], [place], [address] Phone: [phone]   Fax:  [phone]  Physical Therapy Treatment  Patient Details  Name: Monique Wagner MRN: [id number] Date of Birth: 13-Jul-1941 Referring Provider: Hurley Cisco   Encounter Date: 08/07/2016      PT End of Session - 08/07/16 1219    Visit Number 13   Number of Visits 16   Date for PT Re-Evaluation Sep 16, 2016   Authorization Type BCBS Medicare   Authorization Time Period 2016-07-17-16-Sep-2016 (G-codes done on 08/14/23)   Authorization - Visit Number 13   Authorization - Number of Visits 17   PT Start Time 1207   PT Stop Time 0932   PT Time Calculation (min) 38 min   Activity Tolerance Patient tolerated treatment well;No increased pain   Behavior During Therapy WFL for tasks assessed/performed      Past Medical History:  Diagnosis Date  . Arthritis   . HTN (hypertension)   . Hyperlipidemia     Past Surgical History:  Procedure Laterality Date  . ABDOMINAL HYSTERECTOMY      There were no vitals filed for this visit.      Subjective Assessment - 08/07/16 1212    Subjective Pt is doign well. She was able to attempt the new quadruped knee flexion stretch at home and said it was a success. Rainy weather is causing some additional pain today.    Currently in Pain? Yes   Pain Score 5    Pain Location --  both knees    Pain Orientation Right;Left                         [facility] Adult PT Treatment/Exercise - 08/07/16 0001      Knee/Hip Exercises: Stretches   Active Hamstring Stretch Both;30 seconds;2 reps   Active Hamstring Stretch Limitations 12in step     Knee/Hip Exercises: Standing   SLS 5x20sec, alternating bilat    Other Standing Knee Exercises semi- tandem stance on airex 3x 30" no HHA     Knee/Hip Exercises: Seated   Long Arc Quad 3 sets;10 reps;Weights   Long Arc Quad Weight 8 lbs.   Heel Slides --  ?x3sH for 2 minutes each  side   Heel Slides Limitations seated, towel under foot   Sit to Sand 10 reps;2 sets     Knee/Hip Exercises: Prone   Hamstring Curl 2 sets;10 reps  5# weights   Prone Knee Hang 5 minutes;Weights   Prone Knee Hang Weights (lbs) 5   Straight Leg Raises Limitations Quadruped knee flexion stretch 3x15sec                  PT Short Term Goals - 08/13/16 1505      PT SHORT TERM GOAL #1   Title After 2 weeks patient will demonstrate independence and compliance with HEP to improve joint range and strength in bilat knees.    Status Achieved     PT SHORT TERM GOAL #2   Title After 4 weeks patient will demonstrate improved functional mobility with 5xSTS<16s and Gait speed >1.43ms to improve access to community.    Baseline Improved: at reassessment 5xSTS<13s, and Gait speed is >1.263m    Status Achieved     PT SHORT TERM GOAL #3   Title After 4 weeks pt will demonstrate knee circumference <35cm on R and Knee range of 8-110 bilat to improve performance of stairs.  Baseline Improvements have been good and are borderline to goals, but not quite to the values desired.    Status Partially Met           PT Long Term Goals - 07/17/16 1508      PT LONG TERM GOAL #1   Title After 7 weeks patient will demonstrate improved functional mobility with 5xSTS<13s and Gait speed >1.20 m/s to improve access to community.    Status Achieved     PT LONG TERM GOAL #2   Title After 7 weeks pt will demonstrate knee circumference <34cm on R and Knee range of 5-115 bilat to allow squatting to ground.    Status On-going     PT LONG TERM GOAL #3   Title After 7 weeks patient will demonstrate improved balance with SLS >30s bilat to decrease falls risk in home.    Baseline At reassessment this is met on right foot. Left foot SLS is less than 15s.    Status Partially Met               Plan - 08/07/16 1220    Clinical Impression Statement Session completed today as planned without  limitations of fatigue or pain. Patient continues to make progress AEB imrpoved strength in quads and hamstrings. Knees are somewaht more stiff/painful today upon arrival, however, the patient leaves with less pain than when she arrived. ROM remains very limited (flexion/extension) and with valgus deformit noted during AMB.    Rehab Potential Good   Clinical Impairments Affecting Rehab Potential chonicity of impairment   PT Frequency 2x / week   PT Duration 8 weeks   PT Treatment/Interventions ADLs/Self Care Home Management;Electrical Stimulation;Moist Heat;Cryotherapy;Therapeutic exercise;Therapeutic activities;Functional mobility training;Balance training;Gait training;Stair training;Patient/family education   PT Next Visit Plan  knee joint mobilization/manual therapy PRN; repeat quadruped stretching to bilat knees for flexion ROM; isolated quads strengthening. Continue with balance training and SLS on foam surfaces.    PT Home Exercise Plan 06/20/16 (SLR, LAQ, Bridge, Heel Slides); 07/12/2016: controlled STS; 07/24/2016: prone knee hang: 10/27: quadruped knee flexion stretch   Consulted and Agree with Plan of Care Patient      Patient will benefit from skilled therapeutic intervention in order to improve the following deficits and impairments:  Abnormal gait, Cardiopulmonary status limiting activity, Decreased activity tolerance, Decreased balance, Decreased endurance, Decreased range of motion, Decreased strength, Hypomobility, Increased edema, Impaired flexibility, Postural dysfunction, Pain  Visit Diagnosis: Left knee pain, unspecified chronicity  Right knee pain, unspecified chronicity  Muscle weakness (generalized)  Stiffness of right knee, not elsewhere classified     Problem List Patient Active Problem List   Diagnosis Date Noted  . Pain in the chest   . Chest pain 09/03/2014  . HTN (hypertension) 09/03/2014  . Hyperlipidemia 09/03/2014  . Pain in joint, shoulder region  07/10/2012  . 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  . Osteoarthrosis, unspecified whether generalized or localized, lower leg 04/21/2008  . KNEE PAIN 04/21/2008  . HIGH BLOOD PRESSURE 04/20/2008    12:44 PM, 08/07/16 Etta Grandchild, PT, DPT Physical Therapist at [facility] [phone] (office)     [place] [address] [place], [place], [address] Phone: [phone]   Fax:  [phone]  Name: Monique Wagner MRN: [id number] Date of Birth: [birth year]

## 2016-08-09 ENCOUNTER — Ambulatory Visit (HOSPITAL_COMMUNITY): Payer: Medicare Other

## 2016-08-09 DIAGNOSIS — M25562 Pain in left knee: Secondary | ICD-10-CM

## 2016-08-09 DIAGNOSIS — M25661 Stiffness of right knee, not elsewhere classified: Secondary | ICD-10-CM

## 2016-08-09 DIAGNOSIS — M6281 Muscle weakness (generalized): Secondary | ICD-10-CM

## 2016-08-09 DIAGNOSIS — M25561 Pain in right knee: Secondary | ICD-10-CM | POA: Diagnosis not present

## 2016-08-09 NOTE — Therapy (Signed)
[place], [place], [address] Phone: [phone]   Fax:  [phone]  Physical Therapy Treatment  Patient Details  Name: Monique Wagner MRN: [id number] Date of Birth: 1940-10-11 Referring Provider: Hurley Cisco   Encounter Date: 08/09/2016      PT End of Session - 08/09/16 1005    Visit Number 14   Number of Visits 16   Date for PT Re-Evaluation 09/10/2016   Authorization Type BCBS Medicare   Authorization Time Period July 11, 2016-Sep 10, 2016 (G-codes done on 08-08-23)   Authorization - Visit Number 14   Authorization - Number of Visits 17   PT Start Time 0945   PT Stop Time 1025   PT Time Calculation (min) 40 min   Activity Tolerance Patient tolerated treatment well;No increased pain   Behavior During Therapy WFL for tasks assessed/performed      Past Medical History:  Diagnosis Date  . Arthritis   . HTN (hypertension)   . Hyperlipidemia     Past Surgical History:  Procedure Laterality Date  . ABDOMINAL HYSTERECTOMY      There were no vitals filed for this visit.      Subjective Assessment - 08/09/16 0949    Subjective Pt reports a noted improvement in pain and functional activity tolerance since last PT session. She report she left session and did soem shopping, and knee remained feeling good, she just got worn out from so much activity.    Patient Stated Goals Reduce pain and stiffness so she can jump rope or line dance.    Currently in Pain? Yes   Pain Score --  "4.5"   Pain Location --  both knees equally, stiff.    Pain Orientation Right;Left   Pain Descriptors / Indicators --  stiffness                         [facility] Adult PT Treatment/Exercise - 08/09/16 0001      Knee/Hip Exercises: Seated   Long Arc Quad Weights;2 sets;15 reps   Long Arc [employer] --  1st set with 7.5lbs, 2nd set with 10lbs   Heel Slides --  ?x3sH for 3' each side   Heel Slides Limitations seated,  towel under foot   Sit to Sand 10 reps;3 sets;without UE support  19.5" surface     Knee/Hip Exercises: Prone   Hamstring Curl 10 reps;3 sets   Hamstring Curl Limitations 5# weights   Prone Knee Hang 5 minutes;Weights   Prone Knee Hang Weights (lbs) 6x60sec each side  preceding and intermittent HS curls   Prone Knee Hang Limitations towel roll under each distal thigh to avoid patella compression   Straight Leg Raises Limitations Quadruped knee flexion stretch 3x15sec     Manual Therapy   Joint Mobilization TibFem rotational mobilization in extension 1x30sec med/later bilat                  PT Short Term Goals - Aug 07, 2016 1505      PT SHORT TERM GOAL #1   Title After 2 weeks patient will demonstrate independence and compliance with HEP to improve joint range and strength in bilat knees.    Status Achieved     PT SHORT TERM GOAL #2   Title After 4 weeks patient will demonstrate improved functional mobility with 5xSTS<16s and Gait speed >1.76ms to improve access to community.    Baseline Improved: at reassessment 5xSTS<13s, and Gait speed  is >1.39ms    Status Achieved     PT SHORT TERM GOAL #3   Title After 4 weeks pt will demonstrate knee circumference <35cm on R and Knee range of 8-110 bilat to improve performance of stairs.    Baseline Improvements have been good and are borderline to goals, but not quite to the values desired.    Status Partially Met           PT Long Term Goals - 07/17/16 1508      PT LONG TERM GOAL #1   Title After 7 weeks patient will demonstrate improved functional mobility with 5xSTS<13s and Gait speed >1.20 m/s to improve access to community.    Status Achieved     PT LONG TERM GOAL #2   Title After 7 weeks pt will demonstrate knee circumference <34cm on R and Knee range of 5-115 bilat to allow squatting to ground.    Status On-going     PT LONG TERM GOAL #3   Title After 7 weeks patient will demonstrate improved balance with SLS  >30s bilat to decrease falls risk in home.    Baseline At reassessment this is met on right foot. Left foot SLS is less than 15s.    Status Partially Met               Plan - 08/09/16 1009    Clinical Impression Statement Pt continues to demonstrate excellent gains in strength and functional mobility, progressing exercises today. Pt making progress toward most goals. Stiffness again significantly abates with simple repetitive AROM. Attempt to increase knee flexion stretches in quadruped resultant in complete fatigue of BUE requiring MaxA assistance to return to prone position.    Rehab Potential Good   Clinical Impairments Affecting Rehab Potential chonicity of impairment   PT Frequency 2x / week   PT Duration 8 weeks   PT Treatment/Interventions ADLs/Self Care Home Management;Electrical Stimulation;Moist Heat;Cryotherapy;Therapeutic exercise;Therapeutic activities;Functional mobility training;Balance training;Gait training;Stair training;Patient/family education   PT Next Visit Plan  knee joint mobilization/manual therapy PRN; repeat quadruped stretching to bilat knees for flexion ROM; isolated quads strengthening. Continue with balance training and SLS on foam surfaces.    PT Home Exercise Plan 06/20/16 (SLR, LAQ, Bridge, Heel Slides); 07/12/2016: controlled STS; 07/24/2016: prone knee hang: 10/27: quadruped knee flexion stretch   Consulted and Agree with Plan of Care Patient      Patient will benefit from skilled therapeutic intervention in order to improve the following deficits and impairments:  Abnormal gait, Cardiopulmonary status limiting activity, Decreased activity tolerance, Decreased balance, Decreased endurance, Decreased range of motion, Decreased strength, Hypomobility, Increased edema, Impaired flexibility, Postural dysfunction, Pain  Visit Diagnosis: Left knee pain, unspecified chronicity  Right knee pain, unspecified chronicity  Muscle weakness  (generalized)  Stiffness of right knee, not elsewhere classified     Problem List Patient Active Problem List   Diagnosis Date Noted  . Pain in the chest   . Chest pain 09/03/2014  . HTN (hypertension) 09/03/2014  . Hyperlipidemia 09/03/2014  . Pain in joint, shoulder region 07/10/2012  . 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  . Osteoarthrosis, unspecified whether generalized or localized, lower leg 04/21/2008  . KNEE PAIN 04/21/2008  . HIGH BLOOD PRESSURE 04/20/2008    10:26 AM, 08/09/16 AEtta Grandchild PT, DPT [facility] [phone](office)      [place] [address]Phone: [phone]  Fax:  [phone]  Name: Monique Wagner MRN: [id number] Date of Birth: Aug 04, 1941

## 2016-08-14 ENCOUNTER — Ambulatory Visit (HOSPITAL_COMMUNITY): Payer: Medicare Other | Admitting: Physical Therapy

## 2016-08-14 DIAGNOSIS — M25562 Pain in left knee: Secondary | ICD-10-CM

## 2016-08-14 DIAGNOSIS — M25561 Pain in right knee: Secondary | ICD-10-CM | POA: Diagnosis not present

## 2016-08-14 DIAGNOSIS — M6281 Muscle weakness (generalized): Secondary | ICD-10-CM

## 2016-08-14 NOTE — Therapy (Signed)
[place], [place], [address] Phone: [phone]   Fax:  [phone]  Physical Therapy Treatment  Patient Details  Name: Monique Wagner MRN: [id number] Date of Birth: 1941-05-30 Referring Provider: Hurley Cisco   Encounter Date: 08/14/2016      PT End of Session - 08/14/16 1053    Visit Number 15   Number of Visits 16   Date for PT Re-Evaluation 2016/09/11   Authorization Type BCBS Medicare   Authorization Time Period 2016-07-12-2016-09-11 (G-codes done on 08/09/23)   Authorization - Visit Number 15   Authorization - Number of Visits 17   PT Start Time 0950   PT Stop Time 1030   PT Time Calculation (min) 40 min   Activity Tolerance Patient tolerated treatment well;No increased pain   Behavior During Therapy WFL for tasks assessed/performed      Past Medical History:  Diagnosis Date  . Arthritis   . HTN (hypertension)   . Hyperlipidemia     Past Surgical History:  Procedure Laterality Date  . ABDOMINAL HYSTERECTOMY      There were no vitals filed for this visit.      Subjective Assessment - 08/14/16 0952    Subjective Pt reports no pain just alot of stiffness in her bilateral knees and "all over"                         [facility] Adult PT Treatment/Exercise - 08/14/16 0001      Knee/Hip Exercises: Stretches   Active Hamstring Stretch Both;30 seconds;2 reps   Active Hamstring Stretch Limitations 12in step     Knee/Hip Exercises: Standing   SLS 5x on foam mat each   Other Standing Knee Exercises semi- tandem stance on airex 3x 30" no HHA     Knee/Hip Exercises: Seated   Long Arc [employer] sets;15 reps   Long Arc Quad Limitations seated one set 7.5#, one set 10#   Sit to [employer] 10 reps;3 sets;without UE support     Knee/Hip Exercises: Prone   Hamstring Curl 15 reps;2 sets   Hamstring Curl Limitations 5# weights                  PT Short Term Goals - August 08, 2016 1505       PT SHORT TERM GOAL #1   Title After 2 weeks patient will demonstrate independence and compliance with HEP to improve joint range and strength in bilat knees.    Status Achieved     PT SHORT TERM GOAL #2   Title After 4 weeks patient will demonstrate improved functional mobility with 5xSTS<16s and Gait speed >1.56ms to improve access to community.    Baseline Improved: at reassessment 5xSTS<13s, and Gait speed is >1.244m    Status Achieved     PT SHORT TERM GOAL #3   Title After 4 weeks pt will demonstrate knee circumference <35cm on R and Knee range of 8-110 bilat to improve performance of stairs.    Baseline Improvements have been good and are borderline to goals, but not quite to the values desired.    Status Partially Met           PT Long Term Goals - 102017-11-08508      PT LONG TERM GOAL #1   Title After 7 weeks patient will demonstrate improved functional mobility with 5xSTS<13s and Gait speed >1.20 m/s to improve access to community.  Status Achieved     PT LONG TERM GOAL #2   Title After 7 weeks pt will demonstrate knee circumference <34cm on R and Knee range of 5-115 bilat to allow squatting to ground.    Status On-going     PT LONG TERM GOAL #3   Title After 7 weeks patient will demonstrate improved balance with SLS >30s bilat to decrease falls risk in home.    Baseline At reassessment this is met on right foot. Left foot SLS is less than 15s.    Status Partially Met               Plan - 08/14/16 1053    Clinical Impression Statement Continued focus on functional strength and mobiltiy. Progressed SLS to foam surface to increase chhallenge.  Required 2 seated rest breaks during exercises due to fatigue.   Able to complete all therex without difficulty.   Rehab Potential Good   Clinical Impairments Affecting Rehab Potential chonicity of impairment   PT Frequency 2x / week   PT Duration 8 weeks   PT Treatment/Interventions ADLs/Self Care Home  Management;Electrical Stimulation;Moist Heat;Cryotherapy;Therapeutic exercise;Therapeutic activities;Functional mobility training;Balance training;Gait training;Stair training;Patient/family education   PT Next Visit Plan Re-evaluate next session.   PT Home Exercise Plan 06/20/16 (SLR, LAQ, Bridge, Heel Slides); 07/12/2016: controlled STS; 07/24/2016: prone knee hang: 10/27: quadruped knee flexion stretch   Consulted and Agree with Plan of Care Patient      Patient will benefit from skilled therapeutic intervention in order to improve the following deficits and impairments:  Abnormal gait, Cardiopulmonary status limiting activity, Decreased activity tolerance, Decreased balance, Decreased endurance, Decreased range of motion, Decreased strength, Hypomobility, Increased edema, Impaired flexibility, Postural dysfunction, Pain  Visit Diagnosis: Left knee pain, unspecified chronicity  Right knee pain, unspecified chronicity  Muscle weakness (generalized)     Problem List Patient Active Problem List   Diagnosis Date Noted  . Pain in the chest   . Chest pain 09/03/2014  . HTN (hypertension) 09/03/2014  . Hyperlipidemia 09/03/2014  . Pain in joint, shoulder region 07/10/2012  . 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  . Osteoarthrosis, unspecified whether generalized or localized, lower leg 04/21/2008  . KNEE PAIN 04/21/2008  . HIGH BLOOD PRESSURE 04/20/2008    Teena Irani, PTA/CLT [id number]  08/14/2016, 10:56 AM  [place] [address] [place], [place], [address] Phone: [phone]   Fax:  [phone]  Name: Monique Wagner MRN: [id number] Date of Birth: [birth year]

## 2016-08-16 ENCOUNTER — Ambulatory Visit (HOSPITAL_COMMUNITY): Payer: Medicare Other

## 2016-08-16 DIAGNOSIS — M25562 Pain in left knee: Secondary | ICD-10-CM

## 2016-08-16 DIAGNOSIS — M25661 Stiffness of right knee, not elsewhere classified: Secondary | ICD-10-CM

## 2016-08-16 DIAGNOSIS — M25561 Pain in right knee: Secondary | ICD-10-CM

## 2016-08-16 DIAGNOSIS — M6281 Muscle weakness (generalized): Secondary | ICD-10-CM

## 2016-08-16 NOTE — Therapy (Signed)
PHYSICAL THERAPY DISCHARGE SUMMARY  Visits from Start of Care: 16  Current functional level related to goals / functional outcomes: *see below    Remaining deficits: *see below   Education / Equipment: *see below Plan: Patient agrees to discharge.  Patient goals were met. Patient is being discharged due to meeting the stated rehab goals.  ?????         10:38 AM, 08/16/16 Etta Grandchild, PT, DPT Physical Therapist at [facility] [phone] (office)        [place], [place], [address] Phone: [phone]   Fax:  [phone]  Physical Therapy Treatment  Patient Details  Name: Monique Wagner MRN: [id number] Date of Birth: 1941/09/26 Referring Provider: Hurley Cisco   Encounter Date: 08/16/2016      PT End of Session - 08/16/16 1031    Visit Number 16   Number of Visits 16   Date for PT Re-Evaluation September 17, 2016   Authorization Type BCBS Medicare   Authorization Time Period 2016-07-18-Sep 17, 2016 (G-codes done on 2023/08/15)   Authorization - Visit Number 16   Authorization - Number of Visits 17   PT Start Time 0947   PT Stop Time 1030   PT Time Calculation (min) 43 min   Activity Tolerance Patient tolerated treatment well;No increased pain;Patient limited by fatigue   Behavior During Therapy [facility] for tasks assessed/performed      Past Medical History:  Diagnosis Date  . Arthritis   . HTN (hypertension)   . Hyperlipidemia     Past Surgical History:  Procedure Laterality Date  . ABDOMINAL HYSTERECTOMY      There were no vitals filed for this visit.      Subjective Assessment - 08/16/16 1000    Subjective Pt is doign well today, says she continues to notice that she is moving better in general and is really moving more often to keep he rknee from getting stiff. She is excited about progressing activity after DC, but wants to wait until she is cleared by her  rheumatologist prior to starting workouts at the [facility].    How long can you sit comfortably? Not limited (was not limited at evaluation)    How long can you stand comfortably? Not limited (was not limited at evaluation)    How long can you walk comfortably? Not limited (was not limited at evaluation) Squatting and coming off the floor are both limited as they were a evaluation.    Patient Stated Goals Reduce pain and stiffness so she can jump rope or line dance.    Currently in Pain? No/denies            [facility] PT Assessment - 08/16/16 0001      Assessment   Medical Diagnosis bilat knee OA   Referring Provider Hurley Cisco    Onset Date/Surgical Date --  ~10-15 YA   Hand Dominance Right   Next MD Visit September 13, 2016   Prior Therapy None     Precautions   Precautions None     Restrictions   Weight Bearing Restrictions No     Balance Screen   Has the patient fallen in the past 6 months No   Has the patient had a decrease in activity level because of a fear of falling?  No   Is the patient reluctant to leave their home because of a fear of falling?  No     Prior Function  Level of Independence Independent  Still drives and makes groceries.      Observation/Other Assessments-Edema    Edema Circumferential     Circumferential Edema   Circumferential - Right 44cm  47cm at eval   Circumferential - Left  44cm  43cm at eval     PROM   Right Knee Extension 10  14 at evaluation   Right Knee Flexion 97  89 at evaluation   Left Knee Extension 6  11 at evaluation   Left Knee Flexion 100   100 degrees at evaluation     Transfers   Five time sit to stand comments  5xSTS: 9.6s  21s on 9/20; 12.4s on 10/17     Ambulation/Gait   Gait velocity Max gait speed: 1.43ms  1.09m on 9/20; 1.2016mon 10/17   Gait Comments DOE/SOB still most limiting factor     High Level Balance   High Level Balance Comments SLS: R: s; L: 60s  SLS: R: 26.6s; L: 5.26s (9/20) SLS: R:  35s; L: 12s (10/17)                     [facility] Adult PT Treatment/Exercise - 08/16/16 0001      Ambulation/Gait   Ambulation Distance (Feet) 450 Feet     Knee/Hip Exercises: Stretches   Other Knee/Hip Stretches AAROM in BLE to decrease stillness in knees   5 minutes     Knee/Hip Exercises: Supine   Heel Slides --  10x10sec bilat                  PT Short Term Goals - 07/17/16 1505      PT SHORT TERM GOAL #1   Title After 2 weeks patient will demonstrate independence and compliance with HEP to improve joint range and strength in bilat knees.    Status Achieved     PT SHORT TERM GOAL #2   Title After 4 weeks patient will demonstrate improved functional mobility with 5xSTS<16s and Gait speed >1.15m56mo improve access to community.    Baseline Improved: at reassessment 5xSTS<13s, and Gait speed is >1.64m/86m Status Achieved     PT SHORT TERM GOAL #3   Title After 4 weeks pt will demonstrate knee circumference <35cm on R and Knee range of 8-110 bilat to improve performance of stairs.    Baseline Improvements have been good and are borderline to goals, but not quite to the values desired.    Status Partially Met           PT Long Term Goals - 08/16/16 1024      PT LONG TERM GOAL #1   Title After 7 weeks patient will demonstrate improved functional mobility with 5xSTS<13s and Gait speed >1.20 m/s to improve access to community.    Baseline 5xSTS: 9.6; Max gait speed: 1.29m/s23mtatus New     PT LONG TERM GOAL #2   Title After 7 weeks pt will demonstrate knee circumference <44cm on R and Knee range of 5-115 bilat to allow squatting to ground.    Baseline 44cm on 11/16; Range10-96 degrees.    Status Partially Met     PT LONG TERM GOAL #3   Title After 7 weeks patient will demonstrate improved balance with SLS >30s bilat to decrease falls risk in home.    Baseline 60s bilat at DC.    Status Achieved  Plan - 2016/09/03 1032     Clinical Impression Statement Pt reassessed today and is ready for DC. Pt reports she remains most limited by climbing steps. Pt demonstrates achievement of all goals, except for those regarding joitn effusion and ROM which remain somewhat limited. Pt demonstrates strong progress in funcitonal strength AEB 5xSTS, and functional mobility AEB maximal gait speed. Single leg stance balance today is >60s bilat. Pt encouraged to continue with HEP, and transition to [facility] after DC. Pt encouraged to progress daily walking to address DOE.    Rehab Potential Good   Clinical Impairments Affecting Rehab Potential chonicity of impairment   PT Frequency 2x / week   PT Duration 8 weeks   PT Treatment/Interventions ADLs/Self Care Home Management;Electrical Stimulation;Moist Heat;Cryotherapy;Therapeutic exercise;Therapeutic activities;Functional mobility training;Balance training;Gait training;Stair training;Patient/family education   PT Home Exercise Plan 06/20/16 (SLR, LAQ, Bridge, Heel Slides); 07/12/2016: controlled STS; 07/24/2016: prone knee hang: 10/27: quadruped knee flexion stretch   Consulted and Agree with Plan of Care Patient      Patient will benefit from skilled therapeutic intervention in order to improve the following deficits and impairments:  Abnormal gait, Cardiopulmonary status limiting activity, Decreased activity tolerance, Decreased balance, Decreased endurance, Decreased range of motion, Decreased strength, Hypomobility, Increased edema, Impaired flexibility, Postural dysfunction, Pain  Visit Diagnosis: Left knee pain, unspecified chronicity  Right knee pain, unspecified chronicity  Muscle weakness (generalized)  Stiffness of right knee, not elsewhere classified       G-Codes - 2016/09/03 1028    Functional Assessment Tool Used Clinical Judgment    Functional Limitation Mobility: Walking and moving around   Mobility: Walking and Moving Around Goal Status [phone]) At least 1 percent but  less than 20 percent impaired, limited or restricted   Mobility: Walking and Moving Around Discharge Status [phone]) At least 1 percent but less than 20 percent impaired, limited or restricted      Problem List Patient Active Problem List   Diagnosis Date Noted  . Pain in the chest   . Chest pain 09/03/2014  . HTN (hypertension) 09/03/2014  . Hyperlipidemia 09/03/2014  . Pain in joint, shoulder region 07/10/2012  . 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  . Osteoarthrosis, unspecified whether generalized or localized, lower leg 04/21/2008  . KNEE PAIN 04/21/2008  . HIGH BLOOD PRESSURE 04/20/2008    10:38 AM, 2016/09/03 Etta Grandchild, PT, DPT Physical Therapist at [facility] [phone] (office)     [place] [address] [place], [place], [address] Phone: [phone]   Fax:  [phone]  Name: Monique Wagner MRN: [id number] Date of Birth: 19-Mar-1941

## 2016-08-28 ENCOUNTER — Encounter (HOSPITAL_COMMUNITY): Payer: Self-pay

## 2016-08-28 ENCOUNTER — Emergency Department (HOSPITAL_COMMUNITY): Payer: Medicare Other

## 2016-08-28 ENCOUNTER — Emergency Department (HOSPITAL_COMMUNITY)
Admission: EM | Admit: 2016-08-28 | Discharge: 2016-08-28 | Disposition: A | Discharge status: Home / Self Care | Payer: Medicare Other | Attending: Emergency Medicine | Admitting: Emergency Medicine

## 2016-08-28 DIAGNOSIS — I10 Essential (primary) hypertension: Secondary | ICD-10-CM | POA: Diagnosis not present

## 2016-08-28 DIAGNOSIS — R1084 Generalized abdominal pain: Secondary | ICD-10-CM | POA: Diagnosis present

## 2016-08-28 DIAGNOSIS — K5732 Diverticulitis of large intestine without perforation or abscess without bleeding: Secondary | ICD-10-CM | POA: Diagnosis not present

## 2016-08-28 DIAGNOSIS — Z79899 Other long term (current) drug therapy: Secondary | ICD-10-CM | POA: Diagnosis not present

## 2016-08-28 DIAGNOSIS — Z7982 Long term (current) use of aspirin: Secondary | ICD-10-CM | POA: Diagnosis not present

## 2016-08-28 LAB — CBC WITH DIFFERENTIAL/PLATELET
Basophils Absolute: 0 10*3/uL (ref 0.0–0.1)
Basophils Relative: 0 %
Eosinophils Absolute: 0 10*3/uL (ref 0.0–0.7)
Eosinophils Relative: 0 %
HEMATOCRIT: 38.7 % (ref 36.0–46.0)
Hemoglobin: 12.7 g/dL (ref 12.0–15.0)
LYMPHS ABS: 2.1 10*3/uL (ref 0.7–4.0)
LYMPHS PCT: 20 %
MCH: 31.6 pg (ref 26.0–34.0)
MCHC: 32.8 g/dL (ref 30.0–36.0)
MCV: 96.3 fL (ref 78.0–100.0)
MONOS PCT: 2 %
Monocytes Absolute: 0.2 10*3/uL (ref 0.1–1.0)
NEUTROS ABS: 8 10*3/uL — AB (ref 1.7–7.7)
Neutrophils Relative %: 78 %
Platelets: 279 10*3/uL (ref 150–400)
RBC: 4.02 MIL/uL (ref 3.87–5.11)
RDW: 15 % (ref 11.5–15.5)
WBC: 10.3 10*3/uL (ref 4.0–10.5)

## 2016-08-28 LAB — URINE MICROSCOPIC-ADD ON

## 2016-08-28 LAB — COMPREHENSIVE METABOLIC PANEL
ALT: 19 U/L (ref 14–54)
AST: 22 U/L (ref 15–41)
Albumin: 3.9 g/dL (ref 3.5–5.0)
Alkaline Phosphatase: 42 U/L (ref 38–126)
Anion gap: 9 (ref 5–15)
BILIRUBIN TOTAL: 0.7 mg/dL (ref 0.3–1.2)
BUN: 14 mg/dL (ref 6–20)
CO2: 29 mmol/L (ref 22–32)
Calcium: 9.5 mg/dL (ref 8.9–10.3)
Chloride: 99 mmol/L — ABNORMAL LOW (ref 101–111)
Creatinine, Ser: 1.03 mg/dL — ABNORMAL HIGH (ref 0.44–1.00)
GFR, EST NON AFRICAN AMERICAN: 52 mL/min — AB (ref >60)
Glucose, Bld: 118 mg/dL — ABNORMAL HIGH (ref 65–99)
POTASSIUM: 3.1 mmol/L — AB (ref 3.5–5.1)
Sodium: 137 mmol/L (ref 135–145)
TOTAL PROTEIN: 7 g/dL (ref 6.5–8.1)

## 2016-08-28 LAB — URINALYSIS, ROUTINE W REFLEX MICROSCOPIC
BILIRUBIN URINE: NEGATIVE
Glucose, UA: NEGATIVE mg/dL
Ketones, ur: NEGATIVE mg/dL
NITRITE: NEGATIVE
PROTEIN: NEGATIVE mg/dL
Specific Gravity, Urine: 1.02 (ref 1.005–1.030)
pH: 6 (ref 5.0–8.0)

## 2016-08-28 LAB — LIPASE, BLOOD: Lipase: 15 U/L (ref 11–51)

## 2016-08-28 MED ORDER — CIPROFLOXACIN IN D5W 400 MG/200ML IV SOLN
400.0000 mg | Freq: Once | INTRAVENOUS | Status: AC
  Administered 2016-08-28: 400 mg via INTRAVENOUS
  Filled 2016-08-28: qty 200

## 2016-08-28 MED ORDER — IOPAMIDOL (ISOVUE-300) INJECTION 61%
100.0000 mL | Freq: Once | INTRAVENOUS | Status: AC | PRN
  Administered 2016-08-28: 100 mL via INTRAVENOUS

## 2016-08-28 MED ORDER — METRONIDAZOLE IN NACL 5-0.79 MG/ML-% IV SOLN
500.0000 mg | Freq: Once | INTRAVENOUS | Status: AC
  Administered 2016-08-28: 500 mg via INTRAVENOUS
  Filled 2016-08-28: qty 100

## 2016-08-28 MED ORDER — HYDROCODONE-ACETAMINOPHEN 5-325 MG PO TABS: ORAL_TABLET | ORAL | 0 refills | Status: DC

## 2016-08-28 MED ORDER — POTASSIUM CHLORIDE CRYS ER 20 MEQ PO TBCR
40.0000 meq | EXTENDED_RELEASE_TABLET | Freq: Once | ORAL | Status: AC
  Administered 2016-08-28: 40 meq via ORAL
  Filled 2016-08-28: qty 2

## 2016-08-28 MED ORDER — CIPROFLOXACIN HCL 500 MG PO TABS: 500.0000 mg | ORAL_TABLET | Freq: Two times a day (BID) | ORAL | 0 refills | Status: DC

## 2016-08-28 MED ORDER — METRONIDAZOLE 500 MG PO TABS: 500.0000 mg | ORAL_TABLET | Freq: Three times a day (TID) | ORAL | 0 refills | Status: DC

## 2016-08-28 MED ORDER — IOPAMIDOL (ISOVUE-300) INJECTION 61%
INTRAVENOUS | Status: AC
  Administered 2016-08-28: 30 mL
  Filled 2016-08-28: qty 30

## 2016-08-28 NOTE — ED Triage Notes (Signed)
Pt reports intermittent lower abd pain x 1 week but worse since last night. Denies n/v/d.  LBM was this morning.  Pt says had 3 bms and they were normal.  Denies any vaginal bleeding or discharge.  Denies any pain with urination.

## 2016-08-28 NOTE — ED Provider Notes (Signed)
[facility]-EMER[facility]ENCY DEPT Provider Note   CSN: [id number] Arrival date & time: 08/28/16  0804     History   Chief Complaint Chief Complaint  Patient presents with  . Abdominal Pain    HPI Monique Wagner is a 75 y.o. female.  HPI  Pt was seen at 0830.  Per pt, c/o gradual onset and persistence of constant generalized abd "pain" for the past 1 week, worse since yesterday.  Has been associated with no other symptoms. Denies N/V, no diarrhea, no fevers, no back pain, no rash, no CP/SOB, no black or blood in stools.      Past Medical History:  Diagnosis Date  . Arthritis   . HTN (hypertension)   . Hyperlipidemia     Patient Active Problem List   Diagnosis Date Noted  . Pain in the chest   . Chest pain 09/03/2014  . HTN (hypertension) 09/03/2014  . Hyperlipidemia 09/03/2014  . Pain in joint, shoulder region 07/10/2012  . 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  . Osteoarthrosis, unspecified whether generalized or localized, lower leg 04/21/2008  . KNEE PAIN 04/21/2008  . HIGH BLOOD PRESSURE 04/20/2008    Past Surgical History:  Procedure Laterality Date  . ABDOMINAL HYSTERECTOMY    . BREAST SURGERY      OB History    No data available       Home Medications    Prior to Admission medications   Medication Sig Start Date End Date Taking? Authorizing Provider  aspirin 325 MG EC tablet Take 325 mg by mouth daily.    Historical Provider, MD  atorvastatin (LIPITOR) 20 MG tablet Take 20 mg by mouth daily.    Historical Provider, MD  carvedilol (COREG CR) 20 MG 24 hr capsule Take 20 mg by mouth daily.    Historical Provider, MD  losartan-hydrochlorothiazide (HYZAAR) 50-12.5 MG per tablet Take 1 tablet by mouth daily.    Historical Provider, MD  Multiple Vitamins-Minerals (MULTIVITAMIN PO) Take 1 tablet by mouth daily.     Historical Provider, MD  Omega-3 Fatty Acids (FISH OIL PO) Take 1 capsule by mouth daily.     Historical Provider, MD  predniSONE (DELTASONE) 10 MG  tablet Take 1 tablet by mouth daily. 08/04/14   Historical Provider, MD    Family History Family History  Problem Relation Age of Onset  . Arthritis    . Cancer    . Diabetes    . Heart disease Other     Social History Social History  Substance Use Topics  . Smoking status: Never Smoker  . Smokeless tobacco: Never Used  . Alcohol use No     Allergies   Patient has no known allergies.   Review of Systems Review of Systems ROS: Statement: All systems negative except as marked or noted in the HPI; Constitutional: Negative for fever and chills. ; ; Eyes: Negative for eye pain, redness and discharge. ; ; ENMT: Negative for ear pain, hoarseness, nasal congestion, sinus pressure and sore throat. ; ; Cardiovascular: Negative for chest pain, palpitations, diaphoresis, dyspnea and peripheral edema. ; ; Respiratory: Negative for cough, wheezing and stridor. ; ; Gastrointestinal: +abd pain. Negative for nausea, vomiting, diarrhea, blood in stool, hematemesis, jaundice and rectal bleeding. . ; ; Genitourinary: Negative for dysuria, flank pain and hematuria. ; ; GYN:  No pelvic pain, no vaginal bleeding, no vaginal discharge, no vulvar pain. ;; Musculoskeletal: Negative for back pain and neck pain. Negative for swelling and trauma.; ; Skin: Negative for pruritus,  rash, abrasions, blisters, bruising and skin lesion.; ; Neuro: Negative for headache, lightheadedness and neck stiffness. Negative for weakness, altered level of consciousness, altered mental status, extremity weakness, paresthesias, involuntary movement, seizure and syncope.       Physical Exam Updated Vital Signs BP 140/62 (BP Location: Left Arm)   Pulse 71   Temp 98.4 F (36.9 C) (Oral)   Resp 20   Ht 5\' 4"  (1.626 m)   Wt 174 lb (78.9 kg)   SpO2 97%   BMI 29.87 kg/m    BP 123/66 (BP Location: Left Arm)   Pulse 68   Temp 98.4 F (36.9 C) (Oral)   Resp 20   Ht 5\' 4"  (1.626 m)   Wt 174 lb (78.9 kg)   SpO2 100%   BMI  29.87 kg/m    Physical Exam 0835: Physical examination:  Nursing notes reviewed; Vital signs and O2 SAT reviewed;  Constitutional: Well developed, Well nourished, Well hydrated, In no acute distress; Head:  Normocephalic, atraumatic; Eyes: EOMI, PERRL, No scleral icterus; ENMT: Mouth and pharynx normal, Mucous membranes moist; Neck: Supple, Full range of motion, No lymphadenopathy; Cardiovascular: Regular rate and rhythm, No gallop; Respiratory: Breath sounds clear & equal bilaterally, No wheezes.  Speaking full sentences with ease, Normal respiratory effort/excursion; Chest: Nontender, Movement normal; Abdomen: Soft, +mild LLQ > RLQ tenderness to palp. No rebound or guarding. Nondistended, Normal bowel sounds; Genitourinary: No CVA tenderness; Extremities: Pulses normal, No tenderness, No edema, No calf edema or asymmetry.; Neuro: AA&Ox3, Major CN grossly intact.  Speech clear. No gross focal motor or sensory deficits in extremities.; Skin: Color normal, Warm, Dry.   ED Treatments / Results  Labs (all labs ordered are listed, but only abnormal results are displayed)   EKG  EKG Interpretation None       Radiology   Procedures Procedures (including critical care time)  Medications Ordered in ED Medications - No data to display   Initial Impression / Assessment and Plan / ED Course  I have reviewed the triage vital signs and the nursing notes.  Pertinent labs & imaging results that were available during my care of the patient were reviewed by me and considered in my medical decision making (see chart for details).  MDM Reviewed: previous chart, nursing note and vitals Reviewed previous: labs Interpretation: labs and CT scan   Results for orders placed or performed during the hospital encounter of 08/28/16  CBC with Differential  Result Value Ref Range   WBC 10.3 4.0 - 10.5 K/uL   RBC 4.02 3.87 - 5.11 MIL/uL   Hemoglobin 12.7 12.0 - 15.0 g/dL   HCT 16.138.7 09.636.0 - 04.546.0 %    MCV 96.3 78.0 - 100.0 fL   MCH 31.6 26.0 - 34.0 pg   MCHC 32.8 30.0 - 36.0 g/dL   RDW 40.915.0 81.111.5 - 91.415.5 %   Platelets 279 150 - 400 K/uL   Neutrophils Relative % 78 %   Neutro Abs 8.0 (H) 1.7 - 7.7 K/uL   Lymphocytes Relative 20 %   Lymphs Abs 2.1 0.7 - 4.0 K/uL   Monocytes Relative 2 %   Monocytes Absolute 0.2 0.1 - 1.0 K/uL   Eosinophils Relative 0 %   Eosinophils Absolute 0.0 0.0 - 0.7 K/uL   Basophils Relative 0 %   Basophils Absolute 0.0 0.0 - 0.1 K/uL  Comprehensive metabolic panel  Result Value Ref Range   Sodium 137 135 - 145 mmol/L   Potassium 3.1 (L) 3.5 - 5.1  mmol/L   Chloride 99 (L) 101 - 111 mmol/L   CO2 29 22 - 32 mmol/L   Glucose, Bld 118 (H) 65 - 99 mg/dL   BUN 14 6 - 20 mg/dL   Creatinine, Ser 6.96 (H) 0.44 - 1.00 mg/dL   Calcium 9.5 8.9 - 29.5 mg/dL   Total Protein 7.0 6.5 - 8.1 g/dL   Albumin 3.9 3.5 - 5.0 g/dL   AST 22 15 - 41 U/L   ALT 19 14 - 54 U/L   Alkaline Phosphatase 42 38 - 126 U/L   Total Bilirubin 0.7 0.3 - 1.2 mg/dL   GFR calc non Af Amer 52 (L) >60 mL/min   GFR calc Af Amer >60 >60 mL/min   Anion gap 9 5 - 15  Urinalysis, Routine w reflex microscopic (not at [facility])  Result Value Ref Range   Color, Urine YELLOW YELLOW   APPearance HAZY (A) CLEAR   Specific Gravity, Urine 1.020 1.005 - 1.030   pH 6.0 5.0 - 8.0   Glucose, UA NEGATIVE NEGATIVE mg/dL   Hgb urine dipstick TRACE (A) NEGATIVE   Bilirubin Urine NEGATIVE NEGATIVE   Ketones, ur NEGATIVE NEGATIVE mg/dL   Protein, ur NEGATIVE NEGATIVE mg/dL   Nitrite NEGATIVE NEGATIVE   Leukocytes, UA TRACE (A) NEGATIVE  Lipase, blood  Result Value Ref Range   Lipase 15 11 - 51 U/L  Urine microscopic-add on  Result Value Ref Range   Squamous Epithelial / LPF 6-30 (A) NONE SEEN   WBC, UA 0-5 0 - 5 WBC/hpf   RBC / HPF 0-5 0 - 5 RBC/hpf   Bacteria, UA FEW (A) NONE SEEN   Ct Abdomen Pelvis W Contrast Result Date: 08/28/2016 CLINICAL DATA:  Patient with lower pelvic pain for 1 week, worsening at  night. EXAM: CT ABDOMEN AND PELVIS WITH CONTRAST TECHNIQUE: Multidetector CT imaging of the abdomen and pelvis was performed using the standard protocol following bolus administration of intravenous contrast. CONTRAST:  30mL ISOVUE-300 IOPAMIDOL (ISOVUE-300) INJECTION 61%, ISOVUE-300 IOPAMIDOL (ISOVUE-300) INJECTION 61% COMPARISON:  None. FINDINGS: Lower chest: Visualized lung bases are clear. Hepatobiliary: Within the lateral left hepatic lobe there is a 2.8 x 2.2 cm peripheral enhancing mass (image 10; series 2), most compatible with hemangioma. There is an indeterminate 11 mm enhancing mass within the right hepatic lobe (image 21; series 2). There is a 1.5 cm peripheral enhancing mass within the right hepatic lobe (image 15; series 2), potentially representing hemangioma. Additional flash filling mass within the hepatic dome measuring 1.6 cm (image 3; series 2). Additionally along the posterior aspect of the right hepatic lobe/hepatic dome there is a 4.8 x 2.7 cm low-attenuation lobular mass (image 5; series 2). Gallbladder is unremarkable. No intrahepatic or extrahepatic biliary ductal dilatation. Pancreas: Unremarkable Spleen: Unremarkable Adrenals/Urinary Tract: Normal adrenal glands. Kidneys enhance symmetrically with contrast. Too small to characterize sub cm low-attenuation lesions within the left kidney. Stomach/Bowel: There is focal wall thickening of the sigmoid colon (image 61; series 2) with surrounding fat stranding (image 60; series 7). No evidence for upstream bowel obstruction. Normal morphology of the stomach. Vascular/Lymphatic: Normal caliber abdominal aorta. Peripheral calcified atherosclerotic plaque. No retroperitoneal lymphadenopathy. Reproductive: Patient status post hysterectomy. Adnexal structures are unremarkable. Other: Bilateral fat containing inguinal hernias. Musculoskeletal: Lower thoracic and lumbar spine degenerative changes. No aggressive or acute appearing osseous lesions.  IMPRESSION: Eccentric wall thickening of the sigmoid colon with surrounding fat stranding concerning for diverticulitis/focal colitis. After resolution of the acute symptomatology, if not  previously performed, recommend correlation with colonoscopy to exclude the possibility of underlying colonic mass. Multiple liver lesions as described above. Many of these lesions are favored to represent hemangiomas however there is an indeterminate 4.8 cm lobular low-attenuation lesion within the hepatic dome. This needs dedicated evaluation/characterization with hepatic MRI. Aortic atherosclerosis. Electronically Signed   By: Annia Beltrew  Davis M.D.   On: 08/28/2016 10:59    1240:  Potassium repleted PO. Pt has tol PO well while in the ED without N/V.  No stooling while in the ED. WBC count normal, VS remain stable. States she feels better and wants to go home now. Offered admission, prefers to go home and will return prn. Strict return precautions given. IV abx given before d/c. Dx and testing d/w pt and family.  Questions answered.  Verb understanding, agreeable to d/c home with outpt f/u.   Final Clinical Impressions(s) / ED Diagnoses   Final diagnoses:  None    New Prescriptions New Prescriptions   No medications on file     Samuel JesterKathleen Evertt Chouinard, DO 09/01/16 0020

## 2016-08-28 NOTE — Discharge Instructions (Signed)
Your CT scan showed in incidental finding of:  "Multiple liver lesions as described above. Many of these lesions are favored to represent hemangiomas however there is an indeterminate 4.8 cm lobular low-attenuation lesion within the hepatic dome. This needs dedicated evaluation/characterization with hepatic MRI."  Take the prescriptions as directed.   Eat a bland diet and advance to your regular diet slowly as you can tolerate it. Call your regular medical doctor today to schedule a follow up appointment in the next 24 to 48 hours. Call the GI doctor today to schedule a follow up appointment within the next week; you will need a colonoscopy after your acute symptoms subside.  Return to the Emergency Department immediately if not improving (or even worsening) despite taking the medicines as prescribed, any black or bloody stool or vomit, if you develop a fever over "101," or for any other concerns.

## 2016-08-29 LAB — URINE CULTURE

## 2017-02-26 ENCOUNTER — Encounter (HOSPITAL_COMMUNITY): Payer: Self-pay

## 2017-02-26 ENCOUNTER — Emergency Department (HOSPITAL_COMMUNITY): Payer: Medicare Other

## 2017-02-26 ENCOUNTER — Emergency Department (HOSPITAL_COMMUNITY)
Admission: EM | Admit: 2017-02-26 | Discharge: 2017-02-26 | Disposition: A | Discharge status: Home / Self Care | Payer: Medicare Other | Attending: Emergency Medicine | Admitting: Emergency Medicine

## 2017-02-26 DIAGNOSIS — Z79899 Other long term (current) drug therapy: Secondary | ICD-10-CM | POA: Insufficient documentation

## 2017-02-26 DIAGNOSIS — K5730 Diverticulosis of large intestine without perforation or abscess without bleeding: Secondary | ICD-10-CM | POA: Insufficient documentation

## 2017-02-26 DIAGNOSIS — R1030 Lower abdominal pain, unspecified: Secondary | ICD-10-CM

## 2017-02-26 DIAGNOSIS — Z7982 Long term (current) use of aspirin: Secondary | ICD-10-CM | POA: Diagnosis not present

## 2017-02-26 DIAGNOSIS — I10 Essential (primary) hypertension: Secondary | ICD-10-CM | POA: Diagnosis not present

## 2017-02-26 DIAGNOSIS — R102 Pelvic and perineal pain: Secondary | ICD-10-CM | POA: Diagnosis present

## 2017-02-26 HISTORY — DX: Diverticulitis of intestine, part unspecified, without perforation or abscess without bleeding: K57.92

## 2017-02-26 LAB — COMPREHENSIVE METABOLIC PANEL
ALK PHOS: 41 U/L (ref 38–126)
ALT: 26 U/L (ref 14–54)
AST: 31 U/L (ref 15–41)
Albumin: 4.1 g/dL (ref 3.5–5.0)
Anion gap: 11 (ref 5–15)
BUN: 19 mg/dL (ref 6–20)
CALCIUM: 9.6 mg/dL (ref 8.9–10.3)
CHLORIDE: 97 mmol/L — AB (ref 101–111)
CO2: 28 mmol/L (ref 22–32)
CREATININE: 1.18 mg/dL — AB (ref 0.44–1.00)
GFR calc non Af Amer: 44 mL/min — ABNORMAL LOW (ref >60)
GFR, EST AFRICAN AMERICAN: 51 mL/min — AB (ref >60)
GLUCOSE: 118 mg/dL — AB (ref 65–99)
Potassium: 3.1 mmol/L — ABNORMAL LOW (ref 3.5–5.1)
SODIUM: 136 mmol/L (ref 135–145)
Total Bilirubin: 1 mg/dL (ref 0.3–1.2)
Total Protein: 7.2 g/dL (ref 6.5–8.1)

## 2017-02-26 LAB — CBC WITH DIFFERENTIAL/PLATELET
BASOS ABS: 0 10*3/uL (ref 0.0–0.1)
Basophils Relative: 0 %
EOS ABS: 0 10*3/uL (ref 0.0–0.7)
EOS PCT: 0 %
HCT: 38.7 % (ref 36.0–46.0)
HEMOGLOBIN: 12.8 g/dL (ref 12.0–15.0)
LYMPHS ABS: 1.9 10*3/uL (ref 0.7–4.0)
LYMPHS PCT: 14 %
MCH: 31.7 pg (ref 26.0–34.0)
MCHC: 33.1 g/dL (ref 30.0–36.0)
MCV: 95.8 fL (ref 78.0–100.0)
Monocytes Absolute: 0.4 10*3/uL (ref 0.1–1.0)
Monocytes Relative: 3 %
NEUTROS PCT: 83 %
Neutro Abs: 11.6 10*3/uL — ABNORMAL HIGH (ref 1.7–7.7)
PLATELETS: 238 10*3/uL (ref 150–400)
RBC: 4.04 MIL/uL (ref 3.87–5.11)
RDW: 15 % (ref 11.5–15.5)
WBC: 13.9 10*3/uL — ABNORMAL HIGH (ref 4.0–10.5)

## 2017-02-26 LAB — URINALYSIS, ROUTINE W REFLEX MICROSCOPIC
Bilirubin Urine: NEGATIVE
Glucose, UA: NEGATIVE mg/dL
Ketones, ur: NEGATIVE mg/dL
LEUKOCYTES UA: NEGATIVE
NITRITE: NEGATIVE
PROTEIN: NEGATIVE mg/dL
Specific Gravity, Urine: 1.025 (ref 1.005–1.030)
pH: 5.5 (ref 5.0–8.0)

## 2017-02-26 LAB — LIPASE, BLOOD: LIPASE: 15 U/L (ref 11–51)

## 2017-02-26 LAB — URINALYSIS, MICROSCOPIC (REFLEX): WBC UA: NONE SEEN WBC/hpf (ref 0–5)

## 2017-02-26 MED ORDER — IOPAMIDOL (ISOVUE-300) INJECTION 61%
100.0000 mL | Freq: Once | INTRAVENOUS | Status: AC | PRN
  Administered 2017-02-26: 100 mL via INTRAVENOUS

## 2017-02-26 MED ORDER — POTASSIUM CHLORIDE CRYS ER 20 MEQ PO TBCR
40.0000 meq | EXTENDED_RELEASE_TABLET | Freq: Once | ORAL | Status: AC
  Administered 2017-02-26: 40 meq via ORAL
  Filled 2017-02-26: qty 2

## 2017-02-26 MED ORDER — SODIUM CHLORIDE 0.9 % IV BOLUS (SEPSIS)
500.0000 mL | Freq: Once | INTRAVENOUS | Status: AC
  Administered 2017-02-26: 500 mL via INTRAVENOUS

## 2017-02-26 NOTE — ED Triage Notes (Signed)
Pt reports lower abd pain and nausea since 9pm last night.  Reports history of diverticulitis.  Denies diarrhea.  LBM was yesterday and was normal.

## 2017-02-26 NOTE — ED Provider Notes (Signed)
[facility]-EMERGENCY DEPT Provider Note   CSN: [id number] Arrival date & time: 02/26/17  0847  By signing my name below, I, Thelma Bargeick Cochran, attest that this documentation has been prepared under the direction and in the presence of Blane OharaZavitz, Lane Kjos, MD. Electronically Signed: Thelma BargeNick Cochran, Scribe. 02/26/17. 9:13 AM.  History   Chief Complaint Chief Complaint  Patient presents with  . Abdominal Pain   The history is provided by the patient. No language interpreter was used.    HPI Comments: Monique Wagner is a 76 y.o. female with a PMHx of diverticulitis and HTN who presents to the Emergency Department complaining of constant, gradually worsening lower abdominal pain since last night. She has associated chills and vomiting. She denies fever, diarrhea, blood in stool, CP, and SOB. Pt notes no recent pertinent surgeries.   Past Medical History:  Diagnosis Date  . Arthritis   . Diverticulitis   . HTN (hypertension)   . Hyperlipidemia     Patient Active Problem List   Diagnosis Date Noted  . Pain in the chest   . Chest pain 09/03/2014  . HTN (hypertension) 09/03/2014  . Hyperlipidemia 09/03/2014  . Pain in joint, shoulder region 07/10/2012  . 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  . Osteoarthrosis, unspecified whether generalized or localized, lower leg 04/21/2008  . KNEE PAIN 04/21/2008  . HIGH BLOOD PRESSURE 04/20/2008    Past Surgical History:  Procedure Laterality Date  . ABDOMINAL HYSTERECTOMY    . BREAST SURGERY      OB History    No data available       Home Medications    Prior to Admission medications   Medication Sig Start Date End Date Taking? Authorizing Provider  aspirin 325 MG EC tablet Take 325 mg by mouth daily.    [provider]  carvedilol (COREG CR) 20 MG 24 hr capsule Take 20 mg by mouth daily.    [provider]  ciprofloxacin (CIPRO) 500 MG tablet Take 1 tablet (500 mg total) by mouth 2 (two) times daily. 08/28/16   Samuel JesterMcManus,  Kathleen, DO  folic acid (FOLVITE) 1 MG tablet Take 1 mg by mouth daily. 07/18/16   [provider]  HYDROcodone-acetaminophen (NORCO/VICODIN) 5-325 MG tablet 1 tab PO q8 hours prn pain 08/28/16   Samuel JesterMcManus, Kathleen, DO  losartan-hydrochlorothiazide (HYZAAR) 50-12.5 MG per tablet Take 1 tablet by mouth daily.    [provider]  methotrexate (RHEUMATREX) 2.5 MG tablet Take 10 mg by mouth 2 (two) times a week. 07/21/16   [provider]  metroNIDAZOLE (FLAGYL) 500 MG tablet Take 1 tablet (500 mg total) by mouth 3 (three) times daily. 08/28/16   Samuel JesterMcManus, Kathleen, DO  Multiple Vitamins-Minerals (MULTIVITAMIN PO) Take 1 tablet by mouth daily.     [provider]  Omega-3 Fatty Acids (FISH OIL PO) Take 1 capsule by mouth daily.     [provider]  predniSONE (DELTASONE) 10 MG tablet Take 1 tablet by mouth daily as needed. arthritis 08/04/14   [provider]  sodium chloride (MURO 128) 5 % ophthalmic solution Place 1 drop into both eyes 3 (three) times daily. 07/17/16   [provider]  triamterene-hydrochlorothiazide (MAXZIDE-25) 37.5-25 MG tablet Take 1 tablet by mouth daily. 06/17/16   [provider]    Family History Family History  Problem Relation Age of Onset  . Arthritis Unknown   . Cancer Unknown   . Diabetes Unknown   . Heart disease Other     Social  History Social History  Substance Use Topics  . Smoking status: Never Smoker  . Smokeless tobacco: Never Used  . Alcohol use No     Allergies   Patient has no known allergies.   Review of Systems Review of Systems  Constitutional: Positive for chills. Negative for fever.  Respiratory: Negative for shortness of breath.   Cardiovascular: Negative for chest pain.  Gastrointestinal: Positive for abdominal pain and vomiting. Negative for blood in stool and diarrhea.  All other systems reviewed and are negative.    Physical Exam Updated Vital Signs BP  121/69 (BP Location: Left Arm)   Pulse 77   Temp 98.2 F (36.8 C) (Oral)   Resp 20   Ht 5\' 3"  (1.6 m)   Wt 173 lb (78.5 kg)   SpO2 96%   BMI 30.65 kg/m   Physical Exam  Constitutional: She is oriented to person, place, and time. She appears well-developed and well-nourished.  HENT:  Head: Normocephalic and atraumatic.  Cardiovascular: Normal rate.   Pulmonary/Chest: Effort normal and breath sounds normal.  No breathing difficulty   Abdominal: There is tenderness. There is no guarding.  Mild tenderness in pelvic region No guarding   Musculoskeletal: She exhibits no edema.  No leg swelling  Neurological: She is alert and oriented to person, place, and time.  Skin: Skin is warm and dry.  2+ Radial  pulses   Psychiatric: She has a normal mood and affect.  Nursing note and vitals reviewed.    ED Treatments / Results  DIAGNOSTIC STUDIES: Oxygen Saturation is 96% on RA, normal by my interpretation.    COORDINATION OF CARE: 9:13 AM Discussed treatment plan with pt at bedside and pt agreed to plan.  Labs (all labs ordered are listed, but only abnormal results are displayed) Labs Reviewed  CBC WITH DIFFERENTIAL/PLATELET  COMPREHENSIVE METABOLIC PANEL  URINALYSIS, ROUTINE W REFLEX MICROSCOPIC    EKG  EKG Interpretation None       Radiology No results found.  Procedures Procedures (including critical care time)  Medications Ordered in ED Medications - No data to display   Initial Impression / Assessment and Plan / ED Course  I have reviewed the triage vital signs and the nursing notes.  Pertinent labs & imaging results that were available during my care of the patient were reviewed by me and considered in my medical decision making (see chart for details).    Well-appearing patient presents with lower abdominal pain concern for mild colitis with diverticulitis. CT scan showed diverticulosis without signs infection. Patient needs outpatient follow for  colonoscopy. Pain controlled in the ER patient does not want pain medicines. Urinalysis sent for culture.  Results and differential diagnosis were discussed with the patient/parent/guardian. Xrays were independently reviewed by myself.  Close follow up outpatient was discussed, comfortable with the plan.   Medications  potassium chloride SA (K-DUR,KLOR-CON) CR tablet 40 mEq (not administered)  sodium chloride 0.9 % bolus 500 mL (0 mLs Intravenous Stopped 02/26/17 1017)  iopamidol (ISOVUE-300) 61 % injection 100 mL (100 mLs Intravenous Contrast Given 02/26/17 1047)    Vitals:   02/26/17 0855 02/26/17 0856 02/26/17 1100  BP: 121/69  (!) 102/53  Pulse: 77  68  Resp: 20    Temp: 98.2 F (36.8 C)    TempSrc: Oral    SpO2: 96%  100%  Weight:  78.5 kg (173 lb)   Height:  5\' 3"  (1.6 m)     Final diagnoses:  Lower abdominal pain  Diverticulosis of colon     Final Clinical Impressions(s) / ED Diagnoses   Final diagnoses:  None    New Prescriptions New Prescriptions   No medications on file     Blane Ohara, MD 02/26/17 1210

## 2017-02-26 NOTE — ED Notes (Signed)
Pt taken to ct 

## 2017-02-26 NOTE — Discharge Instructions (Signed)
Please discuss colonoscopy with your primary doctor and gastroenterology. Take Tylenol as needed for pain.  If you were given medicines take as directed.  If you are on coumadin or contraceptives realize their levels and effectiveness is altered by many different medicines.  If you have any reaction (rash, tongues swelling, other) to the medicines stop taking and see a physician.    If your blood pressure was elevated in the ER make sure you follow up for management with a primary doctor or return for chest pain, shortness of breath or stroke symptoms.  Please follow up as directed and return to the ER or see a physician for new or worsening symptoms.  Thank you. Vitals:   02/26/17 0855 02/26/17 0856 02/26/17 1100  BP: 121/69  (!) 102/53  Pulse: 77  68  Resp: 20    Temp: 98.2 F (36.8 C)    TempSrc: Oral    SpO2: 96%  100%  Weight:  78.5 kg (173 lb)   Height:  5\' 3"  (1.6 m)

## 2017-03-01 LAB — URINE CULTURE

## 2017-03-02 ENCOUNTER — Telehealth: Payer: Self-pay

## 2017-03-02 NOTE — Telephone Encounter (Signed)
No treatment needed for [facility] ED 02/26/17 Per Loma SousaJOrdon Russo PA-C

## 2017-04-11 ENCOUNTER — Other Ambulatory Visit (HOSPITAL_COMMUNITY): Payer: Self-pay | Admitting: Family Medicine

## 2017-04-11 DIAGNOSIS — Z1231 Encounter for screening mammogram for malignant neoplasm of breast: Secondary | ICD-10-CM

## 2017-05-09 ENCOUNTER — Ambulatory Visit (HOSPITAL_COMMUNITY)
Admission: RE | Admit: 2017-05-09 | Discharge: 2017-05-09 | Disposition: A | Discharge status: Home / Self Care | Payer: Medicare Other | Source: Ambulatory Visit | Attending: Family Medicine | Admitting: Family Medicine

## 2017-05-09 DIAGNOSIS — Z1231 Encounter for screening mammogram for malignant neoplasm of breast: Secondary | ICD-10-CM | POA: Insufficient documentation

## 2017-05-13 ENCOUNTER — Other Ambulatory Visit (HOSPITAL_COMMUNITY): Payer: Self-pay | Admitting: Family Medicine

## 2017-05-13 DIAGNOSIS — R928 Other abnormal and inconclusive findings on diagnostic imaging of breast: Secondary | ICD-10-CM

## 2017-05-28 ENCOUNTER — Ambulatory Visit (HOSPITAL_COMMUNITY)
Admission: RE | Admit: 2017-05-28 | Discharge: 2017-05-28 | Disposition: A | Discharge status: Home / Self Care | Payer: Medicare Other | Source: Ambulatory Visit | Attending: Family Medicine | Admitting: Family Medicine

## 2017-05-28 DIAGNOSIS — R928 Other abnormal and inconclusive findings on diagnostic imaging of breast: Secondary | ICD-10-CM | POA: Insufficient documentation

## 2017-08-03 ENCOUNTER — Emergency Department (HOSPITAL_COMMUNITY)
Admission: EM | Admit: 2017-08-03 | Discharge: 2017-08-03 | Disposition: A | Discharge status: Home / Self Care | Payer: Medicare Other | Attending: Emergency Medicine | Admitting: Emergency Medicine

## 2017-08-03 ENCOUNTER — Emergency Department (HOSPITAL_COMMUNITY): Payer: Medicare Other

## 2017-08-03 ENCOUNTER — Encounter (HOSPITAL_COMMUNITY): Payer: Self-pay | Admitting: Emergency Medicine

## 2017-08-03 DIAGNOSIS — K5792 Diverticulitis of intestine, part unspecified, without perforation or abscess without bleeding: Secondary | ICD-10-CM | POA: Diagnosis not present

## 2017-08-03 DIAGNOSIS — Z7982 Long term (current) use of aspirin: Secondary | ICD-10-CM | POA: Diagnosis not present

## 2017-08-03 DIAGNOSIS — I10 Essential (primary) hypertension: Secondary | ICD-10-CM | POA: Insufficient documentation

## 2017-08-03 DIAGNOSIS — R1084 Generalized abdominal pain: Secondary | ICD-10-CM | POA: Diagnosis present

## 2017-08-03 DIAGNOSIS — Z79899 Other long term (current) drug therapy: Secondary | ICD-10-CM | POA: Insufficient documentation

## 2017-08-03 LAB — COMPREHENSIVE METABOLIC PANEL
ALBUMIN: 4.1 g/dL (ref 3.5–5.0)
ALK PHOS: 48 U/L (ref 38–126)
ALT: 18 U/L (ref 14–54)
AST: 19 U/L (ref 15–41)
Anion gap: 9 (ref 5–15)
BUN: 14 mg/dL (ref 6–20)
CALCIUM: 9.7 mg/dL (ref 8.9–10.3)
CHLORIDE: 98 mmol/L — AB (ref 101–111)
CO2: 32 mmol/L (ref 22–32)
CREATININE: 1.11 mg/dL — AB (ref 0.44–1.00)
GFR calc Af Amer: 54 mL/min — ABNORMAL LOW (ref >60)
GFR calc non Af Amer: 47 mL/min — ABNORMAL LOW (ref >60)
GLUCOSE: 114 mg/dL — AB (ref 65–99)
Potassium: 3.7 mmol/L (ref 3.5–5.1)
SODIUM: 139 mmol/L (ref 135–145)
Total Bilirubin: 0.8 mg/dL (ref 0.3–1.2)
Total Protein: 7.2 g/dL (ref 6.5–8.1)

## 2017-08-03 LAB — CBC WITH DIFFERENTIAL/PLATELET
BASOS PCT: 0 %
Basophils Absolute: 0 10*3/uL (ref 0.0–0.1)
EOS ABS: 0 10*3/uL (ref 0.0–0.7)
Eosinophils Relative: 0 %
HCT: 38.4 % (ref 36.0–46.0)
Hemoglobin: 12.3 g/dL (ref 12.0–15.0)
LYMPHS ABS: 1.8 10*3/uL (ref 0.7–4.0)
Lymphocytes Relative: 15 %
MCH: 31.1 pg (ref 26.0–34.0)
MCHC: 32 g/dL (ref 30.0–36.0)
MCV: 97.2 fL (ref 78.0–100.0)
MONO ABS: 0.9 10*3/uL (ref 0.1–1.0)
MONOS PCT: 7 %
Neutro Abs: 9.7 10*3/uL — ABNORMAL HIGH (ref 1.7–7.7)
Neutrophils Relative %: 78 %
Platelets: 250 10*3/uL (ref 150–400)
RBC: 3.95 MIL/uL (ref 3.87–5.11)
RDW: 15.3 % (ref 11.5–15.5)
WBC: 12.4 10*3/uL — ABNORMAL HIGH (ref 4.0–10.5)

## 2017-08-03 LAB — LIPASE, BLOOD: LIPASE: 19 U/L (ref 11–51)

## 2017-08-03 MED ORDER — CIPROFLOXACIN HCL 500 MG PO TABS: 500.0000 mg | ORAL_TABLET | Freq: Three times a day (TID) | ORAL | 0 refills | Status: DC

## 2017-08-03 MED ORDER — CIPROFLOXACIN IN D5W 400 MG/200ML IV SOLN
400.0000 mg | Freq: Once | INTRAVENOUS | Status: AC
  Administered 2017-08-03: 400 mg via INTRAVENOUS
  Filled 2017-08-03: qty 200

## 2017-08-03 MED ORDER — ONDANSETRON HCL 4 MG/2ML IJ SOLN
4.0000 mg | Freq: Once | INTRAMUSCULAR | Status: AC
  Administered 2017-08-03: 4 mg via INTRAVENOUS
  Filled 2017-08-03: qty 2

## 2017-08-03 MED ORDER — SODIUM CHLORIDE 0.9 % IV SOLN
INTRAVENOUS | Status: DC
  Administered 2017-08-03: 11:00:00 via INTRAVENOUS

## 2017-08-03 MED ORDER — IOPAMIDOL (ISOVUE-300) INJECTION 61%
100.0000 mL | Freq: Once | INTRAVENOUS | Status: AC | PRN
  Administered 2017-08-03: 100 mL via INTRAVENOUS

## 2017-08-03 MED ORDER — METRONIDAZOLE 500 MG PO TABS: 500.0000 mg | ORAL_TABLET | Freq: Two times a day (BID) | ORAL | 0 refills | Status: DC

## 2017-08-03 MED ORDER — METRONIDAZOLE IN NACL 5-0.79 MG/ML-% IV SOLN
500.0000 mg | Freq: Once | INTRAVENOUS | Status: AC
  Administered 2017-08-03: 500 mg via INTRAVENOUS
  Filled 2017-08-03: qty 100

## 2017-08-03 NOTE — ED Notes (Signed)
Patient transported to CT 

## 2017-08-03 NOTE — ED Provider Notes (Signed)
[facility] EMERGENCY DEPARTMENT Provider Note   CSN: [id number] Arrival date & time: 08/03/17  0850     History   Chief Complaint Chief Complaint  Patient presents with  . Abdominal Pain    HPI Monique Wagner is a 76 y.o. female.  Patient with complaint of bilateral lower quadrant abdominal pain since Wednesday.  But got worse yesterday.  Associated with persistent nausea and rare vomiting.  No diarrhea.  No fevers.  Patient states he reminds of her past case of diverticulitis.      Past Medical History:  Diagnosis Date  . Arthritis   . Diverticulitis   . HTN (hypertension)   . Hyperlipidemia     Patient Active Problem List   Diagnosis Date Noted  . Pain in the chest   . Chest pain 09/03/2014  . HTN (hypertension) 09/03/2014  . Hyperlipidemia 09/03/2014  . Pain in joint, shoulder region 07/10/2012  . 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  . Osteoarthrosis, unspecified whether generalized or localized, lower leg 04/21/2008  . KNEE PAIN 04/21/2008  . HIGH BLOOD PRESSURE 04/20/2008    Past Surgical History:  Procedure Laterality Date  . ABDOMINAL HYSTERECTOMY    . BREAST SURGERY      OB History    No data available       Home Medications    Prior to Admission medications   Medication Sig Start Date End Date Taking? Authorizing Provider  acidophilus (RISAQUAD) CAPS capsule Take 1 capsule by mouth daily.   Yes [provider]  aspirin 325 MG EC tablet Take 325 mg by mouth daily.   Yes [provider]  CALCIUM PO Take 1 tablet by mouth daily.   Yes [provider]  carvedilol (COREG CR) 20 MG 24 hr capsule Take 20 mg by mouth daily.   Yes [provider]  COD LIVER OIL PO Take 1 capsule by mouth daily.   Yes [provider]  folic acid (FOLVITE) 1 MG tablet Take 1 mg by mouth daily. 07/18/16  Yes [provider]  losartan-hydrochlorothiazide (HYZAAR) 50-12.5 MG per tablet Take 1 tablet by mouth  daily.   Yes [provider]  methotrexate (RHEUMATREX) 2.5 MG tablet Take 10 mg by mouth 2 (two) times a week. Take on Saturday and Sunday. 07/21/16  Yes [provider]  milk thistle 175 MG tablet Take 175 mg by mouth daily.   Yes [provider]  Multiple Vitamins-Minerals (MULTIVITAMIN PO) Take 1 tablet by mouth daily.    Yes [provider]  Omega-3 Fatty Acids (FISH OIL PO) Take 1 capsule by mouth daily.    Yes [provider]  OVER THE COUNTER MEDICATION Take 1 tablet by mouth daily. Slippery (vitamin)   Yes [provider]  predniSONE (DELTASONE) 10 MG tablet Take 0.5 tablets by mouth daily as needed. arthritis 08/04/14  Yes [provider]  sodium chloride (MURO 128) 5 % ophthalmic solution Place 1 drop into both eyes 3 (three) times daily. 07/17/16  Yes [provider]  triamterene-hydrochlorothiazide (MAXZIDE-25) 37.5-25 MG tablet Take 1 tablet by mouth daily. 06/17/16  Yes [provider]  ciprofloxacin (CIPRO) 500 MG tablet Take 1 tablet (500 mg total) by mouth 3 (three) times daily. 08/03/17   Vanetta Mulders, MD  metroNIDAZOLE (FLAGYL) 500 MG tablet Take 1 tablet (500 mg total) by mouth 2 (two) times daily. 08/03/17   Vanetta Mulders, MD    Family History Family History  Problem Relation Age of  Onset  . Arthritis Unknown   . Cancer Unknown   . Diabetes Unknown   . Heart disease Other     Social History Social History  Substance Use Topics  . Smoking status: Never Smoker  . Smokeless tobacco: Never Used  . Alcohol use No     Allergies   Patient has no known allergies.   Review of Systems Review of Systems  Constitutional: Negative for fever.  HENT: Negative for congestion.   Eyes: Negative for pain.  Respiratory: Negative for shortness of breath.   Cardiovascular: Negative for chest pain.  Gastrointestinal: Positive for abdominal pain, nausea and vomiting. Negative for diarrhea.    Genitourinary: Negative for dysuria.  Musculoskeletal: Negative for back pain.  Skin: Negative for rash.  Neurological: Negative for headaches.  Hematological: Does not bruise/bleed easily.  Psychiatric/Behavioral: Negative for confusion.     Physical Exam Updated Vital Signs BP (!) 117/59   Pulse 68   Temp 99.1 F (37.3 C)   Resp 16   Ht 1.6 m (5\' 3" )   Wt 79.8 kg (176 lb)   SpO2 97%   BMI 31.18 kg/m   Physical Exam  Constitutional: She is oriented to person, place, and time. She appears well-developed and well-nourished. No distress.  HENT:  Head: Normocephalic and atraumatic.  Mouth/Throat: Oropharynx is clear and moist.  Eyes: Pupils are equal, round, and reactive to light. Conjunctivae and EOM are normal.  Neck: Normal range of motion. Neck supple.  Cardiovascular: Normal rate and regular rhythm.   Pulmonary/Chest: Effort normal and breath sounds normal.  Abdominal: Soft. Bowel sounds are normal. There is tenderness.  Mild tenderness to palpation lower quadrants.  No guarding.  Musculoskeletal: Normal range of motion. She exhibits no edema.  Neurological: She is alert and oriented to person, place, and time. No cranial nerve deficit or sensory deficit. She exhibits normal muscle tone. Coordination normal.  Skin: Skin is warm.  Nursing note and vitals reviewed.    ED Treatments / Results  Labs (all labs ordered are listed, but only abnormal results are displayed) Labs Reviewed  COMPREHENSIVE METABOLIC PANEL - Abnormal; Notable for the following:       Result Value   Chloride 98 (*)    Glucose, Bld 114 (*)    Creatinine, Ser 1.11 (*)    GFR calc non Af Amer 47 (*)    GFR calc Af Amer 54 (*)    All other components within normal limits  CBC WITH DIFFERENTIAL/PLATELET - Abnormal; Notable for the following:    WBC 12.4 (*)    Neutro Abs 9.7 (*)    All other components within normal limits  LIPASE, BLOOD    EKG  EKG Interpretation None        Radiology Ct Abdomen Pelvis W Contrast  Result Date: 08/03/2017 CLINICAL DATA:  Abdominal pain, N/V/D x 4 days. Hx of HTN,diverticulitis,hysterectomy,breast surgery EXAM: CT ABDOMEN AND PELVIS WITH CONTRAST TECHNIQUE: Multidetector CT imaging of the abdomen and pelvis was performed using the standard protocol following bolus administration of intravenous contrast. CONTRAST:  ISOVUE-300 IOPAMIDOL (ISOVUE-300) INJECTION 61% COMPARISON:  02/26/2017 FINDINGS: Lower chest: No acute abnormality. Hepatobiliary: Stable liver lesions. 4.9 x 2.3 cm cystic lesion, segment 7. Hemangiomas in segment 3 and 2 in segment 6. Probable small cyst in segment 3. No new liver abnormalities. Normal gallbladder. No bile duct dilation. Pancreas: Unremarkable. No pancreatic ductal dilatation or surrounding inflammatory changes. Spleen: Normal in size without focal abnormality. Adrenals/Urinary Tract: No adrenal  masses. 11 mm upper pole left renal cyst, stable. Subcentimeter low-density lesion along the lateral cortical margin of the left kidney, also likely a cyst and also stable. No other renal masses, no stones and no hydronephrosis. Normal ureters. Bladder is unremarkable. Stomach/Bowel: Mild inflammatory changes noted along the mid sigmoid colon where there are multiple diverticula. Inflammation is new since the prior CT. There is no extraluminal air there are no fluid collection to suggest an abscess. Multiple other left colon diverticular noted with no other inflammatory changes. Stomach and small bowel are unremarkable. Appendix not visualized. Vascular/Lymphatic: No significant vascular findings are present. No enlarged abdominal or pelvic lymph nodes. Reproductive: Status post hysterectomy. No adnexal masses. Other: No abdominal wall hernia or abnormality. No abdominopelvic ascites. Musculoskeletal: No fracture or acute finding. No osteoblastic or osteolytic lesions. IMPRESSION: 1. Mild uncomplicated sigmoid  diverticulitis. No extraluminal air. No abscess. 2. No other acute abnormalities within the abdomen or pelvis. 3. Stable liver lesions consistent with a combination of cysts and hemangiomas. Electronically Signed   By: Amie Portlandavid  Ormond M.D.   On: 08/03/2017 11:48    Procedures Procedures (including critical care time)  Medications Ordered in ED Medications  0.9 %  sodium chloride infusion ( Intravenous New Bag/Given 08/03/17 1109)  ciprofloxacin (CIPRO) IVPB 400 mg (400 mg Intravenous New Bag/Given 08/03/17 1330)  metroNIDAZOLE (FLAGYL) IVPB 500 mg (500 mg Intravenous New Bag/Given 08/03/17 1323)  ondansetron (ZOFRAN) injection 4 mg (4 mg Intravenous Given 08/03/17 1109)  iopamidol (ISOVUE-300) 61 % injection 100 mL (100 mLs Intravenous Contrast Given 08/03/17 1117)     Initial Impression / Assessment and Plan / ED Course  I have reviewed the triage vital signs and the nursing notes.  Pertinent labs & imaging results that were available during my care of the patient were reviewed by me and considered in my medical decision making (see chart for details).    Workup consistent with diverticulitis without any complicating factors.  Patient treated here with IV Flagyl and Cipro.  Will be continued on them orally for the next 7 days.  Will follow up with her primary care doctor.  Bland diet recommended for the next 2 days.  Patient will return if not improved.   Final Clinical Impressions(s) / ED Diagnoses   Final diagnoses:  Diverticulitis    New Prescriptions New Prescriptions   CIPROFLOXACIN (CIPRO) 500 MG TABLET    Take 1 tablet (500 mg total) by mouth 3 (three) times daily.   METRONIDAZOLE (FLAGYL) 500 MG TABLET    Take 1 tablet (500 mg total) by mouth 2 (two) times daily.     Vanetta MuldersZackowski, Granvel Proudfoot, MD 08/03/17 [phone]

## 2017-08-03 NOTE — ED Triage Notes (Signed)
Pt c/o abd pain n/v/d since Wednesday.

## 2017-08-03 NOTE — Discharge Instructions (Signed)
Take the antibiotics as directed for the next 7 days.  Would expect improvement over the next 4 days.  Return for any new or worse symptoms.  Make an appointment to follow-up with your doctor.  If not improving in 4 days definitely need to follow-up.

## 2017-08-05 ENCOUNTER — Encounter (HOSPITAL_COMMUNITY): Payer: Self-pay

## 2017-08-05 ENCOUNTER — Emergency Department (HOSPITAL_COMMUNITY)
Admission: EM | Admit: 2017-08-05 | Discharge: 2017-08-06 | Disposition: A | Discharge status: Home / Self Care | Payer: Medicare Other | Attending: Emergency Medicine | Admitting: Emergency Medicine

## 2017-08-05 ENCOUNTER — Other Ambulatory Visit: Payer: Self-pay

## 2017-08-05 DIAGNOSIS — Z79899 Other long term (current) drug therapy: Secondary | ICD-10-CM | POA: Diagnosis not present

## 2017-08-05 DIAGNOSIS — K5732 Diverticulitis of large intestine without perforation or abscess without bleeding: Secondary | ICD-10-CM | POA: Insufficient documentation

## 2017-08-05 DIAGNOSIS — R109 Unspecified abdominal pain: Secondary | ICD-10-CM | POA: Diagnosis present

## 2017-08-05 DIAGNOSIS — I10 Essential (primary) hypertension: Secondary | ICD-10-CM | POA: Diagnosis not present

## 2017-08-05 LAB — COMPREHENSIVE METABOLIC PANEL
ALT: 19 U/L (ref 14–54)
AST: 23 U/L (ref 15–41)
Albumin: 3.6 g/dL (ref 3.5–5.0)
Alkaline Phosphatase: 48 U/L (ref 38–126)
Anion gap: 12 (ref 5–15)
BUN: 12 mg/dL (ref 6–20)
CHLORIDE: 95 mmol/L — AB (ref 101–111)
CO2: 26 mmol/L (ref 22–32)
CREATININE: 1.35 mg/dL — AB (ref 0.44–1.00)
Calcium: 9.9 mg/dL (ref 8.9–10.3)
GFR, EST AFRICAN AMERICAN: 43 mL/min — AB (ref >60)
GFR, EST NON AFRICAN AMERICAN: 37 mL/min — AB (ref >60)
Glucose, Bld: 122 mg/dL — ABNORMAL HIGH (ref 65–99)
POTASSIUM: 3.4 mmol/L — AB (ref 3.5–5.1)
Sodium: 133 mmol/L — ABNORMAL LOW (ref 135–145)
TOTAL PROTEIN: 6.8 g/dL (ref 6.5–8.1)
Total Bilirubin: 0.8 mg/dL (ref 0.3–1.2)

## 2017-08-05 LAB — URINALYSIS, ROUTINE W REFLEX MICROSCOPIC
Bilirubin Urine: NEGATIVE
GLUCOSE, UA: NEGATIVE mg/dL
HGB URINE DIPSTICK: NEGATIVE
Ketones, ur: 5 mg/dL — AB
NITRITE: NEGATIVE
PROTEIN: NEGATIVE mg/dL
SPECIFIC GRAVITY, URINE: 1.018 (ref 1.005–1.030)
pH: 6 (ref 5.0–8.0)

## 2017-08-05 LAB — CBC
HEMATOCRIT: 37.2 % (ref 36.0–46.0)
Hemoglobin: 12.1 g/dL (ref 12.0–15.0)
MCH: 31 pg (ref 26.0–34.0)
MCHC: 32.5 g/dL (ref 30.0–36.0)
MCV: 95.4 fL (ref 78.0–100.0)
PLATELETS: 240 10*3/uL (ref 150–400)
RBC: 3.9 MIL/uL (ref 3.87–5.11)
RDW: 15.2 % (ref 11.5–15.5)
WBC: 11.6 10*3/uL — AB (ref 4.0–10.5)

## 2017-08-05 LAB — LIPASE, BLOOD: LIPASE: 17 U/L (ref 11–51)

## 2017-08-05 NOTE — ED Triage Notes (Signed)
Pt was seen at [facility] last week and dx with diverticulitis. Pt has had persistent abd pain. Seen at PCP today and sent for further follow up of rebound tenderness. Pt reports nausea, one episode of vomiting but denies any blood in her stool.

## 2017-08-06 MED ORDER — OXYCODONE-ACETAMINOPHEN 5-325 MG PO TABS
1.0000 | ORAL_TABLET | Freq: Once | ORAL | Status: AC
  Administered 2017-08-06: 1 via ORAL
  Filled 2017-08-06: qty 1

## 2017-08-06 MED ORDER — DOCUSATE SODIUM 100 MG PO CAPS: 100.0000 mg | ORAL_CAPSULE | Freq: Two times a day (BID) | ORAL | 0 refills | Status: DC

## 2017-08-06 MED ORDER — ONDANSETRON 4 MG PO TBDP
4.0000 mg | ORAL_TABLET | Freq: Once | ORAL | Status: AC
Start: 2017-08-06 — End: 2017-08-06
  Administered 2017-08-06: 4 mg via ORAL
  Filled 2017-08-06: qty 1

## 2017-08-06 MED ORDER — ONDANSETRON 4 MG PO TBDP: 4.0000 mg | ORAL_TABLET | Freq: Three times a day (TID) | ORAL | 0 refills | Status: DC | PRN

## 2017-08-06 MED ORDER — METRONIDAZOLE 500 MG PO TABS
500.0000 mg | ORAL_TABLET | Freq: Once | ORAL | Status: AC
  Administered 2017-08-06: 500 mg via ORAL
  Filled 2017-08-06: qty 1

## 2017-08-06 MED ORDER — CIPROFLOXACIN HCL 500 MG PO TABS
500.0000 mg | ORAL_TABLET | Freq: Once | ORAL | Status: AC
  Administered 2017-08-06: 500 mg via ORAL
  Filled 2017-08-06: qty 1

## 2017-08-06 MED ORDER — METRONIDAZOLE 500 MG PO TABS: 500.0000 mg | ORAL_TABLET | Freq: Three times a day (TID) | ORAL | 0 refills | Status: DC

## 2017-08-06 MED ORDER — OXYCODONE-ACETAMINOPHEN 5-325 MG PO TABS: 1.0000 | ORAL_TABLET | Freq: Four times a day (QID) | ORAL | 0 refills | Status: DC | PRN

## 2017-08-06 NOTE — ED Notes (Signed)
ED Provider at bedside. Ward 

## 2017-08-06 NOTE — Discharge Instructions (Signed)
I recommend changing your Cipro to one tablet twice a day for 10 days total and your Flagyl to one tablet three times a day for 10 days total.

## 2017-08-06 NOTE — ED Provider Notes (Signed)
TIME SEEN: 12:02 AM  CHIEF COMPLAINT: Diverticulitis  HPI: Patient is a 76 year old female with history of hypertension, hyperlipidemia, diverticulitis who presents to the emergency department with persistent abdominal pain for the past several days.  Was seen at [facility] emergency department on November 3 and had a CT scan which showed mild uncomplicated sigmoid diverticulitis.  Was discharged on Cipro 3 times a day and Flagyl twice a day for 1 week.  She followed up with her PCP Dr. Loleta Chance today who sent her to the emergency department for repeat blood work and possible admission as she was continued to have pain.  She denies to me that her pain is any worse but feels like it is not getting better.  No fever.  Has had some nausea and one episode of vomiting.  Has not had a bowel movement in 2 days but states she has not been eating much because of instructions of food restrictions from any pain.  She states she has a good appetite.  She is passing gas.  She has been taking her antibiotics as prescribed.  She was not provided pain medication or nausea medicine by the ED provider or her primary care doctor.  ROS: See HPI Constitutional: no fever  Eyes: no drainage  ENT: no runny nose   Cardiovascular:  no chest pain  Resp: no SOB  GI:  vomiting GU: no dysuria Integumentary: no rash  Allergy: no hives  Musculoskeletal: no leg swelling  Neurological: no slurred speech ROS otherwise negative  PAST MEDICAL HISTORY/PAST SURGICAL HISTORY:  Past Medical History:  Diagnosis Date  . Arthritis   . Diverticulitis   . HTN (hypertension)   . Hyperlipidemia     MEDICATIONS:  Prior to Admission medications   Medication Sig Start Date End Date Taking? Authorizing Provider  acidophilus (RISAQUAD) CAPS capsule Take 1 capsule by mouth daily.    [provider]  aspirin 325 MG EC tablet Take 325 mg by mouth daily.    [provider]  CALCIUM PO Take 1 tablet by mouth daily.     [provider]  carvedilol (COREG CR) 20 MG 24 hr capsule Take 20 mg by mouth daily.    [provider]  ciprofloxacin (CIPRO) 500 MG tablet Take 1 tablet (500 mg total) by mouth 3 (three) times daily. 08/03/17   Vanetta Mulders, MD  COD LIVER OIL PO Take 1 capsule by mouth daily.    [provider]  folic acid (FOLVITE) 1 MG tablet Take 1 mg by mouth daily. 07/18/16   [provider]  losartan-hydrochlorothiazide (HYZAAR) 50-12.5 MG per tablet Take 1 tablet by mouth daily.    [provider]  methotrexate (RHEUMATREX) 2.5 MG tablet Take 10 mg by mouth 2 (two) times a week. Take on Saturday and Sunday. 07/21/16   [provider]  metroNIDAZOLE (FLAGYL) 500 MG tablet Take 1 tablet (500 mg total) by mouth 2 (two) times daily. 08/03/17   Vanetta Mulders, MD  milk thistle 175 MG tablet Take 175 mg by mouth daily.    [provider]  Multiple Vitamins-Minerals (MULTIVITAMIN PO) Take 1 tablet by mouth daily.     [provider]  Omega-3 Fatty Acids (FISH OIL PO) Take 1 capsule by mouth daily.     [provider]  OVER THE COUNTER MEDICATION Take 1 tablet by mouth daily. Slippery (vitamin)    [provider]  predniSONE (DELTASONE) 10 MG tablet Take 0.5 tablets by mouth daily as  needed. arthritis 08/04/14   [provider]  sodium chloride (MURO 128) 5 % ophthalmic solution Place 1 drop into both eyes 3 (three) times daily. 07/17/16   [provider]  triamterene-hydrochlorothiazide (MAXZIDE-25) 37.5-25 MG tablet Take 1 tablet by mouth daily. 06/17/16   [provider]    ALLERGIES:  No Known Allergies  SOCIAL HISTORY:  Social History   Tobacco Use  . Smoking status: Never Smoker  . Smokeless tobacco: Never Used  Substance Use Topics  . Alcohol use: No    FAMILY HISTORY: Family History  Problem Relation Age of Onset  . Arthritis Unknown   . Cancer Unknown   . Diabetes  Unknown   . Heart disease Other     EXAM: BP 133/63   Pulse 75   Temp 98.5 F (36.9 C) (Oral)   Resp 18   SpO2 98%  CONSTITUTIONAL: Alert and oriented and responds appropriately to questions. Well-appearing; well-nourished, elderly but appears younger than stated age, afebrile, smiling and laughing HEAD: Normocephalic EYES: Conjunctivae clear, pupils appear equal, EOMI ENT: normal nose; moist mucous membranes NECK: Supple, no meningismus, no nuchal rigidity, no LAD  CARD: RRR; S1 and S2 appreciated; no murmurs, no clicks, no rubs, no gallops RESP: Normal chest excursion without splinting or tachypnea; breath sounds clear and equal bilaterally; no wheezes, no rhonchi, no rales, no hypoxia or respiratory distress, speaking full sentences ABD/GI: Normal bowel sounds; non-distended; soft, very minimally tender throughout the lower abdomen, no rebound, no guarding, no peritoneal signs, no hepatosplenomegaly BACK:  The back appears normal and is non-tender to palpation, there is no CVA tenderness EXT: Normal ROM in all joints; non-tender to palpation; no edema; normal capillary refill; no cyanosis, no calf tenderness or swelling    SKIN: Normal color for age and race; warm; no rash NEURO: Moves all extremities equally PSYCH: The patient's mood and manner are appropriate. Grooming and personal hygiene are appropriate.  MEDICAL DECISION MAKING: Patient here with abdominal pain from diverticulitis.  Her abdominal exam is very benign.  She reports taking Tylenol at home intermittently and this does help her pain.  She has had one episode of vomiting.  No bloody stools or melena.  No peritoneal signs on my examination.  She is extremely well-appearing here.  I have offered her admission but we have also discussed the possibility of going home with pain medication and she would prefer discharge.  Will discharge with prescription of Percocet and Zofran.  I will also have her change her Cipro to twice  daily and Flagyl to 3 times daily and extend this for a total of 10 days.  She will follow-up with her primary care provider.  We discussed at length return precautions.  Discussed fluid restriction suggesting away from corn and foods with seeds but discussed with her otherwise she can eat what she wants.  Patient is happy with this plan.  She will be taken home by her daughter tonight.   At this time, I do not feel there is any life-threatening condition present. I have reviewed and discussed all results (EKG, imaging, lab, urine as appropriate) and exam findings with patient/family. I have reviewed nursing notes and appropriate previous records.  I feel the patient is safe to be discharged home without further emergent workup and can continue workup as an outpatient as needed. Discussed usual and customary return precautions. Patient/family verbalize understanding and are comfortable with this plan.  Outpatient follow-up has been provided if needed. All questions have  been answered.      Ward, Layla MawKristen N, DO 08/06/17 [phone]

## 2017-08-28 ENCOUNTER — Inpatient Hospital Stay (HOSPITAL_COMMUNITY)
Admission: EM | Admit: 2017-08-28 | Discharge: 2017-08-31 | DRG: 392 | Disposition: A | Discharge status: Home / Self Care | Payer: Medicare Other | Attending: Internal Medicine | Admitting: Internal Medicine

## 2017-08-28 ENCOUNTER — Emergency Department (HOSPITAL_COMMUNITY): Payer: Medicare Other

## 2017-08-28 ENCOUNTER — Other Ambulatory Visit: Payer: Self-pay

## 2017-08-28 ENCOUNTER — Encounter (HOSPITAL_COMMUNITY): Payer: Self-pay | Admitting: *Deleted

## 2017-08-28 DIAGNOSIS — M1990 Unspecified osteoarthritis, unspecified site: Secondary | ICD-10-CM | POA: Diagnosis present

## 2017-08-28 DIAGNOSIS — I2510 Atherosclerotic heart disease of native coronary artery without angina pectoris: Secondary | ICD-10-CM | POA: Diagnosis present

## 2017-08-28 DIAGNOSIS — K5732 Diverticulitis of large intestine without perforation or abscess without bleeding: Secondary | ICD-10-CM | POA: Diagnosis present

## 2017-08-28 DIAGNOSIS — I10 Essential (primary) hypertension: Secondary | ICD-10-CM | POA: Diagnosis present

## 2017-08-28 DIAGNOSIS — I129 Hypertensive chronic kidney disease with stage 1 through stage 4 chronic kidney disease, or unspecified chronic kidney disease: Secondary | ICD-10-CM | POA: Diagnosis present

## 2017-08-28 DIAGNOSIS — D649 Anemia, unspecified: Secondary | ICD-10-CM | POA: Diagnosis present

## 2017-08-28 DIAGNOSIS — R1030 Lower abdominal pain, unspecified: Secondary | ICD-10-CM | POA: Diagnosis not present

## 2017-08-28 DIAGNOSIS — K5792 Diverticulitis of intestine, part unspecified, without perforation or abscess without bleeding: Secondary | ICD-10-CM | POA: Diagnosis present

## 2017-08-28 DIAGNOSIS — Z79899 Other long term (current) drug therapy: Secondary | ICD-10-CM | POA: Diagnosis not present

## 2017-08-28 DIAGNOSIS — N183 Chronic kidney disease, stage 3 (moderate): Secondary | ICD-10-CM | POA: Diagnosis present

## 2017-08-28 DIAGNOSIS — E785 Hyperlipidemia, unspecified: Secondary | ICD-10-CM | POA: Diagnosis present

## 2017-08-28 DIAGNOSIS — M1710 Unilateral primary osteoarthritis, unspecified knee: Secondary | ICD-10-CM | POA: Diagnosis present

## 2017-08-28 DIAGNOSIS — E782 Mixed hyperlipidemia: Secondary | ICD-10-CM | POA: Diagnosis present

## 2017-08-28 LAB — URINALYSIS, ROUTINE W REFLEX MICROSCOPIC
Bacteria, UA: NONE SEEN
Bilirubin Urine: NEGATIVE
Glucose, UA: NEGATIVE mg/dL
Ketones, ur: NEGATIVE mg/dL
Leukocytes, UA: NEGATIVE
Nitrite: NEGATIVE
PROTEIN: NEGATIVE mg/dL
Specific Gravity, Urine: 1.008 (ref 1.005–1.030)
pH: 6 (ref 5.0–8.0)

## 2017-08-28 LAB — LIPASE, BLOOD: LIPASE: 19 U/L (ref 11–51)

## 2017-08-28 LAB — CBC
HCT: 34.3 % — ABNORMAL LOW (ref 36.0–46.0)
HEMOGLOBIN: 10.8 g/dL — AB (ref 12.0–15.0)
MCH: 30.2 pg (ref 26.0–34.0)
MCHC: 31.5 g/dL (ref 30.0–36.0)
MCV: 95.8 fL (ref 78.0–100.0)
Platelets: 285 10*3/uL (ref 150–400)
RBC: 3.58 MIL/uL — AB (ref 3.87–5.11)
RDW: 14.6 % (ref 11.5–15.5)
WBC: 7.5 10*3/uL (ref 4.0–10.5)

## 2017-08-28 LAB — COMPREHENSIVE METABOLIC PANEL
ALBUMIN: 3.6 g/dL (ref 3.5–5.0)
ALT: 30 U/L (ref 14–54)
ANION GAP: 10 (ref 5–15)
AST: 39 U/L (ref 15–41)
Alkaline Phosphatase: 90 U/L (ref 38–126)
BUN: 18 mg/dL (ref 6–20)
CHLORIDE: 97 mmol/L — AB (ref 101–111)
CO2: 30 mmol/L (ref 22–32)
CREATININE: 1.3 mg/dL — AB (ref 0.44–1.00)
Calcium: 10.4 mg/dL — ABNORMAL HIGH (ref 8.9–10.3)
GFR calc non Af Amer: 39 mL/min — ABNORMAL LOW (ref >60)
GFR, EST AFRICAN AMERICAN: 45 mL/min — AB (ref >60)
GLUCOSE: 127 mg/dL — AB (ref 65–99)
Potassium: 3.6 mmol/L (ref 3.5–5.1)
SODIUM: 137 mmol/L (ref 135–145)
Total Bilirubin: 0.8 mg/dL (ref 0.3–1.2)
Total Protein: 7.6 g/dL (ref 6.5–8.1)

## 2017-08-28 MED ORDER — CIPROFLOXACIN IN D5W 400 MG/200ML IV SOLN
400.0000 mg | Freq: Once | INTRAVENOUS | Status: AC
  Administered 2017-08-28: 400 mg via INTRAVENOUS
  Filled 2017-08-28: qty 200

## 2017-08-28 MED ORDER — METRONIDAZOLE IN NACL 5-0.79 MG/ML-% IV SOLN
500.0000 mg | Freq: Once | INTRAVENOUS | Status: AC
  Administered 2017-08-28: 500 mg via INTRAVENOUS
  Filled 2017-08-28: qty 100

## 2017-08-28 MED ORDER — PIPERACILLIN-TAZOBACTAM 3.375 G IVPB
3.3750 g | Freq: Once | INTRAVENOUS | Status: AC
  Administered 2017-08-29: 3.375 g via INTRAVENOUS
  Filled 2017-08-28: qty 50

## 2017-08-28 MED ORDER — ONDANSETRON HCL 4 MG/2ML IJ SOLN
4.0000 mg | Freq: Once | INTRAMUSCULAR | Status: AC
  Administered 2017-08-28: 4 mg via INTRAVENOUS
  Filled 2017-08-28: qty 2

## 2017-08-28 MED ORDER — SODIUM CHLORIDE 0.9 % IV SOLN
INTRAVENOUS | Status: DC
  Administered 2017-08-28: 21:00:00 via INTRAVENOUS

## 2017-08-28 MED ORDER — IOPAMIDOL (ISOVUE-300) INJECTION 61%
100.0000 mL | Freq: Once | INTRAVENOUS | Status: AC | PRN
  Administered 2017-08-28: 100 mL via INTRAVENOUS

## 2017-08-28 NOTE — ED Triage Notes (Signed)
Pt c/o abd pain, constipated since Friday, pt reports that her last "normal" bowel movement was last Friday, has had a small one today that was hard but continues to have abd pain. Recently treated for diverticulitis

## 2017-08-28 NOTE — ED Provider Notes (Signed)
[facility] EMERGENCY DEPARTMENT Provider Note   CSN: [id number] Arrival date & time: 08/28/17  1856     History   Chief Complaint Chief Complaint  Patient presents with  . Abdominal Pain    HPI Monique Wagner is a 76 y.o. female.  Patient with persistent abdominal pain since diagnosis of diverticulitis on November 3.  Patient seen by me CT confirmed started on Cipro and Flagyl for 7 days.  Also received an IV course of antibiotics in the emergency department.  Patient followed up again on November fifth because she was not feeling much better.  Her antibiotic course was continued for a total of 10 days.  Patient may be felt better for a day or 2 but then abdominal pain persisted.  Saw her primary care provider on Monday of this week and she was given additional pain medication.  Not restarted on antibiotics.  Patient states that she thinks her abdominal pain is due to constipation because she is having difficulty having bowel movements.  Patient denies any nausea or vomiting.  No fevers      Past Medical History:  Diagnosis Date  . Arthritis   . Diverticulitis   . HTN (hypertension)   . Hyperlipidemia     Patient Active Problem List   Diagnosis Date Noted  . Pain in the chest   . Chest pain 09/03/2014  . HTN (hypertension) 09/03/2014  . Hyperlipidemia 09/03/2014  . Pain in joint, shoulder region 07/10/2012  . 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  . Osteoarthrosis, unspecified whether generalized or localized, lower leg 04/21/2008  . KNEE PAIN 04/21/2008  . HIGH BLOOD PRESSURE 04/20/2008    Past Surgical History:  Procedure Laterality Date  . ABDOMINAL HYSTERECTOMY    . BREAST SURGERY      OB History    No data available       Home Medications    Prior to Admission medications   Medication Sig Start Date End Date Taking? Authorizing Provider  acidophilus (RISAQUAD) CAPS capsule Take 1 capsule by mouth daily.    [provider]  aspirin 325  MG EC tablet Take 325 mg by mouth daily.    [provider]  CALCIUM PO Take 1 tablet by mouth daily.    [provider]  carvedilol (COREG CR) 20 MG 24 hr capsule Take 20 mg by mouth daily.    [provider]  ciprofloxacin (CIPRO) 500 MG tablet Take 1 tablet (500 mg total) by mouth 3 (three) times daily. 08/03/17   Vanetta Mulders, MD  COD LIVER OIL PO Take 1 capsule by mouth daily.    [provider]  docusate sodium (COLACE) 100 MG capsule Take 1 capsule (100 mg total) every 12 (twelve) hours by mouth. 08/06/17   Ward, Layla Maw, DO  folic acid (FOLVITE) 1 MG tablet Take 1 mg by mouth daily. 07/18/16   [provider]  losartan-hydrochlorothiazide (HYZAAR) 50-12.5 MG per tablet Take 1 tablet by mouth daily.    [provider]  methotrexate (RHEUMATREX) 2.5 MG tablet Take 10 mg by mouth 2 (two) times a week. Take on Saturday and Sunday. 07/21/16   [provider]  metroNIDAZOLE (FLAGYL) 500 MG tablet Take 1 tablet (500 mg total) by mouth 2 (two) times daily. 08/03/17   Vanetta Mulders, MD  metroNIDAZOLE (FLAGYL) 500 MG tablet Take 1 tablet (500 mg total) 3 (three) times daily by mouth. 08/06/17   Ward, Layla Maw, DO  milk thistle 175 MG  tablet Take 175 mg by mouth daily.    [provider]  Multiple Vitamins-Minerals (MULTIVITAMIN PO) Take 1 tablet by mouth daily.     [provider]  Omega-3 Fatty Acids (FISH OIL PO) Take 1 capsule by mouth daily.     [provider]  ondansetron (ZOFRAN ODT) 4 MG disintegrating tablet Take 1 tablet (4 mg total) every 8 (eight) hours as needed by mouth for nausea or vomiting. 08/06/17   Ward, Layla Maw, DO  OVER THE COUNTER MEDICATION Take 1 tablet by mouth daily. Slippery (vitamin)    [provider]  oxyCODONE-acetaminophen (PERCOCET/ROXICET) 5-325 MG tablet Take 1 tablet every 6 (six) hours as needed by mouth. 08/06/17   Ward, Layla Maw, DO  predniSONE (DELTASONE)  10 MG tablet Take 0.5 tablets by mouth daily as needed. arthritis 08/04/14   [provider]  sodium chloride (MURO 128) 5 % ophthalmic solution Place 1 drop into both eyes 3 (three) times daily. 07/17/16   [provider]  triamterene-hydrochlorothiazide (MAXZIDE-25) 37.5-25 MG tablet Take 1 tablet by mouth daily. 06/17/16   [provider]    Family History Family History  Problem Relation Age of Onset  . Arthritis Unknown   . Cancer Unknown   . Diabetes Unknown   . Heart disease Other     Social History Social History   Tobacco Use  . Smoking status: Never Smoker  . Smokeless tobacco: Never Used  Substance Use Topics  . Alcohol use: No  . Drug use: No     Allergies   Patient has no known allergies.   Review of Systems Review of Systems  Constitutional: Negative for fever.  HENT: Positive for congestion.   Eyes: Negative for redness.  Respiratory: Negative for shortness of breath.   Cardiovascular: Negative for chest pain.  Gastrointestinal: Positive for abdominal pain and constipation. Negative for nausea and vomiting.  Genitourinary: Negative for dysuria.  Musculoskeletal: Positive for back pain.  Skin: Negative for rash.  Neurological: Negative for headaches.  Hematological: Does not bruise/bleed easily.  Psychiatric/Behavioral: Negative for confusion.     Physical Exam Updated Vital Signs BP 111/61   Pulse 75   Temp 99.1 F (37.3 C) (Oral)   Resp 20   Ht 1.619 m (5' 3.75")   Wt 79.8 kg (176 lb)   SpO2 97%   BMI 30.45 kg/m   Physical Exam  Constitutional: She is oriented to person, place, and time. She appears well-developed and well-nourished. No distress.  HENT:  Head: Normocephalic and atraumatic.  Eyes: EOM are normal. Pupils are equal, round, and reactive to light.  Neck: Normal range of motion. Neck supple.  Cardiovascular: Normal rate, regular rhythm and normal heart sounds.  Pulmonary/Chest: Effort normal and  breath sounds normal.  Abdominal: Soft. Bowel sounds are normal. There is tenderness.  Mild tenderness to palpation left lower quadrant no guarding  Musculoskeletal: Normal range of motion.  Neurological: She is alert and oriented to person, place, and time. No cranial nerve deficit. She exhibits normal muscle tone. Coordination normal.  Skin: Skin is warm.  Nursing note and vitals reviewed.    ED Treatments / Results  Labs (all labs ordered are listed, but only abnormal results are displayed) Labs Reviewed  COMPREHENSIVE METABOLIC PANEL - Abnormal; Notable for the following components:      Result Value   Chloride 97 (*)    Glucose, Bld 127 (*)    Creatinine, Ser 1.30 (*)    Calcium 10.4 (*)  GFR calc non Af Amer 39 (*)    GFR calc Af Amer 45 (*)    All other components within normal limits  CBC - Abnormal; Notable for the following components:   RBC 3.58 (*)    Hemoglobin 10.8 (*)    HCT 34.3 (*)    All other components within normal limits  URINALYSIS, ROUTINE W REFLEX MICROSCOPIC - Abnormal; Notable for the following components:   Hgb urine dipstick SMALL (*)    Squamous Epithelial / LPF 0-5 (*)    All other components within normal limits  LIPASE, BLOOD    EKG  EKG Interpretation None       Radiology Ct Abdomen Pelvis W Contrast  Result Date: 08/28/2017 CLINICAL DATA:  Right and left lower abdominal pain. Recent diverticulitis. EXAM: CT ABDOMEN AND PELVIS WITH CONTRAST TECHNIQUE: Multidetector CT imaging of the abdomen and pelvis was performed using the standard protocol following bolus administration of intravenous contrast. CONTRAST:  100mL ISOVUE-300 IOPAMIDOL (ISOVUE-300) INJECTION 61% COMPARISON:  CT 08/03/2017, 02/26/2017, 08/28/2016 FINDINGS: Lower chest: Hypoventilatory changes at the lung bases with atelectasis in the right middle and lower lobes. No pleural fluid. Scattered coronary artery calcifications. Hepatobiliary: Unchanged hepatic lesions from  prior exam. Subcapsular 5 cm hypodense lesion in segment 7 is unchanged. Peripherally enhancing 2.4 cm lesion in the left lobe consistent with hemangioma, unchanged. Smaller hypodense and enhancing lesions are also stable likely combination of cysts or hemangiomas. Gallbladder is nondistended, no calcified gallstone. No biliary dilatation. Pancreas: No ductal dilatation or inflammation. Spleen: Normal in size. Tiny subcapsular lesion superiorly is unchanged, too small to characterize, likely cyst or hemangioma. Adrenals/Urinary Tract: No adrenal nodule. Prominence of both renal pelvises, progressed from prior exam without frank hydronephrosis. No perinephric edema. Unchanged 11 mm cyst in the upper left kidney, with adjacent subcentimeter hypodensity laterally, too small to characterize. Symmetric excretion on delayed phase imaging. Urinary bladder partially distended, no bladder wall thickening. Slight irregular bladder morphology is unchanged. Stomach/Bowel: Moderate length segment colonic wall thickening and pericolonic soft tissue stranding involving the distal descending and sigmoid colon in the region of multiple colonic diverticulum. Degree of soft tissue stranding has increased from prior exam. No extraluminal air or abscess. Moderate colonic stool in the more proximal colon. No small bowel dilatation or inflammation. Appendix not visualized. Stomach is nondistended. Vascular/Lymphatic: Mild aorta bi-iliac atherosclerosis. No enlarged abdominal or pelvic lymph nodes. Reproductive: Post hysterectomy. Cystic changes in the left ovary which is increased from prior exam. The ovary is intimately related to the sigmoid colon in the region of inflammation. Right ovary not confidently visualized. Other: No free air. Edema tracks in the left pericolic gutter from sigmoid colonic inflammation. Minimal fat in both inguinal canals. Tiny fat containing umbilical hernia. Musculoskeletal: Hemi transitional lumbosacral  anatomy with degenerative change in the lumbar spine. There are no acute or suspicious osseous abnormalities. IMPRESSION: 1. Sigmoid colonic wall thickening and pericolonic inflammatory changes in the region of multiple diverticula, recurrent or progressed from prior exam. Findings are concerning for recurrent diverticulitis, or less likely colitis. Similar wall thickening was seen in this region on prior exams including earlier this month, 6 months prior and 1 year prior. Recommend direct visualization with colonoscopy to exclude possibility of underlying neoplastic lesion as cause of colonic wall thickening and intermittent inflammation. Chronic/recurrent diverticulitis could also have this appearance. 2. Increasing cystic changes in the left ovary, which is intimately related to sigmoid colon in the region of inflammation. Findings may be sympathetic/reactive, however  are nonspecific. Colo-ovarian fistulous changes can occur, however are exceedingly rare. Recommend follow-up after course of treatment. 3. Stable liver lesions likely combination of cysts and hemangiomas. 4. Prominent bilateral renal pelvises, without frank hydronephrosis or perinephric edema, likely extrarenal pelvis configuration. 5.  Aortic Atherosclerosis (ICD10-I70.0). Electronically Signed   By: Rubye OaksMelanie  Ehinger M.D.   On: 08/28/2017 22:26    Procedures Procedures (including critical care time)  Medications Ordered in ED Medications  0.9 %  sodium chloride infusion ( Intravenous New Bag/Given 08/28/17 2114)  ciprofloxacin (CIPRO) IVPB 400 mg (not administered)  metroNIDAZOLE (FLAGYL) IVPB 500 mg (not administered)  ondansetron (ZOFRAN) injection 4 mg (4 mg Intravenous Given 08/28/17 2114)  iopamidol (ISOVUE-300) 61 % injection 100 mL (100 mLs Intravenous Contrast Given 08/28/17 2148)     Initial Impression / Assessment and Plan / ED Course  I have reviewed the triage vital signs and the nursing notes.  Pertinent labs &  imaging results that were available during my care of the patient were reviewed by me and considered in my medical decision making (see chart for details).    Patient technically is outpatient failure of antibiotics for diverticulitis.  Patient was seen by me on November 3.  Given IV antibiotics while here in the emergency department and discharged on a 7-day course of Cipro and Flagyl.  Patient sought follow-up again on November 5.  Patient was cleared for home and antibiotics were continued for a total of 10 days.  So patient completed antibiotic somewhere around November 15.  Patient saw her primary care provider on Monday of this week with persistent complaint of abdominal pain and was given additional prescription for pain medicine.  Patient felt that her problem was constipation.  CT scan does show some some moderate colonic stool.  But most likely the problem is her still significant inflammation in the left lower quadrant area.  Most likely related to diverticulitis.  No complicating factors however also could be inflammatory changes for other reasons.  Patient states she has had a colonoscopy within the past year which had no significant findings.  [place] when she is not sure of the name.  Patient started here on Cipro and Flagyl contacted hospitalist for admission they agreed patient will be admitted and continue on IV antibiotics.  Patient nontoxic no acute distress.   Final Clinical Impressions(s) / ED Diagnoses   Final diagnoses:  Diverticulitis    ED Discharge Orders    None       Vanetta MuldersZackowski, Dayonna Selbe, MD 08/29/17 [phone]

## 2017-08-29 ENCOUNTER — Encounter (HOSPITAL_COMMUNITY): Payer: Self-pay | Admitting: Family Medicine

## 2017-08-29 ENCOUNTER — Other Ambulatory Visit: Payer: Self-pay

## 2017-08-29 LAB — BASIC METABOLIC PANEL
Anion gap: 9 (ref 5–15)
BUN: 15 mg/dL (ref 6–20)
CALCIUM: 9.2 mg/dL (ref 8.9–10.3)
CO2: 29 mmol/L (ref 22–32)
CREATININE: 1.42 mg/dL — AB (ref 0.44–1.00)
Chloride: 100 mmol/L — ABNORMAL LOW (ref 101–111)
GFR calc Af Amer: 40 mL/min — ABNORMAL LOW (ref >60)
GFR calc non Af Amer: 35 mL/min — ABNORMAL LOW (ref >60)
GLUCOSE: 110 mg/dL — AB (ref 65–99)
Potassium: 3.5 mmol/L (ref 3.5–5.1)
Sodium: 138 mmol/L (ref 135–145)

## 2017-08-29 LAB — CBC
HCT: 28.3 % — ABNORMAL LOW (ref 36.0–46.0)
Hemoglobin: 9.1 g/dL — ABNORMAL LOW (ref 12.0–15.0)
MCH: 31.1 pg (ref 26.0–34.0)
MCHC: 32.2 g/dL (ref 30.0–36.0)
MCV: 96.6 fL (ref 78.0–100.0)
Platelets: 227 10*3/uL (ref 150–400)
RBC: 2.93 MIL/uL — ABNORMAL LOW (ref 3.87–5.11)
RDW: 14.7 % (ref 11.5–15.5)
WBC: 6.6 10*3/uL (ref 4.0–10.5)

## 2017-08-29 MED ORDER — DOCUSATE SODIUM 100 MG PO CAPS
100.0000 mg | ORAL_CAPSULE | Freq: Once | ORAL | Status: AC
  Administered 2017-08-29: 100 mg via ORAL
  Filled 2017-08-29: qty 1

## 2017-08-29 MED ORDER — HYDROMORPHONE HCL 1 MG/ML IJ SOLN: 0.5000 mg | INTRAMUSCULAR | Status: DC | PRN

## 2017-08-29 MED ORDER — LOSARTAN POTASSIUM 50 MG PO TABS
50.0000 mg | ORAL_TABLET | Freq: Every day | ORAL | Status: DC
  Administered 2017-08-29 – 2017-08-31 (×3): 50 mg via ORAL
  Filled 2017-08-29 (×3): qty 1

## 2017-08-29 MED ORDER — LOSARTAN POTASSIUM-HCTZ 50-12.5 MG PO TABS: 1.0000 | ORAL_TABLET | Freq: Every day | ORAL | Status: DC

## 2017-08-29 MED ORDER — ENOXAPARIN SODIUM 40 MG/0.4ML ~~LOC~~ SOLN
40.0000 mg | SUBCUTANEOUS | Status: DC
  Administered 2017-08-29: 40 mg via SUBCUTANEOUS
  Filled 2017-08-29 (×2): qty 0.4

## 2017-08-29 MED ORDER — CARVEDILOL PHOSPHATE ER 10 MG PO CP24
20.0000 mg | ORAL_CAPSULE | Freq: Every day | ORAL | Status: DC
  Administered 2017-08-29 – 2017-08-31 (×3): 20 mg via ORAL
  Filled 2017-08-29: qty 2
  Filled 2017-08-29 (×2): qty 1
  Filled 2017-08-29 (×6): qty 2

## 2017-08-29 MED ORDER — ONDANSETRON HCL 4 MG/2ML IJ SOLN: 4.0000 mg | Freq: Four times a day (QID) | INTRAMUSCULAR | Status: DC | PRN

## 2017-08-29 MED ORDER — ACETAMINOPHEN 325 MG PO TABS: 650.0000 mg | ORAL_TABLET | Freq: Four times a day (QID) | ORAL | Status: DC | PRN

## 2017-08-29 MED ORDER — SODIUM CHLORIDE 0.9 % IV SOLN
INTRAVENOUS | Status: DC
  Administered 2017-08-29 – 2017-08-30 (×3): via INTRAVENOUS

## 2017-08-29 MED ORDER — METHOTREXATE 2.5 MG PO TABS
10.0000 mg | ORAL_TABLET | ORAL | Status: DC
Start: 2017-08-31 — End: 2017-08-31
  Filled 2017-08-29 (×2): qty 4

## 2017-08-29 MED ORDER — FOLIC ACID 1 MG PO TABS
1.0000 mg | ORAL_TABLET | Freq: Every day | ORAL | Status: DC
  Administered 2017-08-29 – 2017-08-31 (×3): 1 mg via ORAL
  Filled 2017-08-29 (×3): qty 1

## 2017-08-29 MED ORDER — ASPIRIN EC 325 MG PO TBEC
325.0000 mg | DELAYED_RELEASE_TABLET | Freq: Every day | ORAL | Status: DC
  Administered 2017-08-29 – 2017-08-30 (×2): 325 mg via ORAL
  Filled 2017-08-29 (×2): qty 1

## 2017-08-29 MED ORDER — ACETAMINOPHEN 650 MG RE SUPP: 650.0000 mg | Freq: Four times a day (QID) | RECTAL | Status: DC | PRN

## 2017-08-29 MED ORDER — ONDANSETRON HCL 4 MG PO TABS: 4.0000 mg | ORAL_TABLET | Freq: Four times a day (QID) | ORAL | Status: DC | PRN

## 2017-08-29 MED ORDER — PIPERACILLIN-TAZOBACTAM 3.375 G IVPB
3.3750 g | Freq: Three times a day (TID) | INTRAVENOUS | Status: DC
  Administered 2017-08-29 – 2017-08-30 (×5): 3.375 g via INTRAVENOUS
  Filled 2017-08-29 (×5): qty 50

## 2017-08-29 MED ORDER — HYDROCHLOROTHIAZIDE 12.5 MG PO CAPS
12.5000 mg | ORAL_CAPSULE | Freq: Every day | ORAL | Status: DC
  Administered 2017-08-29 – 2017-08-31 (×3): 12.5 mg via ORAL
  Filled 2017-08-29 (×3): qty 1

## 2017-08-29 NOTE — H&P (Signed)
[facility] History and Physical  Monique Wagner NGE:[id number] DOB: November 29, 1940 DOA: 08/28/2017  Referring physician:  PCP: Mirna Mires, MD   Chief Complaint: "I think I am constipated."    HPI: Monique Wagner is a 76 y.o. female with past medical history significant for hypertension hyperlipidemia and diverticulitis presents the emergency room with abdominal pain.  Patient has been seen in the emergency room twice for diverticulitis.  She was placed on Cipro and Flagyl.  Feels her symptoms have truly resolved.  Has been eating less over the last 6 weeks.  Has lost 6 pounds.  No fevers or chills.  No diarrhea.  Pain does not radiate.  Patient feels he did have a left bowel movements over the last 7 days.  Came to the emergency room for evaluation of constipation.  ED course: Found to have extensive diverticulitis and multiple abnormalities on CT abdomen pelvis with contrast.  Hospitalist consulted for admission.   Review of Systems:  As per HPI otherwise 10 point review of systems negative.    Past Medical History:  Diagnosis Date  . Arthritis   . Diverticulitis   . HTN (hypertension)   . Hyperlipidemia    Past Surgical History:  Procedure Laterality Date  . ABDOMINAL HYSTERECTOMY    . BREAST SURGERY     Social History:  reports that  has never smoked. she has never used smokeless tobacco. She reports that she does not drink alcohol or use drugs.  No Known Allergies  Family History  Problem Relation Age of Onset  . Arthritis Unknown   . Cancer Unknown   . Diabetes Unknown   . Heart disease Other      Prior to Admission medications   Medication Sig Start Date End Date Taking? Authorizing Provider  acidophilus (RISAQUAD) CAPS capsule Take 1 capsule by mouth daily.   Yes [provider]  aspirin 325 MG EC tablet Take 325 mg by mouth daily.   Yes [provider]  CALCIUM PO Take 1 tablet by mouth daily.   Yes [provider]  carvedilol  (COREG CR) 20 MG 24 hr capsule Take 20 mg by mouth daily.   Yes [provider]  COD LIVER OIL PO Take 1 capsule by mouth daily.   Yes [provider]  docusate sodium (COLACE) 100 MG capsule Take 1 capsule (100 mg total) every 12 (twelve) hours by mouth. 08/06/17  Yes Ward, Layla Maw, DO  folic acid (FOLVITE) 1 MG tablet Take 1 mg by mouth daily. 07/18/16  Yes [provider]  losartan-hydrochlorothiazide (HYZAAR) 50-12.5 MG per tablet Take 1 tablet by mouth daily.   Yes [provider]  methotrexate (RHEUMATREX) 2.5 MG tablet Take 10 mg by mouth 2 (two) times a week. Take on Saturday and Sunday. 07/21/16  Yes [provider]  milk thistle 175 MG tablet Take 175 mg by mouth daily.   Yes [provider]  Multiple Vitamins-Minerals (MULTIVITAMIN PO) Take 1 tablet by mouth daily.    Yes [provider]  Omega-3 Fatty Acids (FISH OIL PO) Take 1 capsule by mouth daily.    Yes [provider]  ondansetron (ZOFRAN ODT) 4 MG disintegrating tablet Take 1 tablet (4 mg total) every 8 (eight) hours as needed by mouth for nausea or vomiting. 08/06/17  Yes Ward, Kristen N, DO  OVER THE COUNTER MEDICATION Take 1 tablet by mouth daily. [place] (vitamin)   Yes [provider]  oxyCODONE-acetaminophen (PERCOCET/ROXICET) 5-325 MG  tablet Take 1 tablet every 6 (six) hours as needed by mouth. 08/06/17  Yes Ward, Layla MawKristen N, DO  predniSONE (DELTASONE) 10 MG tablet Take 0.5 tablets by mouth daily as needed. arthritis 08/04/14  Yes [provider]  triamterene-hydrochlorothiazide (MAXZIDE-25) 37.5-25 MG tablet Take 1 tablet by mouth daily. 06/17/16  Yes [provider]  ciprofloxacin (CIPRO) 500 MG tablet Take 1 tablet (500 mg total) by mouth 3 (three) times daily. Patient not taking: Reported on 08/28/2017 08/03/17   Vanetta MuldersZackowski, Scott, MD  metroNIDAZOLE (FLAGYL) 500 MG tablet Take 1 tablet (500 mg total) by mouth 2 (two) times  daily. Patient not taking: Reported on 08/28/2017 08/03/17   Vanetta MuldersZackowski, Scott, MD  metroNIDAZOLE (FLAGYL) 500 MG tablet Take 1 tablet (500 mg total) 3 (three) times daily by mouth. Patient not taking: Reported on 08/28/2017 08/06/17   Ward, Layla MawKristen N, DO   Physical Exam: Vitals:   08/28/17 1927 08/28/17 1928 08/28/17 2230 08/29/17 0039  BP:  133/66 111/61 (!) 115/58  Pulse:  81 75 78  Resp:  20 20 20   Temp:  99.1 F (37.3 C)  98.8 F (37.1 C)  TempSrc:  Oral  Oral  SpO2:  98% 97% 100%  Weight: 79.8 kg (176 lb)   80.5 kg (177 lb 6.4 oz)  Height: 5' 3.75" (1.619 m)   5' 3.75" (1.619 m)    Wt Readings from Last 3 Encounters:  08/29/17 80.5 kg (177 lb 6.4 oz)  08/03/17 79.8 kg (176 lb)  02/26/17 78.5 kg (173 lb)    General:  Appears calm and comfortable; a&Ox3 Eyes:  PERRL, EOMI, normal lids, iris ENT:  grossly normal hearing, lips & tongue Neck:  no LAD, masses or thyromegaly Cardiovascular:  RRR, no m/r/g. No LE edema.  Respiratory:  CTA bilaterally, no w/r/r. Normal respiratory effort. Abdomen:   tense, distended, BS+ Skin:  no rash or induration seen on limited exam Musculoskeletal:  grossly normal tone BUE/BLE Psychiatric:  grossly normal mood and affect, speech fluent and appropriate Neurologic:  CN 2-12 grossly intact, moves all extremities in coordinated fashion.          Labs on Admission:  Basic Metabolic Panel: Recent Labs  Lab 08/28/17 1955  NA 137  K 3.6  CL 97*  CO2 30  GLUCOSE 127*  BUN 18  CREATININE 1.30*  CALCIUM 10.4*   Liver Function Tests: Recent Labs  Lab 08/28/17 1955  AST 39  ALT 30  ALKPHOS 90  BILITOT 0.8  PROT 7.6  ALBUMIN 3.6   Recent Labs  Lab 08/28/17 1955  LIPASE 19   No results for input(s): AMMONIA in the last 168 hours. CBC: Recent Labs  Lab 08/28/17 1955  WBC 7.5  HGB 10.8*  HCT 34.3*  MCV 95.8  PLT 285   Cardiac Enzymes: No results for input(s): CKTOTAL, CKMB, CKMBINDEX, TROPONINI in the last 168  hours.  BNP (last 3 results) No results for input(s): BNP in the last 8760 hours.  ProBNP (last 3 results) No results for input(s): PROBNP in the last 8760 hours.   Serum creatinine: 1.3 mg/dL (H) 09/81/1911/28/18 [id number] Estimated creatinine clearance: 37.6 mL/min (A)  CBG: No results for input(s): GLUCAP in the last 168 hours.  Radiological Exams on Admission: Ct Abdomen Pelvis W Contrast  Result Date: 08/28/2017 CLINICAL DATA:  Right and left lower abdominal pain. Recent diverticulitis. EXAM: CT ABDOMEN AND PELVIS WITH CONTRAST TECHNIQUE: Multidetector CT imaging of the abdomen and pelvis was performed using the standard protocol  following bolus administration of intravenous contrast. CONTRAST:  ISOVUE-300 IOPAMIDOL (ISOVUE-300) INJECTION 61% COMPARISON:  CT 08/03/2017, 02/26/2017, 08/28/2016 FINDINGS: Lower chest: Hypoventilatory changes at the lung bases with atelectasis in the right middle and lower lobes. No pleural fluid. Scattered coronary artery calcifications. Hepatobiliary: Unchanged hepatic lesions from prior exam. Subcapsular 5 cm hypodense lesion in segment 7 is unchanged. Peripherally enhancing 2.4 cm lesion in the left lobe consistent with hemangioma, unchanged. Smaller hypodense and enhancing lesions are also stable likely combination of cysts or hemangiomas. Gallbladder is nondistended, no calcified gallstone. No biliary dilatation. Pancreas: No ductal dilatation or inflammation. Spleen: Normal in size. Tiny subcapsular lesion superiorly is unchanged, too small to characterize, likely cyst or hemangioma. Adrenals/Urinary Tract: No adrenal nodule. Prominence of both renal pelvises, progressed from prior exam without frank hydronephrosis. No perinephric edema. Unchanged 11 mm cyst in the upper left kidney, with adjacent subcentimeter hypodensity laterally, too small to characterize. Symmetric excretion on delayed phase imaging. Urinary bladder partially distended, no bladder wall  thickening. Slight irregular bladder morphology is unchanged. Stomach/Bowel: Moderate length segment colonic wall thickening and pericolonic soft tissue stranding involving the distal descending and sigmoid colon in the region of multiple colonic diverticulum. Degree of soft tissue stranding has increased from prior exam. No extraluminal air or abscess. Moderate colonic stool in the more proximal colon. No small bowel dilatation or inflammation. Appendix not visualized. Stomach is nondistended. Vascular/Lymphatic: Mild aorta bi-iliac atherosclerosis. No enlarged abdominal or pelvic lymph nodes. Reproductive: Post hysterectomy. Cystic changes in the left ovary which is increased from prior exam. The ovary is intimately related to the sigmoid colon in the region of inflammation. Right ovary not confidently visualized. Other: No free air. Edema tracks in the left pericolic gutter from sigmoid colonic inflammation. Minimal fat in both inguinal canals. Tiny fat containing umbilical hernia. Musculoskeletal: Hemi transitional lumbosacral anatomy with degenerative change in the lumbar spine. There are no acute or suspicious osseous abnormalities. IMPRESSION: 1. Sigmoid colonic wall thickening and pericolonic inflammatory changes in the region of multiple diverticula, recurrent or progressed from prior exam. Findings are concerning for recurrent diverticulitis, or less likely colitis. Similar wall thickening was seen in this region on prior exams including earlier this month, 6 months prior and 1 year prior. Recommend direct visualization with colonoscopy to exclude possibility of underlying neoplastic lesion as cause of colonic wall thickening and intermittent inflammation. Chronic/recurrent diverticulitis could also have this appearance. 2. Increasing cystic changes in the left ovary, which is intimately related to sigmoid colon in the region of inflammation. Findings may be sympathetic/reactive, however are nonspecific.  Colo-ovarian fistulous changes can occur, however are exceedingly rare. Recommend follow-up after course of treatment. 3. Stable liver lesions likely combination of cysts and hemangiomas. 4. Prominent bilateral renal pelvises, without frank hydronephrosis or perinephric edema, likely extrarenal pelvis configuration. 5.  Aortic Atherosclerosis (ICD10-I70.0). Electronically Signed   By: Rubye Oaks M.D.   On: 08/28/2017 22:26    EKG: ordered  Assessment/Plan Principal Problem:   Diverticulitis Active Problems:   HTN (hypertension)   Hyperlipidemia   Diverticulitis N.p.o. IV fluids Zosyn started, patient had previously failed combination of Flagyl and Cipro Likely will need GI consult and colonoscopy  CKD Monitor Cr daily Cr at baseline,  1.1-1.3  Hypertension When necessary hydralazine 10 mg IV as needed for severe blood pressure Cont hyzaar Hold Maxzide  CAD Cont ASA, coreg  HLD Hold fish oil  Arthritis Cont Mtx & folvite Hold prn prednisone  Code Status: FC  DVT Prophylaxis: lovenox Family Communication: at bedside brother Disposition Plan: Pending Improvement  Status: inpt medsurg  Haydee SalterPhillip M Hobbs, MD Family Medicine [facility] [email or web address] Password [email or web address]

## 2017-08-29 NOTE — Progress Notes (Signed)
Patient seen and examined, database reviewed.  Discussed with patient and 2 friends at bedside at patient's request.  This is her third episode of diverticulitis.  She failed to improve with an outpatient course of Cipro and Flagyl.  Already feels much better on Zosyn.  We will keep n.p.o. today, if no emesis and decreased pain tomorrow, will consider advancing diet.  She had a colonoscopy in March of this year which per her report was "normal" and only had a polyp.  I do believe she should have a repeat colonoscopy once this episode improves.  We will continue to follow.  Peggye PittEstela Hernandez, MD [facility] Pager: [phone]

## 2017-08-29 NOTE — Care Management Note (Signed)
Case Management Note  Patient Details  Name: Monique Wagner MRN: [id number] Date of Birth: 12/17/1940  Subjective/Objective:              Admitted with diverticulitis. Chart reviewed for CM needs. Pt is from home, lives alone. Is very ind per nursing and ambulating in room unassisted. Pt has PCP and insurance with drug coverage. Pt drives herself to appointments.       Action/Plan: Pt will DC home with self care. No CM needs noted at this time. May be consulted if needs arise.   Expected Discharge Date:     08/30/2017             Expected Discharge Plan:  Home/Self Care  In-House Referral:  NA  Discharge planning Services  CM Consult  Post Acute Care Choice:  NA Choice offered to:  NA  Status of Service:  Completed, signed off  Malcolm MetroChildress, Willadene Mounsey Demske, RN 08/29/2017, 12:58 PM

## 2017-08-29 NOTE — Progress Notes (Addendum)
ANTIBIOTIC CONSULT NOTE-Preliminary  Pharmacy Consult for Zosyn Indication: Intra-abdominal infection  No Known Allergies  Patient Measurements: Height: 5' 3.75" (161.9 cm) Weight: 177 lb 6.4 oz (80.5 kg) IBW/kg (Calculated) : 54.13  Vital Signs: Temp: 98.8 F (37.1 C) (11/29 0039) Temp Source: Oral (11/29 0039) BP: 115/58 (11/29 0039) Pulse Rate: 78 (11/29 0039)  Labs: Recent Labs    08/28/17 1955  WBC 7.5  HGB 10.8*  PLT 285  CREATININE 1.30*    Estimated Creatinine Clearance: 37.6 mL/min (A) (by C-G formula based on SCr of 1.3 mg/dL (H)).  No results for input(s): VANCOTROUGH, VANCOPEAK, VANCORANDOM, GENTTROUGH, GENTPEAK, GENTRANDOM, TOBRATROUGH, TOBRAPEAK, TOBRARND, AMIKACINPEAK, AMIKACINTROU, AMIKACIN in the last 72 hours.   Microbiology: No results found for this or any previous visit (from the past 720 hour(s)).  Medical History: Past Medical History:  Diagnosis Date  . Arthritis   . Diverticulitis   . HTN (hypertension)   . Hyperlipidemia     Medications:  Ciprofloxacin 400 mg IV x 1 dose in the ED Metronidazole 500 mg IV x 1 dose in the ED  Assessment: 76 yo female with recent history of diverticulitis, failed outpatient oral antibiotic tx with recurrent symptoms. Pharmacy has been consulted for Zosyn dosing.  Goal of Therapy:  Eradicate infection  Plan:  Preliminary review of pertinent patient information completed.  Protocol will be initiated with dose of Zosyn 3.375 Gm IV.  Jeani HawkingAnnie Penn clinical pharmacist will complete review during morning rounds to assess patient and finalize treatment regimen if needed.  Arelia SneddonMason, Mary Anne, Carol[facility] RehabilitationRPH 08/29/2017,1:21 AM   Addum:  Continue zosyn 3.375 gm IV q8 hours Thanks for allowing pharmacy to be a part of this patient's care.  Talbert CageLora Elfriede Bonini, PharmD Clinical Pharmacist

## 2017-08-30 ENCOUNTER — Encounter (HOSPITAL_COMMUNITY): Payer: Self-pay | Admitting: Gastroenterology

## 2017-08-30 ENCOUNTER — Encounter (HOSPITAL_COMMUNITY)
Admission: EM | Disposition: A | Discharge status: Home / Self Care | Payer: Self-pay | Source: Home / Self Care | Attending: Internal Medicine

## 2017-08-30 ENCOUNTER — Other Ambulatory Visit: Payer: Self-pay

## 2017-08-30 DIAGNOSIS — K5792 Diverticulitis of intestine, part unspecified, without perforation or abscess without bleeding: Secondary | ICD-10-CM

## 2017-08-30 DIAGNOSIS — I10 Essential (primary) hypertension: Secondary | ICD-10-CM

## 2017-08-30 DIAGNOSIS — D649 Anemia, unspecified: Secondary | ICD-10-CM

## 2017-08-30 DIAGNOSIS — R1030 Lower abdominal pain, unspecified: Secondary | ICD-10-CM

## 2017-08-30 SURGERY — SIGMOIDOSCOPY, FLEXIBLE
Anesthesia: Moderate Sedation

## 2017-08-30 MED ORDER — CIPROFLOXACIN HCL 250 MG PO TABS
500.0000 mg | ORAL_TABLET | Freq: Two times a day (BID) | ORAL | Status: DC
  Administered 2017-08-30 – 2017-08-31 (×2): 500 mg via ORAL
  Filled 2017-08-30 (×2): qty 2

## 2017-08-30 MED ORDER — METRONIDAZOLE 500 MG PO TABS
500.0000 mg | ORAL_TABLET | Freq: Three times a day (TID) | ORAL | Status: DC
  Administered 2017-08-30 – 2017-08-31 (×2): 500 mg via ORAL
  Filled 2017-08-30 (×2): qty 1

## 2017-08-30 NOTE — Consult Note (Signed)
Referring Provider: [facility] Primary Care Physician:  Mirna Mires, MD Primary Gastroenterologist:  Dr. Bosie Clos [facility])  Date of Admission: 08/28/17 Date of Consultation: 08/30/17  Reason for Consultation:  Recurrent diverticulitis ? Need for colonoscopy  HPI:  Monique Wagner is a 76 y.o. female with a past medical history of arthritis, diverticulitis, hypertension, hyperlipidemia.  Patient presented to the emergency room with complaints of abdominal pain.  2 recent ER visits for diverticulitis placed on outpatient Cipro and Flagyl.  Per hospitalist admission note the patient never feels like her symptoms truly resolved.  Decreased appetite with 6 pound weight loss.  No fever or chills, diarrhea.  He presented feeling he might be constipated due to no bowel movement in the previous 7 days.  Serological evaluation in the emergency room found a CMP with mildly bumped creatinine of 1.30 although within baseline, normal liver function, essentially normal electrolytes. CBC without leukocytosis (7.5), mild anemia with a hemoglobin of 10.8.  CT found sigmoid colonic wall thickening and pericolonic inflammation changes in the region of multiple diverticula, recurrent or progressed from previous exam.  This was thought to be consistent with recurrent diverticulitis or less likely colitis.  Similar findings were seen earlier this month, 6 months prior, one year prior.  Recommended direct visualization with colonoscopy to exclude underlying neoplastic lesion as cause for wall thickening and intermittent inflammation.  Incidentally found stable liver lesions likely combination of cysts and hemangiomas.  No other pertinent GI findings.  The patient was admitted n.p.o. on IV Zosyn due to failure to improve on outpatient oral Cipro and Flagyl.  Her CBC today shows some decline in hgb to 9.1, normocytic.   Today she states this is a third bout of diverticulitis this year.  She does not feel that she  ever really got better on oral antibiotics.  She was seen at [facility] may increase her antibiotics orally.  This is in November.  She never really felt good after that.  This past week she was having some abdominal discomfort which felt like constipation and she felt that if she could have a bowel movement and she would feel better.  She was never able to really have a substantial bowel movement.  Her pain got aggressively worse at which point she presented to the emergency department and was admitted.  She is feeling much better today on the IV antibiotics stating this is the best she has felt since before November.  She is asking about having something to drink.  Denies any ongoing abdominal pain, nausea, vomiting.  She has had some very tiny bowel movements and denies hematochezia or melena.  No other GI symptoms at this time.  Her primary GI is Dr. Bosie Clos with [facility].  Prefers to follow-up with him as an outpatient due to her long-standing patient-physician relationship with him.  Past Medical History:  Diagnosis Date  . Arthritis   . Diverticulitis   . HTN (hypertension)   . Hyperlipidemia     Past Surgical History:  Procedure Laterality Date  . ABDOMINAL HYSTERECTOMY    . BREAST SURGERY      Prior to Admission medications   Medication Sig Start Date End Date Taking? Authorizing Provider  acidophilus (RISAQUAD) CAPS capsule Take 1 capsule by mouth daily.   Yes [provider]  aspirin 325 MG EC tablet Take 325 mg by mouth daily.   Yes [provider]  CALCIUM PO Take 1 tablet by mouth daily.   Yes [provider]  carvedilol (COREG CR) 20 MG 24 hr capsule Take 20 mg by mouth daily.   Yes [provider]  COD LIVER OIL PO Take 1 capsule by mouth daily.   Yes [provider]  docusate sodium (COLACE) 100 MG capsule Take 1 capsule (100 mg total) every 12 (twelve) hours by mouth. 08/06/17  Yes Ward, Layla MawKristen N, DO  folic acid (FOLVITE) 1 MG  tablet Take 1 mg by mouth daily. 07/18/16  Yes [provider]  losartan-hydrochlorothiazide (HYZAAR) 50-12.5 MG per tablet Take 1 tablet by mouth daily.   Yes [provider]  methotrexate (RHEUMATREX) 2.5 MG tablet Take 10 mg by mouth 2 (two) times a week. Take on Saturday and Sunday. 07/21/16  Yes [provider]  milk thistle 175 MG tablet Take 175 mg by mouth daily.   Yes [provider]  Multiple Vitamins-Minerals (MULTIVITAMIN PO) Take 1 tablet by mouth daily.    Yes [provider]  Omega-3 Fatty Acids (FISH OIL PO) Take 1 capsule by mouth daily.    Yes [provider]  ondansetron (ZOFRAN ODT) 4 MG disintegrating tablet Take 1 tablet (4 mg total) every 8 (eight) hours as needed by mouth for nausea or vomiting. 08/06/17  Yes Ward, Kristen N, DO  OVER THE COUNTER MEDICATION Take 1 tablet by mouth daily. [place] (vitamin)   Yes [provider]  oxyCODONE-acetaminophen (PERCOCET/ROXICET) 5-325 MG tablet Take 1 tablet every 6 (six) hours as needed by mouth. 08/06/17  Yes Ward, Layla MawKristen N, DO  predniSONE (DELTASONE) 10 MG tablet Take 0.5 tablets by mouth daily as needed. arthritis 08/04/14  Yes [provider]  triamterene-hydrochlorothiazide (MAXZIDE-25) 37.5-25 MG tablet Take 1 tablet by mouth daily. 06/17/16  Yes [provider]  ciprofloxacin (CIPRO) 500 MG tablet Take 1 tablet (500 mg total) by mouth 3 (three) times daily. Patient not taking: Reported on 08/28/2017 08/03/17   Vanetta MuldersZackowski, Scott, MD  metroNIDAZOLE (FLAGYL) 500 MG tablet Take 1 tablet (500 mg total) by mouth 2 (two) times daily. Patient not taking: Reported on 08/28/2017 08/03/17   Vanetta MuldersZackowski, Scott, MD  metroNIDAZOLE (FLAGYL) 500 MG tablet Take 1 tablet (500 mg total) 3 (three) times daily by mouth. Patient not taking: Reported on 08/28/2017 08/06/17   Ward, Layla MawKristen N, DO    Current Facility-Administered Medications  Medication Dose Route Frequency  Provider Last Rate Last Dose  . 0.9 %  sodium chloride infusion   Intravenous Continuous Haydee SalterHobbs, Phillip M, MD   Stopped at 08/30/17 [phone]  . acetaminophen (TYLENOL) tablet 650 mg  650 mg Oral Q6H PRN Haydee SalterHobbs, Phillip M, MD       Or  . acetaminophen (TYLENOL) suppository 650 mg  650 mg Rectal Q6H PRN Haydee SalterHobbs, Phillip M, MD      . aspirin EC tablet 325 mg  325 mg Oral Daily Haydee SalterHobbs, Phillip M, MD   325 mg at 08/29/17 0850  . carvedilol (COREG CR) 24 hr capsule 20 mg  20 mg Oral Daily Haydee SalterHobbs, Phillip M, MD   20 mg at 08/29/17 1024  . enoxaparin (LOVENOX) injection 40 mg  40 mg Subcutaneous Q24H Haydee SalterHobbs, Phillip M, MD   40 mg at 08/29/17 1024  . folic acid (FOLVITE) tablet 1 mg  1 mg Oral Daily Haydee SalterHobbs, Phillip M, MD   1 mg at 08/29/17 0850  . hydrochlorothiazide (MICROZIDE) capsule 12.5 mg  12.5 mg Oral Daily Haydee SalterHobbs, Phillip M, MD   12.5 mg at 08/29/17 0850  .  HYDROmorphone (DILAUDID) injection 0.5 mg  0.5 mg Intravenous Q4H PRN Haydee Salter, MD      . losartan (COZAAR) tablet 50 mg  50 mg Oral Daily Haydee Salter, MD   50 mg at 08/29/17 0850  . [START ON 08/31/2017] methotrexate (RHEUMATREX) tablet 10 mg  10 mg Oral Once per day on Sun Sat Haydee Salter, MD      . ondansetron [facility]) tablet 4 mg  4 mg Oral Q6H PRN Haydee Salter, MD       Or  . ondansetron [facility]) injection 4 mg  4 mg Intravenous Q6H PRN Haydee Salter, MD      . piperacillin-tazobactam (ZOSYN) IVPB 3.375 g  3.375 g Intravenous Q8H Philip Aspen, Limmie Patricia, MD   Stopped at 08/30/17 0615    Allergies as of 08/28/2017  . (No Known Allergies)    Family History  Problem Relation Age of Onset  . Arthritis Unknown   . Cancer Unknown   . Diabetes Unknown   . Heart disease Other     Social History   Socioeconomic History  . Marital status: Widowed    Spouse name: Not on file  . Number of children: Not on file  . Years of education: Not on file  . Highest education level: Not on file  Social Needs  . Financial  resource strain: Not on file  . Food insecurity - worry: Not on file  . Food insecurity - inability: Not on file  . Transportation needs - medical: Not on file  . Transportation needs - non-medical: Not on file  Occupational History  . Not on file  Tobacco Use  . Smoking status: Never Smoker  . Smokeless tobacco: Never Used  Substance and Sexual Activity  . Alcohol use: No  . Drug use: No  . Sexual activity: Yes    Birth control/protection: Surgical  Other Topics Concern  . Not on file  Social History Narrative  . Not on file    Review of Systems: General: Negative for anorexia, weight loss, fever, chills, fatigue, weakness. ENT: Negative for hoarseness, difficulty swallowing. CV: Negative for chest pain, angina, palpitations, peripheral edema.  Respiratory: Negative for dyspnea at rest, cough, sputum, wheezing.  GI: See history of present illness. MS: Negative for joint pain, low back pain.  Derm: Negative for rash or itching.  Endo: Negative for unusual weight change.  Heme: Negative for bruising or bleeding. Allergy: Negative for rash or hives.  Physical Exam: Vital signs in last 24 hours: Temp:  [98.2 F (36.8 C)-98.3 F (36.8 C)] 98.3 F (36.8 C) (11/30 0655) Pulse Rate:  [78-86] 86 (11/30 0655) Resp:  [18] 18 (11/30 0655) BP: (119-126)/(60-61) 119/61 (11/30 0655) SpO2:  [98 %-100 %] 98 % (11/30 0655) Last BM Date: 08/30/17 General:   Alert,  Well-developed, well-nourished, pleasant and cooperative in NAD Head:  Normocephalic and atraumatic. Eyes:  Sclera clear, no icterus. Conjunctiva pink. Ears:  Normal auditory acuity. Neck:  Supple; no masses or thyromegaly. Lungs:  Clear throughout to auscultation. No wheezes, crackles, or rhonchi. No acute distress. Heart:  Regular rate and rhythm; no murmurs, clicks, rubs,  or gallops. Abdomen:  Soft, nontender and nondistended. No masses, hepatosplenomegaly or hernias noted. Normal bowel sounds, without guarding, and  without rebound.   Rectal:  Deferred.   Msk:  Symmetrical without gross deformities. Pulses:  Normal bilateral DP pulses noted. Extremities:  Without clubbing or edema. Neurologic:  Alert and  oriented x4;  grossly normal neurologically. Psych:  Alert and cooperative. Normal mood and affect.  Intake/Output from previous day: 11/29 0701 - 11/30 0700 In: 2058.3 [P.O.:30; I.V.:1928.3; IV Piggyback:100] Out: -  Intake/Output this shift: No intake/output data recorded.  Lab Results: Recent Labs    08/28/17 1955 08/29/17 0442  WBC 7.5 6.6  HGB 10.8* 9.1*  HCT 34.3* 28.3*  PLT 285 227   BMET Recent Labs    08/28/17 1955 08/29/17 0442  NA 137 138  K 3.6 3.5  CL 97* 100*  CO2 30 29  GLUCOSE 127* 110*  BUN 18 15  CREATININE 1.30* 1.42*  CALCIUM 10.4* 9.2   LFT Recent Labs    08/28/17 1955  PROT 7.6  ALBUMIN 3.6  AST 39  ALT 30  ALKPHOS 90  BILITOT 0.8   PT/INR No results for input(s): LABPROT, INR in the last 72 hours. Hepatitis Panel No results for input(s): HEPBSAG, HCVAB, HEPAIGM, HEPBIGM in the last 72 hours. C-Diff No results for input(s): CDIFFTOX in the last 72 hours.  Studies/Results: Ct Abdomen Pelvis W Contrast  Result Date: 08/28/2017 CLINICAL DATA:  Right and left lower abdominal pain. Recent diverticulitis. EXAM: CT ABDOMEN AND PELVIS WITH CONTRAST TECHNIQUE: Multidetector CT imaging of the abdomen and pelvis was performed using the standard protocol following bolus administration of intravenous contrast. CONTRAST:  100mL ISOVUE-300 IOPAMIDOL (ISOVUE-300) INJECTION 61% COMPARISON:  CT 08/03/2017, 02/26/2017, 08/28/2016 FINDINGS: Lower chest: Hypoventilatory changes at the lung bases with atelectasis in the right middle and lower lobes. No pleural fluid. Scattered coronary artery calcifications. Hepatobiliary: Unchanged hepatic lesions from prior exam. Subcapsular 5 cm hypodense lesion in segment 7 is unchanged. Peripherally enhancing 2.4 cm lesion in  the left lobe consistent with hemangioma, unchanged. Smaller hypodense and enhancing lesions are also stable likely combination of cysts or hemangiomas. Gallbladder is nondistended, no calcified gallstone. No biliary dilatation. Pancreas: No ductal dilatation or inflammation. Spleen: Normal in size. Tiny subcapsular lesion superiorly is unchanged, too small to characterize, likely cyst or hemangioma. Adrenals/Urinary Tract: No adrenal nodule. Prominence of both renal pelvises, progressed from prior exam without frank hydronephrosis. No perinephric edema. Unchanged 11 mm cyst in the upper left kidney, with adjacent subcentimeter hypodensity laterally, too small to characterize. Symmetric excretion on delayed phase imaging. Urinary bladder partially distended, no bladder wall thickening. Slight irregular bladder morphology is unchanged. Stomach/Bowel: Moderate length segment colonic wall thickening and pericolonic soft tissue stranding involving the distal descending and sigmoid colon in the region of multiple colonic diverticulum. Degree of soft tissue stranding has increased from prior exam. No extraluminal air or abscess. Moderate colonic stool in the more proximal colon. No small bowel dilatation or inflammation. Appendix not visualized. Stomach is nondistended. Vascular/Lymphatic: Mild aorta bi-iliac atherosclerosis. No enlarged abdominal or pelvic lymph nodes. Reproductive: Post hysterectomy. Cystic changes in the left ovary which is increased from prior exam. The ovary is intimately related to the sigmoid colon in the region of inflammation. Right ovary not confidently visualized. Other: No free air. Edema tracks in the left pericolic gutter from sigmoid colonic inflammation. Minimal fat in both inguinal canals. Tiny fat containing umbilical hernia. Musculoskeletal: Hemi transitional lumbosacral anatomy with degenerative change in the lumbar spine. There are no acute or suspicious osseous abnormalities.  IMPRESSION: 1. Sigmoid colonic wall thickening and pericolonic inflammatory changes in the region of multiple diverticula, recurrent or progressed from prior exam. Findings are concerning for recurrent diverticulitis, or less likely colitis. Similar wall thickening was seen in this region on prior exams  including earlier this month, 6 months prior and 1 year prior. Recommend direct visualization with colonoscopy to exclude possibility of underlying neoplastic lesion as cause of colonic wall thickening and intermittent inflammation. Chronic/recurrent diverticulitis could also have this appearance. 2. Increasing cystic changes in the left ovary, which is intimately related to sigmoid colon in the region of inflammation. Findings may be sympathetic/reactive, however are nonspecific. Colo-ovarian fistulous changes can occur, however are exceedingly rare. Recommend follow-up after course of treatment. 3. Stable liver lesions likely combination of cysts and hemangiomas. 4. Prominent bilateral renal pelvises, without frank hydronephrosis or perinephric edema, likely extrarenal pelvis configuration. 5.  Aortic Atherosclerosis (ICD10-I70.0). Electronically Signed   By: Rubye Oaks M.D.   On: 08/28/2017 22:26    Impression: Very pleasant 76 year old female now with her third bout of diverticulitis within the past year.  Her previous oral antibiotic treatment was not effective and she is now on IV Zosyn which seems to be working well.  Her abdominal pain is substantially improved.  She states she has had a colonoscopy within the past year, approximately March or April of this year.  This was completed by Dr. Bosie Clos with [facility] [place].  She prefers to follow-up with him as an outpatient for any recommended outpatient care.  She is aware of the possibilities of surgery for partial colectomy given her recurrent diverticulitis.  Overall she is feeling well today.  Decline in hgb without obvious  GI bleed possibly dilutional given not eating/drinking much before admission.  Plan: 1. Continue IV antibiotics 2. Monitor H/H 3. Can consider advancing to clear liquid diet 4. Supportive measures 5. Recommend follow-up with primary GI as an outpatient for consideration of outpatient colonoscopy. 6. Recommend follow-up with surgery for consideration of elective partial colectomy given recurrent diverticulitis   Thank you for allowing [place] to participate in the care of Sahirah T Mccree  Shayle Donahoo, DNP, AGNP-C Adult & Gerontological Nurse Practitioner [facility]    LOS: 2 days     08/30/2017, 8:32 AM  ADDENDUM: Discussed with Dr. Darrick Penna who recommends flexible sigmoidoscopy today as IP. Enema to be given in endoscopy unit.

## 2017-08-30 NOTE — Care Management Important Message (Signed)
Important Message  Patient Details  Name: Augustine RadarMarian T Arreola MRN: [id number] Date of Birth: 1941-06-29   Medicare Important Message Given:  Yes    Bascom Biel, Chrystine OilerSharley Diane, RN 08/30/2017, 12:04 PM

## 2017-08-30 NOTE — Progress Notes (Signed)
Multiple IV attempts from several staff members. Discussed with MD. Vascular wellness on campus, they will attempt with ultrasound. Will follow- up after that attempt.

## 2017-08-30 NOTE — Progress Notes (Signed)
PROGRESS NOTE    Monique Wagner  OZH:[id number] DOB: 27-Jan-1941 DOA: 08/28/2017 PCP: Mirna MiresHill, Gerald, MD     Brief Narrative:  76 year old woman admitted from home on 8 due to abdominal pain and found to have diverticulitis on CT scan.  This is her third episode of diverticulitis and failed to improve after an outpatient course of Cipro and Flagyl.  Admitted on Zosyn with significant improvement, seen by GI no plans for further intervention at this point   Assessment & Plan:   Principal Problem:   Diverticulitis Active Problems:   HTN (hypertension)   Hyperlipidemia   Anemia   Lower abdominal pain   Acute uncomplicated recurrent diverticulitis -Continue Zosyn for now, advance diet to clears. -Can discharge home once tolerate solids. -We will need to see surgery as an outpatient for consideration of elective partial colectomy.  Chronic kidney disease stage III -Creatinine remains at baseline of between 1.2 and 1.4.  Hypertension -Well-controlled.  Coronary artery disease -Stable, no chest pain, continue aspirin, Coreg.  Hyperlipidemia -If tolerates diet will resume fish oil on discharge tomorrow   DVT prophylaxis: SCDs Code Status: Full code Family Communication: Sister at bedside updated on plan of care and questions answered Disposition Plan: Likely discharge home in 24 hours if tolerates diet  Consultants:   GI  Procedures:   None  Antimicrobials:  Anti-infectives (From admission, onward)   Start     Dose/Rate Route Frequency Ordered Stop   08/29/17 0800  piperacillin-tazobactam (ZOSYN) IVPB 3.375 g     3.375 g 12.5 mL/hr over 240 Minutes Intravenous Every 8 hours 08/29/17 0745     08/29/17 0000  piperacillin-tazobactam (ZOSYN) IVPB 3.375 g     3.375 g 12.5 mL/hr over 240 Minutes Intravenous  Once 08/28/17 2333 08/29/17 0347   08/28/17 2315  ciprofloxacin (CIPRO) IVPB 400 mg     400 mg 200 mL/hr over 60 Minutes Intravenous  Once 08/28/17 2304 08/29/17  0040   08/28/17 2315  metroNIDAZOLE (FLAGYL) IVPB 500 mg     500 mg 100 mL/hr over 60 Minutes Intravenous  Once 08/28/17 2304 08/29/17 0040       Subjective: Feels significantly improved, no abdominal pain, no emesis or nausea  Objective: Vitals:   08/29/17 1731 08/29/17 2033 08/30/17 0655 08/30/17 1222  BP:  126/60 119/61 (!) 121/54  Pulse:  78 86 79  Resp:  18 18 18   Temp:  98.2 F (36.8 C) 98.3 F (36.8 C) 98.4 F (36.9 C)  TempSrc:  Oral Oral Oral  SpO2: 100% 98% 98% 100%  Weight:      Height:        Intake/Output Summary (Last 24 hours) at 08/30/2017 1706 Last data filed at 08/30/2017 1200 Gross per 24 hour  Intake 1205 ml  Output 900 ml  Net 305 ml   Filed Weights   08/28/17 1927 08/29/17 0039  Weight: 79.8 kg (176 lb) 80.5 kg (177 lb 6.4 oz)    Examination:  General exam: Alert, awake, oriented x 3 Respiratory system: Clear to auscultation. Respiratory effort normal. Cardiovascular system:RRR. No murmurs, rubs, gallops. Gastrointestinal system: Abdomen is nondistended, soft and nontender. No organomegaly or masses felt. Normal bowel sounds heard. Central nervous system: Alert and oriented. No focal neurological deficits. Extremities: No C/C/E, +pedal pulses Skin: No rashes, lesions or ulcers Psychiatry: Judgement and insight appear normal. Mood & affect appropriate.     Data Reviewed: I have personally reviewed following labs and imaging studies  CBC: Recent Labs  Lab 08/28/17 1955 08/29/17 0442  WBC 7.5 6.6  HGB 10.8* 9.1*  HCT 34.3* 28.3*  MCV 95.8 96.6  PLT 285 227   Basic Metabolic Panel: Recent Labs  Lab 08/28/17 1955 08/29/17 0442  NA 137 138  K 3.6 3.5  CL 97* 100*  CO2 30 29  GLUCOSE 127* 110*  BUN 18 15  CREATININE 1.30* 1.42*  CALCIUM 10.4* 9.2   GFR: Estimated Creatinine Clearance: 34.4 mL/min (A) (by C-G formula based on SCr of 1.42 mg/dL (H)). Liver Function Tests: Recent Labs  Lab 08/28/17 1955  AST 39  ALT 30    ALKPHOS 90  BILITOT 0.8  PROT 7.6  ALBUMIN 3.6   Recent Labs  Lab 08/28/17 1955  LIPASE 19   No results for input(s): AMMONIA in the last 168 hours. Coagulation Profile: No results for input(s): INR, PROTIME in the last 168 hours. Cardiac Enzymes: No results for input(s): CKTOTAL, CKMB, CKMBINDEX, TROPONINI in the last 168 hours. BNP (last 3 results) No results for input(s): PROBNP in the last 8760 hours. HbA1C: No results for input(s): HGBA1C in the last 72 hours. CBG: No results for input(s): GLUCAP in the last 168 hours. Lipid Profile: No results for input(s): CHOL, HDL, LDLCALC, TRIG, CHOLHDL, LDLDIRECT in the last 72 hours. Thyroid Function Tests: No results for input(s): TSH, T4TOTAL, FREET4, T3FREE, THYROIDAB in the last 72 hours. Anemia Panel: No results for input(s): VITAMINB12, FOLATE, FERRITIN, TIBC, IRON, RETICCTPCT in the last 72 hours. Urine analysis:    Component Value Date/Time   COLORURINE YELLOW 08/28/2017 2110   APPEARANCEUR CLEAR 08/28/2017 2110   LABSPEC 1.008 08/28/2017 2110   PHURINE 6.0 08/28/2017 2110   GLUCOSEU NEGATIVE 08/28/2017 2110   HGBUR SMALL (A) 08/28/2017 2110   BILIRUBINUR NEGATIVE 08/28/2017 2110   KETONESUR NEGATIVE 08/28/2017 2110   PROTEINUR NEGATIVE 08/28/2017 2110   NITRITE NEGATIVE 08/28/2017 2110   LEUKOCYTESUR NEGATIVE 08/28/2017 2110   Sepsis Labs: @LABRCNTIP (procalcitonin:4,lacticidven:4)  )No results found for this or any previous visit (from the past 240 hour(s)).       Radiology Studies: Ct Abdomen Pelvis W Contrast  Result Date: 08/28/2017 CLINICAL DATA:  Right and left lower abdominal pain. Recent diverticulitis. EXAM: CT ABDOMEN AND PELVIS WITH CONTRAST TECHNIQUE: Multidetector CT imaging of the abdomen and pelvis was performed using the standard protocol following bolus administration of intravenous contrast. CONTRAST:  100mL ISOVUE-300 IOPAMIDOL (ISOVUE-300) INJECTION 61% COMPARISON:  CT 08/03/2017,  02/26/2017, 08/28/2016 FINDINGS: Lower chest: Hypoventilatory changes at the lung bases with atelectasis in the right middle and lower lobes. No pleural fluid. Scattered coronary artery calcifications. Hepatobiliary: Unchanged hepatic lesions from prior exam. Subcapsular 5 cm hypodense lesion in segment 7 is unchanged. Peripherally enhancing 2.4 cm lesion in the left lobe consistent with hemangioma, unchanged. Smaller hypodense and enhancing lesions are also stable likely combination of cysts or hemangiomas. Gallbladder is nondistended, no calcified gallstone. No biliary dilatation. Pancreas: No ductal dilatation or inflammation. Spleen: Normal in size. Tiny subcapsular lesion superiorly is unchanged, too small to characterize, likely cyst or hemangioma. Adrenals/Urinary Tract: No adrenal nodule. Prominence of both renal pelvises, progressed from prior exam without frank hydronephrosis. No perinephric edema. Unchanged 11 mm cyst in the upper left kidney, with adjacent subcentimeter hypodensity laterally, too small to characterize. Symmetric excretion on delayed phase imaging. Urinary bladder partially distended, no bladder wall thickening. Slight irregular bladder morphology is unchanged. Stomach/Bowel: Moderate length segment colonic wall thickening and pericolonic soft tissue stranding involving the distal descending and sigmoid  colon in the region of multiple colonic diverticulum. Degree of soft tissue stranding has increased from prior exam. No extraluminal air or abscess. Moderate colonic stool in the more proximal colon. No small bowel dilatation or inflammation. Appendix not visualized. Stomach is nondistended. Vascular/Lymphatic: Mild aorta bi-iliac atherosclerosis. No enlarged abdominal or pelvic lymph nodes. Reproductive: Post hysterectomy. Cystic changes in the left ovary which is increased from prior exam. The ovary is intimately related to the sigmoid colon in the region of inflammation. Right ovary not  confidently visualized. Other: No free air. Edema tracks in the left pericolic gutter from sigmoid colonic inflammation. Minimal fat in both inguinal canals. Tiny fat containing umbilical hernia. Musculoskeletal: Hemi transitional lumbosacral anatomy with degenerative change in the lumbar spine. There are no acute or suspicious osseous abnormalities. IMPRESSION: 1. Sigmoid colonic wall thickening and pericolonic inflammatory changes in the region of multiple diverticula, recurrent or progressed from prior exam. Findings are concerning for recurrent diverticulitis, or less likely colitis. Similar wall thickening was seen in this region on prior exams including earlier this month, 6 months prior and 1 year prior. Recommend direct visualization with colonoscopy to exclude possibility of underlying neoplastic lesion as cause of colonic wall thickening and intermittent inflammation. Chronic/recurrent diverticulitis could also have this appearance. 2. Increasing cystic changes in the left ovary, which is intimately related to sigmoid colon in the region of inflammation. Findings may be sympathetic/reactive, however are nonspecific. Colo-ovarian fistulous changes can occur, however are exceedingly rare. Recommend follow-up after course of treatment. 3. Stable liver lesions likely combination of cysts and hemangiomas. 4. Prominent bilateral renal pelvises, without frank hydronephrosis or perinephric edema, likely extrarenal pelvis configuration. 5.  Aortic Atherosclerosis (ICD10-I70.0). Electronically Signed   By: Rubye Oaks M.D.   On: 08/28/2017 22:26        Scheduled Meds: . aspirin  325 mg Oral Daily  . carvedilol  20 mg Oral Daily  . folic acid  1 mg Oral Daily  . hydrochlorothiazide  12.5 mg Oral Daily  . losartan  50 mg Oral Daily  . [START ON 08/31/2017] methotrexate  10 mg Oral Once per day on Sun Sat   Continuous Infusions: . sodium chloride Stopped (08/30/17 0615)  . piperacillin-tazobactam  (ZOSYN)  IV Stopped (08/30/17 1657)     LOS: 2 days    Time spent: 25 minutes. Greater than 50% of this time was spent in direct contact with the patient coordinating care.     Chaya Jan, MD [facility] Pager [phone]  If 7PM-7AM, please contact night-coverage [email or web address] Password [email or web address] 08/30/2017, 5:06 PM

## 2017-08-30 NOTE — Progress Notes (Signed)
Loss of IV access, MD made aware. States it ok to leave out for now. Transitioned to PO medications.

## 2017-08-31 MED ORDER — CIPROFLOXACIN HCL 500 MG PO TABS: 500.0000 mg | ORAL_TABLET | Freq: Two times a day (BID) | ORAL | 0 refills | Status: DC

## 2017-08-31 MED ORDER — METRONIDAZOLE 500 MG PO TABS: 500.0000 mg | ORAL_TABLET | Freq: Three times a day (TID) | ORAL | Status: AC

## 2017-08-31 NOTE — Discharge Summary (Signed)
Physician Discharge Summary  Monique Wagner:[id number] DOB: [birth year] DOA: 08/28/2017  PCP: Mirna Mires, MD  Admit date: 08/28/2017 Discharge date: 08/31/2017  Time spent: 45 minutes  Recommendations for Outpatient Follow-up:  -Will be discharged home today. -Advised to follow-up with primary care provider in 2 weeks. -We will need to complete a 14-day course of Cipro and Flagyl for her acute diverticulitis. -Have left phone number for Dr. Henreitta Leber in chart as she will need outpatient follow-up to discuss potential elective partial colectomy.  Discharge Diagnoses:  Principal Problem:   Diverticulitis Active Problems:   HTN (hypertension)   Hyperlipidemia   Anemia   Lower abdominal pain   Discharge Condition: Stable and improved  Filed Weights   08/28/17 1927 08/29/17 0039  Weight: 79.8 kg (176 lb) 80.5 kg (177 lb 6.4 oz)    History of present illness:  As per Dr. Melynda Ripple on 11/29:  Monique Wagner is a 76 y.o. female with past medical history significant for hypertension hyperlipidemia and diverticulitis presents the emergency room with abdominal pain.  Patient has been seen in the emergency room twice for diverticulitis.  She was placed on Cipro and Flagyl.  Feels her symptoms have truly resolved.  Has been eating less over the last 6 weeks.  Has lost 6 pounds.  No fevers or chills.  No diarrhea.  Pain does not radiate.  Patient feels he did have a left bowel movements over the last 7 days.  Came to the emergency room for evaluation of constipation.  ED course: Found to have extensive diverticulitis and multiple abnormalities on CT abdomen pelvis with contrast.  Hospitalist consulted for admission.    Hospital Course:   Acute uncomplicated recurrent diverticulitis -Tolerating solid diet. -Has tolerated Cipro Flagyl times 24 hours without issues.  Will continue Cipro Flagyl for 14 days. -We will need to see surgery as an outpatient for consideration of elective  partial colectomy.  Chronic kidney disease stage III -Creatinine remains at baseline of between 1.2 and 1.4.  Hypertension -Well-controlled.  Coronary artery disease -Stable, no chest pain, continue aspirin, Coreg.  Hyperlipidemia -Resume fish oil.    Procedures:  None  Consultations:  None  Discharge Instructions  Discharge Instructions    Diet - low sodium heart healthy   Complete by:  As directed    Increase activity slowly   Complete by:  As directed      Allergies as of 08/31/2017   No Known Allergies     Medication List    STOP taking these medications   ondansetron 4 MG disintegrating tablet Commonly known as:  ZOFRAN ODT   OVER THE COUNTER MEDICATION   oxyCODONE-acetaminophen 5-325 MG tablet Commonly known as:  PERCOCET/ROXICET   predniSONE 10 MG tablet Commonly known as:  DELTASONE     TAKE these medications   acidophilus Caps capsule Take 1 capsule by mouth daily.   aspirin 325 MG EC tablet Take 325 mg by mouth daily.   CALCIUM PO Take 1 tablet by mouth daily.   carvedilol 20 MG 24 hr capsule Commonly known as:  COREG CR Take 20 mg by mouth daily.   ciprofloxacin 500 MG tablet Commonly known as:  CIPRO Take 1 tablet (500 mg total) by mouth 2 (two) times daily. What changed:  when to take this   COD LIVER OIL PO Take 1 capsule by mouth daily.   docusate sodium 100 MG capsule Commonly known as:  COLACE Take 1 capsule (100 mg total)  every 12 (twelve) hours by mouth.   FISH OIL PO Take 1 capsule by mouth daily.   folic acid 1 MG tablet Commonly known as:  FOLVITE Take 1 mg by mouth daily.   losartan-hydrochlorothiazide 50-12.5 MG tablet Commonly known as:  HYZAAR Take 1 tablet by mouth daily.   methotrexate 2.5 MG tablet Commonly known as:  RHEUMATREX Take 10 mg by mouth 2 (two) times a week. Take on Saturday and Sunday.   metroNIDAZOLE 500 MG tablet Commonly known as:  FLAGYL Take 1 tablet (500 mg total) by mouth  every 8 (eight) hours for 14 days. What changed:    when to take this  Another medication with the same name was removed. Continue taking this medication, and follow the directions you see here.   milk thistle 175 MG tablet Take 175 mg by mouth daily.   MULTIVITAMIN PO Take 1 tablet by mouth daily.   triamterene-hydrochlorothiazide 37.5-25 MG tablet Commonly known as:  MAXZIDE-25 Take 1 tablet by mouth daily.      No Known Allergies Follow-up Information    Mirna MiresHill, Gerald, MD. Schedule an appointment as soon as possible for a visit in 2 week(s).   Specialty:  Family Medicine Contact information: [address] [place] [address] [phone]            The results of significant diagnostics from this hospitalization (including imaging, microbiology, ancillary and laboratory) are listed below for reference.    Significant Diagnostic Studies: Ct Abdomen Pelvis W Contrast  Result Date: 08/28/2017 CLINICAL DATA:  Right and left lower abdominal pain. Recent diverticulitis. EXAM: CT ABDOMEN AND PELVIS WITH CONTRAST TECHNIQUE: Multidetector CT imaging of the abdomen and pelvis was performed using the standard protocol following bolus administration of intravenous contrast. CONTRAST:  100mL ISOVUE-300 IOPAMIDOL (ISOVUE-300) INJECTION 61% COMPARISON:  CT 08/03/2017, 02/26/2017, 08/28/2016 FINDINGS: Lower chest: Hypoventilatory changes at the lung bases with atelectasis in the right middle and lower lobes. No pleural fluid. Scattered coronary artery calcifications. Hepatobiliary: Unchanged hepatic lesions from prior exam. Subcapsular 5 cm hypodense lesion in segment 7 is unchanged. Peripherally enhancing 2.4 cm lesion in the left lobe consistent with hemangioma, unchanged. Smaller hypodense and enhancing lesions are also stable likely combination of cysts or hemangiomas. Gallbladder is nondistended, no calcified gallstone. No biliary dilatation. Pancreas: No ductal dilatation or  inflammation. Spleen: Normal in size. Tiny subcapsular lesion superiorly is unchanged, too small to characterize, likely cyst or hemangioma. Adrenals/Urinary Tract: No adrenal nodule. Prominence of both renal pelvises, progressed from prior exam without frank hydronephrosis. No perinephric edema. Unchanged 11 mm cyst in the upper left kidney, with adjacent subcentimeter hypodensity laterally, too small to characterize. Symmetric excretion on delayed phase imaging. Urinary bladder partially distended, no bladder wall thickening. Slight irregular bladder morphology is unchanged. Stomach/Bowel: Moderate length segment colonic wall thickening and pericolonic soft tissue stranding involving the distal descending and sigmoid colon in the region of multiple colonic diverticulum. Degree of soft tissue stranding has increased from prior exam. No extraluminal air or abscess. Moderate colonic stool in the more proximal colon. No small bowel dilatation or inflammation. Appendix not visualized. Stomach is nondistended. Vascular/Lymphatic: Mild aorta bi-iliac atherosclerosis. No enlarged abdominal or pelvic lymph nodes. Reproductive: Post hysterectomy. Cystic changes in the left ovary which is increased from prior exam. The ovary is intimately related to the sigmoid colon in the region of inflammation. Right ovary not confidently visualized. Other: No free air. Edema tracks in the left pericolic gutter from sigmoid  colonic inflammation. Minimal fat in both inguinal canals. Tiny fat containing umbilical hernia. Musculoskeletal: Hemi transitional lumbosacral anatomy with degenerative change in the lumbar spine. There are no acute or suspicious osseous abnormalities. IMPRESSION: 1. Sigmoid colonic wall thickening and pericolonic inflammatory changes in the region of multiple diverticula, recurrent or progressed from prior exam. Findings are concerning for recurrent diverticulitis, or less likely colitis. Similar wall thickening was  seen in this region on prior exams including earlier this month, 6 months prior and 1 year prior. Recommend direct visualization with colonoscopy to exclude possibility of underlying neoplastic lesion as cause of colonic wall thickening and intermittent inflammation. Chronic/recurrent diverticulitis could also have this appearance. 2. Increasing cystic changes in the left ovary, which is intimately related to sigmoid colon in the region of inflammation. Findings may be sympathetic/reactive, however are nonspecific. Colo-ovarian fistulous changes can occur, however are exceedingly rare. Recommend follow-up after course of treatment. 3. Stable liver lesions likely combination of cysts and hemangiomas. 4. Prominent bilateral renal pelvises, without frank hydronephrosis or perinephric edema, likely extrarenal pelvis configuration. 5.  Aortic Atherosclerosis (ICD10-I70.0). Electronically Signed   By: Rubye OaksMelanie  Ehinger M.D.   On: 08/28/2017 22:26   Ct Abdomen Pelvis W Contrast  Result Date: 08/03/2017 CLINICAL DATA:  Abdominal pain, N/V/D x 4 days. Hx of HTN,diverticulitis,hysterectomy,breast surgery EXAM: CT ABDOMEN AND PELVIS WITH CONTRAST TECHNIQUE: Multidetector CT imaging of the abdomen and pelvis was performed using the standard protocol following bolus administration of intravenous contrast. CONTRAST:  100mL ISOVUE-300 IOPAMIDOL (ISOVUE-300) INJECTION 61% COMPARISON:  02/26/2017 FINDINGS: Lower chest: No acute abnormality. Hepatobiliary: Stable liver lesions. 4.9 x 2.3 cm cystic lesion, segment 7. Hemangiomas in segment 3 and 2 in segment 6. Probable small cyst in segment 3. No new liver abnormalities. Normal gallbladder. No bile duct dilation. Pancreas: Unremarkable. No pancreatic ductal dilatation or surrounding inflammatory changes. Spleen: Normal in size without focal abnormality. Adrenals/Urinary Tract: No adrenal masses. 11 mm upper pole left renal cyst, stable. Subcentimeter low-density lesion along the  lateral cortical margin of the left kidney, also likely a cyst and also stable. No other renal masses, no stones and no hydronephrosis. Normal ureters. Bladder is unremarkable. Stomach/Bowel: Mild inflammatory changes noted along the mid sigmoid colon where there are multiple diverticula. Inflammation is new since the prior CT. There is no extraluminal air there are no fluid collection to suggest an abscess. Multiple other left colon diverticular noted with no other inflammatory changes. Stomach and small bowel are unremarkable. Appendix not visualized. Vascular/Lymphatic: No significant vascular findings are present. No enlarged abdominal or pelvic lymph nodes. Reproductive: Status post hysterectomy. No adnexal masses. Other: No abdominal wall hernia or abnormality. No abdominopelvic ascites. Musculoskeletal: No fracture or acute finding. No osteoblastic or osteolytic lesions. IMPRESSION: 1. Mild uncomplicated sigmoid diverticulitis. No extraluminal air. No abscess. 2. No other acute abnormalities within the abdomen or pelvis. 3. Stable liver lesions consistent with a combination of cysts and hemangiomas. Electronically Signed   By: Amie Portlandavid  Ormond M.D.   On: 08/03/2017 11:48    Microbiology: No results found for this or any previous visit (from the past 240 hour(s)).   Labs: Basic Metabolic Panel: Recent Labs  Lab 08/28/17 1955 08/29/17 0442  NA 137 138  K 3.6 3.5  CL 97* 100*  CO2 30 29  GLUCOSE 127* 110*  BUN 18 15  CREATININE 1.30* 1.42*  CALCIUM 10.4* 9.2   Liver Function Tests: Recent Labs  Lab 08/28/17 1955  AST 39  ALT 30  ALKPHOS  90  BILITOT 0.8  PROT 7.6  ALBUMIN 3.6   Recent Labs  Lab 08/28/17 1955  LIPASE 19   No results for input(s): AMMONIA in the last 168 hours. CBC: Recent Labs  Lab 08/28/17 1955 08/29/17 0442  WBC 7.5 6.6  HGB 10.8* 9.1*  HCT 34.3* 28.3*  MCV 95.8 96.6  PLT 285 227   Cardiac Enzymes: No results for input(s): CKTOTAL, CKMB, CKMBINDEX,  TROPONINI in the last 168 hours. BNP: BNP (last 3 results) No results for input(s): BNP in the last 8760 hours.  ProBNP (last 3 results) No results for input(s): PROBNP in the last 8760 hours.  CBG: No results for input(s): GLUCAP in the last 168 hours.     Signed:  Chaya Jan  [facility] Pager: [phone] 08/31/2017, 10:18 AM

## 2018-04-07 ENCOUNTER — Other Ambulatory Visit: Payer: Self-pay | Admitting: Gastroenterology

## 2018-04-07 ENCOUNTER — Ambulatory Visit
Admission: RE | Admit: 2018-04-07 | Discharge: 2018-04-07 | Disposition: A | Discharge status: Home / Self Care | Payer: Medicare Other | Source: Ambulatory Visit | Attending: Gastroenterology | Admitting: Gastroenterology

## 2018-04-07 DIAGNOSIS — K5732 Diverticulitis of large intestine without perforation or abscess without bleeding: Secondary | ICD-10-CM

## 2018-04-07 DIAGNOSIS — R1084 Generalized abdominal pain: Secondary | ICD-10-CM

## 2018-04-07 MED ORDER — IOPAMIDOL (ISOVUE-300) INJECTION 61%
100.0000 mL | Freq: Once | INTRAVENOUS | Status: AC | PRN
  Administered 2018-04-07: 100 mL via INTRAVENOUS

## 2018-04-30 ENCOUNTER — Other Ambulatory Visit (HOSPITAL_COMMUNITY): Payer: Self-pay | Admitting: Family Medicine

## 2018-04-30 DIAGNOSIS — Z1231 Encounter for screening mammogram for malignant neoplasm of breast: Secondary | ICD-10-CM

## 2018-05-30 ENCOUNTER — Ambulatory Visit (HOSPITAL_COMMUNITY)
Admission: RE | Admit: 2018-05-30 | Discharge: 2018-05-30 | Disposition: A | Discharge status: Home / Self Care | Payer: Medicare Other | Source: Ambulatory Visit | Attending: Family Medicine | Admitting: Family Medicine

## 2018-05-30 DIAGNOSIS — Z1231 Encounter for screening mammogram for malignant neoplasm of breast: Secondary | ICD-10-CM | POA: Diagnosis not present

## 2018-07-02 ENCOUNTER — Ambulatory Visit: Payer: Medicare Other

## 2018-07-04 ENCOUNTER — Emergency Department (HOSPITAL_COMMUNITY): Payer: Medicare Other

## 2018-07-04 ENCOUNTER — Emergency Department (HOSPITAL_COMMUNITY)
Admission: EM | Admit: 2018-07-04 | Discharge: 2018-07-04 | Disposition: A | Discharge status: Home / Self Care | Payer: Medicare Other | Attending: Emergency Medicine | Admitting: Emergency Medicine

## 2018-07-04 ENCOUNTER — Other Ambulatory Visit: Payer: Self-pay

## 2018-07-04 ENCOUNTER — Encounter (HOSPITAL_COMMUNITY): Payer: Self-pay | Admitting: Emergency Medicine

## 2018-07-04 DIAGNOSIS — K5792 Diverticulitis of intestine, part unspecified, without perforation or abscess without bleeding: Secondary | ICD-10-CM

## 2018-07-04 DIAGNOSIS — K5732 Diverticulitis of large intestine without perforation or abscess without bleeding: Secondary | ICD-10-CM | POA: Diagnosis not present

## 2018-07-04 DIAGNOSIS — E785 Hyperlipidemia, unspecified: Secondary | ICD-10-CM | POA: Diagnosis not present

## 2018-07-04 DIAGNOSIS — Z79899 Other long term (current) drug therapy: Secondary | ICD-10-CM | POA: Insufficient documentation

## 2018-07-04 DIAGNOSIS — I10 Essential (primary) hypertension: Secondary | ICD-10-CM | POA: Insufficient documentation

## 2018-07-04 DIAGNOSIS — R1030 Lower abdominal pain, unspecified: Secondary | ICD-10-CM | POA: Diagnosis present

## 2018-07-04 LAB — URINALYSIS, ROUTINE W REFLEX MICROSCOPIC
BILIRUBIN URINE: NEGATIVE
Bacteria, UA: NONE SEEN
Glucose, UA: NEGATIVE mg/dL
Hgb urine dipstick: NEGATIVE
Ketones, ur: NEGATIVE mg/dL
Nitrite: NEGATIVE
PH: 6 (ref 5.0–8.0)
Protein, ur: NEGATIVE mg/dL
SPECIFIC GRAVITY, URINE: 1.015 (ref 1.005–1.030)

## 2018-07-04 LAB — COMPREHENSIVE METABOLIC PANEL
ALBUMIN: 4 g/dL (ref 3.5–5.0)
ALK PHOS: 47 U/L (ref 38–126)
ALT: 21 U/L (ref 0–44)
AST: 19 U/L (ref 15–41)
Anion gap: 10 (ref 5–15)
BUN: 17 mg/dL (ref 8–23)
CO2: 30 mmol/L (ref 22–32)
Calcium: 9.5 mg/dL (ref 8.9–10.3)
Chloride: 97 mmol/L — ABNORMAL LOW (ref 98–111)
Creatinine, Ser: 1.04 mg/dL — ABNORMAL HIGH (ref 0.44–1.00)
GFR calc Af Amer: 59 mL/min — ABNORMAL LOW (ref >60)
GFR, EST NON AFRICAN AMERICAN: 50 mL/min — AB (ref >60)
GLUCOSE: 98 mg/dL (ref 70–99)
POTASSIUM: 3 mmol/L — AB (ref 3.5–5.1)
Sodium: 137 mmol/L (ref 135–145)
TOTAL PROTEIN: 7 g/dL (ref 6.5–8.1)
Total Bilirubin: 0.7 mg/dL (ref 0.3–1.2)

## 2018-07-04 LAB — CBC WITH DIFFERENTIAL/PLATELET
BASOS ABS: 0 10*3/uL (ref 0.0–0.1)
BASOS PCT: 0 %
EOS PCT: 0 %
Eosinophils Absolute: 0 10*3/uL (ref 0.0–0.7)
HCT: 38.6 % (ref 36.0–46.0)
Hemoglobin: 12.6 g/dL (ref 12.0–15.0)
Lymphocytes Relative: 16 %
Lymphs Abs: 1.9 10*3/uL (ref 0.7–4.0)
MCH: 31.5 pg (ref 26.0–34.0)
MCHC: 32.6 g/dL (ref 30.0–36.0)
MCV: 96.5 fL (ref 78.0–100.0)
MONO ABS: 1 10*3/uL (ref 0.1–1.0)
Monocytes Relative: 8 %
NEUTROS PCT: 76 %
Neutro Abs: 8.8 10*3/uL — ABNORMAL HIGH (ref 1.7–7.7)
PLATELETS: 185 10*3/uL (ref 150–400)
RBC: 4 MIL/uL (ref 3.87–5.11)
RDW: 14.9 % (ref 11.5–15.5)
WBC: 11.7 10*3/uL — AB (ref 4.0–10.5)

## 2018-07-04 MED ORDER — IOPAMIDOL (ISOVUE-300) INJECTION 61%
100.0000 mL | Freq: Once | INTRAVENOUS | Status: AC | PRN
  Administered 2018-07-04: 100 mL via INTRAVENOUS

## 2018-07-04 MED ORDER — CIPROFLOXACIN IN D5W 400 MG/200ML IV SOLN
400.0000 mg | Freq: Once | INTRAVENOUS | Status: AC
  Administered 2018-07-04: 400 mg via INTRAVENOUS
  Filled 2018-07-04: qty 200

## 2018-07-04 MED ORDER — METRONIDAZOLE IN NACL 5-0.79 MG/ML-% IV SOLN
500.0000 mg | Freq: Once | INTRAVENOUS | Status: AC
  Administered 2018-07-04: 500 mg via INTRAVENOUS
  Filled 2018-07-04: qty 100

## 2018-07-04 MED ORDER — SODIUM CHLORIDE 0.9 % IV BOLUS
1000.0000 mL | Freq: Once | INTRAVENOUS | Status: AC
  Administered 2018-07-04: 1000 mL via INTRAVENOUS

## 2018-07-04 MED ORDER — SODIUM CHLORIDE 0.9 % IV SOLN
INTRAVENOUS | Status: DC | PRN
  Administered 2018-07-04: 250 mL via INTRAVENOUS

## 2018-07-04 MED ORDER — METRONIDAZOLE 500 MG PO TABS: 500.0000 mg | ORAL_TABLET | Freq: Two times a day (BID) | ORAL | 0 refills | Status: DC

## 2018-07-04 MED ORDER — TRAMADOL HCL 50 MG PO TABS: 50.0000 mg | ORAL_TABLET | Freq: Four times a day (QID) | ORAL | 0 refills | Status: DC | PRN

## 2018-07-04 MED ORDER — CIPROFLOXACIN HCL 500 MG PO TABS: 500.0000 mg | ORAL_TABLET | Freq: Two times a day (BID) | ORAL | 0 refills | Status: DC

## 2018-07-04 MED ORDER — SODIUM CHLORIDE 0.9 % IV BOLUS (SEPSIS)
500.0000 mL | Freq: Once | INTRAVENOUS | Status: AC
  Administered 2018-07-04: 500 mL via INTRAVENOUS

## 2018-07-04 NOTE — ED Provider Notes (Signed)
[facility] EMERGENCY DEPARTMENT Provider Note   CSN: [id number] Arrival date & time: 07/04/18  [id number]     History   Chief Complaint Chief Complaint  Patient presents with  . Abdominal Pain    HPI Monique Wagner is a 77 y.o. female.  Patient complains of lower abdominal pain.  She has had diverticulitis before  The history is provided by the patient. No language interpreter was used.  Abdominal Pain   This is a new problem. The current episode started more than 2 days ago. The problem occurs constantly. The problem has not changed since onset.The pain is associated with an unknown factor. The pain is located in the LLQ and RLQ. The pain is at a severity of 4/10. The pain is moderate. Associated symptoms include flatus. Pertinent negatives include diarrhea, frequency, hematuria and headaches.    Past Medical History:  Diagnosis Date  . Arthritis   . Diverticulitis   . HTN (hypertension)   . Hyperlipidemia     Patient Active Problem List   Diagnosis Date Noted  . Anemia   . Lower abdominal pain   . Diverticulitis 08/28/2017  . Pain in the chest   . Chest pain 09/03/2014  . HTN (hypertension) 09/03/2014  . Hyperlipidemia 09/03/2014  . Pain in joint, shoulder region 07/10/2012  . 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  . Osteoarthrosis, unspecified whether generalized or localized, lower leg 04/21/2008  . KNEE PAIN 04/21/2008  . HIGH BLOOD PRESSURE 04/20/2008    Past Surgical History:  Procedure Laterality Date  . ABDOMINAL HYSTERECTOMY    . BREAST SURGERY       OB History   None      Home Medications    Prior to Admission medications   Medication Sig Start Date End Date Taking? Authorizing Provider  acetaminophen (TYLENOL) 325 MG tablet Take 650 mg by mouth every morning.    Yes [provider]  CALCIUM PO Take 1 tablet by mouth daily.   Yes [provider]  carvedilol (COREG CR) 20 MG 24 hr capsule Take 20 mg by mouth daily.   Yes  [provider]  COD LIVER OIL PO Take 1 capsule by mouth daily.   Yes [provider]  folic acid (FOLVITE) 1 MG tablet Take 1 mg by mouth daily. 07/18/16  Yes [provider]  methotrexate (RHEUMATREX) 2.5 MG tablet Take 10 mg by mouth 2 (two) times a week. Take on Saturday and Sunday. 07/21/16  Yes [provider]  Multiple Vitamins-Minerals (MULTIVITAMIN PO) Take 1 tablet by mouth daily.    Yes [provider]  Omega-3 Fatty Acids (FISH OIL PO) Take 1 capsule by mouth daily.    Yes [provider]  OVER THE COUNTER MEDICATION Take 1 tablet by mouth daily. Intestinal Sooth and Build   Yes [provider]  OVER THE COUNTER MEDICATION Take 1 capsule by mouth daily. Ultimate Flora Probiotic   Yes [provider]  predniSONE (DELTASONE) 10 MG tablet Take 10 mg by mouth daily as needed (for knee pain).   Yes [provider]  telmisartan-hydrochlorothiazide (MICARDIS HCT) 40-12.5 MG tablet Take 1 tablet by mouth every other day.   Yes [provider]  triamterene-hydrochlorothiazide (MAXZIDE-25) 37.5-25 MG tablet Take 1 tablet by mouth daily. 06/17/16  Yes [provider]  ciprofloxacin (CIPRO) 500 MG tablet Take 1 tablet (500 mg total) by mouth 2 (two) times daily. One po bid x 7 days 07/04/18   Monique Berkshire,  MD  metroNIDAZOLE (FLAGYL) 500 MG tablet Take 1 tablet (500 mg total) by mouth 2 (two) times daily. One po bid x 7 days 07/04/18   Monique Berkshire, MD  traMADol (ULTRAM) 50 MG tablet Take 1 tablet (50 mg total) by mouth every 6 (six) hours as needed. 07/04/18   Monique Berkshire, MD    Family History Family History  Problem Relation Age of Onset  . Arthritis Unknown   . Cancer Unknown   . Diabetes Unknown   . Heart disease Other     Social History Social History   Tobacco Use  . Smoking status: Never Smoker  . Smokeless tobacco: Never Used  Substance Use Topics  . Alcohol use: No  . Drug  use: No     Allergies   Patient has no known allergies.   Review of Systems Review of Systems  Constitutional: Negative for appetite change and fatigue.  HENT: Negative for congestion, ear discharge and sinus pressure.   Eyes: Negative for discharge.  Respiratory: Negative for cough.   Cardiovascular: Negative for chest pain.  Gastrointestinal: Positive for abdominal pain and flatus. Negative for diarrhea.  Genitourinary: Negative for frequency and hematuria.  Musculoskeletal: Negative for back pain.  Skin: Negative for rash.  Neurological: Negative for seizures and headaches.  Psychiatric/Behavioral: Negative for hallucinations.     Physical Exam Updated Vital Signs BP (!) 92/55   Pulse 69   Temp 98 F (36.7 C) (Oral)   Resp 18   Ht 5\' 4"  (1.626 m)   Wt 71.2 kg   SpO2 (!) 87%   BMI 26.95 kg/m   Physical Exam  Constitutional: She is oriented to person, place, and time. She appears well-developed.  HENT:  Head: Normocephalic.  Eyes: Conjunctivae and EOM are normal. No scleral icterus.  Neck: Neck supple. No thyromegaly present.  Cardiovascular: Normal rate and regular rhythm. Exam reveals no gallop and no friction rub.  No murmur heard. Pulmonary/Chest: No stridor. She has no wheezes. She has no rales. She exhibits no tenderness.  Abdominal: She exhibits no distension. There is tenderness. There is no rebound.  Musculoskeletal: Normal range of motion. She exhibits no edema.  Lymphadenopathy:    She has no cervical adenopathy.  Neurological: She is oriented to person, place, and time. She exhibits normal muscle tone. Coordination normal.  Skin: No rash noted. No erythema.  Psychiatric: She has a normal mood and affect. Her behavior is normal.     ED Treatments / Results  Labs (all labs ordered are listed, but only abnormal results are displayed) Labs Reviewed  COMPREHENSIVE METABOLIC PANEL - Abnormal; Notable for the following components:      Result Value     Potassium 3.0 (*)    Chloride 97 (*)    Creatinine, Ser 1.04 (*)    GFR calc non Af Amer 50 (*)    GFR calc Af Amer 59 (*)    All other components within normal limits  URINALYSIS, ROUTINE W REFLEX MICROSCOPIC - Abnormal; Notable for the following components:   Color, Urine AMBER (*)    APPearance HAZY (*)    Leukocytes, UA TRACE (*)    All other components within normal limits  CBC WITH DIFFERENTIAL/PLATELET - Abnormal; Notable for the following components:   WBC 11.7 (*)    Neutro Abs 8.8 (*)    All other components within normal limits    EKG None  Radiology Ct Abdomen Pelvis W Contrast  Result Date: 07/04/2018 CLINICAL DATA:  Lower abdominal pain with nausea and vomiting EXAM: CT ABDOMEN AND PELVIS WITH CONTRAST TECHNIQUE: Multidetector CT imaging of the abdomen and pelvis was performed using the standard protocol following bolus administration of intravenous contrast. CONTRAST:  100 mL ISOVUE-300 IOPAMIDOL (ISOVUE-300) INJECTION 61% COMPARISON:  April 07, 2018 FINDINGS: Lower chest: Slight posterior right base subpleural nodularity remains. There is no edema or consolidation in the lung bases. Hepatobiliary: Several apparent cysts are noted in the liver, also present previously. There are several areas felt to represent hemangiomas bilaterally, stable. There is a hemangioma in the left lobe of the liver near the junction with the upper stomach measuring 2.7 x 1.9 cm. There is an apparent hemangioma near the dome of the liver medially on the right measuring 4.7 by 2.1 cm. There is a uniformly enhancing presumed hemangioma in the posterior segment of the right lobe of the liver inferiorly measuring 1.2 x 1.0 cm. No new liver lesions are evident. Gallbladder wall is not appreciably thickened. There is no biliary duct dilatation. Pancreas: There is no pancreatic mass or inflammatory focus. Spleen: Subcentimeter likely hemangiomas are again noted in the spleen. Spleen otherwise appears  unremarkable. Spleen is normal in size and contour. Adrenals/Urinary Tract: Adrenals bilaterally appear unremarkable. There is a cyst arising from the upper pole of the left kidney medially measuring 1 x 1 cm. A cyst more laterally in the upper left kidney measures 1 x 1 cm. There is a 6 x 5 mm cyst in the upper pole right kidney. There is no hydronephrosis on either side. There is an extrarenal pelvis on each side, an anatomic variant. There is no evident renal or ureteral calculus on either side. Urinary bladder is midline with wall thickness within normal limits. Stomach/Bowel: There remains thickening of the wall of the mid sigmoid colon with mild surrounding mesenteric thickening in several mildly irregular diverticula in this area. This area has not changed appreciably since previous study and may represent chronic diverticulitis. The possibility of an underlying mass with secondary inflammation must be of concern given this appearance. There is soft tissue thickening lateral to this area of colon wall thickening, stable. Elsewhere, there is no appreciable bowel wall or mesenteric thickening. No evident bowel obstruction. No free air or portal venous air. Vascular/Lymphatic: There are foci of aortic and iliac artery atherosclerosis. No aneurysm evident. Major mesenteric arterial vessels appear patent. There is no adenopathy in the abdomen or pelvis. Reproductive: Uterus is absent.  There is no evident pelvic mass. Other: Appendix not appreciable. No periappendiceal region inflammation. There is no abscess or ascites in the abdomen or pelvis. There is a rather minimal ventral hernia containing only fat. There is fat in each inguinal ring. Musculoskeletal: There is slight anterolisthesis of L4 on L5. There are no blastic or lytic bone lesions. There is degenerative change at multiple sites in the lumbar region. There is no intramuscular lesion. IMPRESSION: 1. Persistent wall thickening in the mid sigmoid colon  with adjacent mesenteric thickening. Question chronic diverticulitis. Possibility of an underlying mass causing inflammation must be of concern given the persistence of this finding. Direct visualization of this area is felt to be warranted if direct visualization of this area has not been performed recently. 2. No evident bowel obstruction. No abscess in the abdomen or pelvis. No periappendiceal region inflammation evident. Appendix not seen. 3. Stable liver hemangiomas. Probable subcentimeter splenic hemangiomas as well. 4.  No evident renal or ureteral calculus.  No hydronephrosis. 5.  Aortoiliac atherosclerosis. 6.  Uterus absent. 7. Rather minimal ventral hernia containing only fat. Fat noted in each inguinal ring. Aortic Atherosclerosis (ICD10-I70.0). Electronically Signed   By: Bretta Bang III M.D.   On: 07/04/2018 10:33    Procedures Procedures (including critical care time)  Medications Ordered in ED Medications  metroNIDAZOLE (FLAGYL) IVPB 500 mg (500 mg Intravenous New Bag/Given 07/04/18 1525)  0.9 %  sodium chloride infusion (250 mLs Intravenous New Bag/Given 07/04/18 1404)  sodium chloride 0.9 % bolus 1,000 mL (1,000 mLs Intravenous New Bag/Given 07/04/18 1446)  sodium chloride 0.9 % bolus 500 mL (0 mLs Intravenous Stopped 07/04/18 1054)  iopamidol (ISOVUE-300) 61 % injection 100 mL (100 mLs Intravenous Contrast Given 07/04/18 1008)  ciprofloxacin (CIPRO) IVPB 400 mg ( Intravenous Stopped 07/04/18 1505)     Initial Impression / Assessment and Plan / ED Course  I have reviewed the triage vital signs and the nursing notes.  Pertinent labs & imaging results that were available during my care of the patient were reviewed by me and considered in my medical decision making (see chart for details).    CT scan shows diverticulitis.  Patient will be sent home with antibiotics.  She is nontoxic.  Patient's blood pressure has been mildly low and we will stop 1 of her blood pressure  medicines  Final Clinical Impressions(s) / ED Diagnoses   Final diagnoses:  Diverticulitis    ED Discharge Orders         Ordered    ciprofloxacin (CIPRO) 500 MG tablet  2 times daily     07/04/18 1452    metroNIDAZOLE (FLAGYL) 500 MG tablet  2 times daily     07/04/18 1452    traMADol (ULTRAM) 50 MG tablet  Every 6 hours PRN     07/04/18 1452           Monique Berkshire, MD 07/04/18 1544

## 2018-07-04 NOTE — ED Triage Notes (Signed)
Pt c/o of lower abdominal pain x 2 days with n/v starting last night. HX of diverticulitis

## 2018-07-04 NOTE — Discharge Instructions (Signed)
Stop taking your Maxide-25 blood pressure medicine.  Follow-up with your doctor next week for recheck of your blood pressure and follow-up with your stomach doctor next week for your diverticulitis

## 2018-07-31 ENCOUNTER — Ambulatory Visit (INDEPENDENT_AMBULATORY_CARE_PROVIDER_SITE_OTHER): Payer: Medicare Other | Admitting: Otolaryngology

## 2018-07-31 DIAGNOSIS — H9201 Otalgia, right ear: Secondary | ICD-10-CM | POA: Diagnosis not present

## 2018-07-31 DIAGNOSIS — H903 Sensorineural hearing loss, bilateral: Secondary | ICD-10-CM | POA: Diagnosis not present

## 2018-11-04 IMAGING — CT CT ABD-PELV W/ CM
2 of 8 series · 15 of 46 positions shown, 17 images · IV contrast (APPLIED)
Comparison: None.

CLINICAL DATA: Patient with lower pelvic pain for 1 week, worsening
at night.

EXAM:
CT ABDOMEN AND PELVIS WITH CONTRAST
TECHNIQUE: Multidetector CT imaging of the abdomen and pelvis was performed
using the standard protocol following bolus administration of
intravenous contrast.
CONTRAST:  30mL XGM2JW-CHH IOPAMIDOL (XGM2JW-CHH) INJECTION 61%,
100mL XGM2JW-CHH IOPAMIDOL (XGM2JW-CHH) INJECTION 61%

[Series 2: axial st · axial · 0.75mm/px · z∈[+1303,+1658]mm · 12 of 83 slices shown, 14 images]
[im 6/83  soft-tissue]
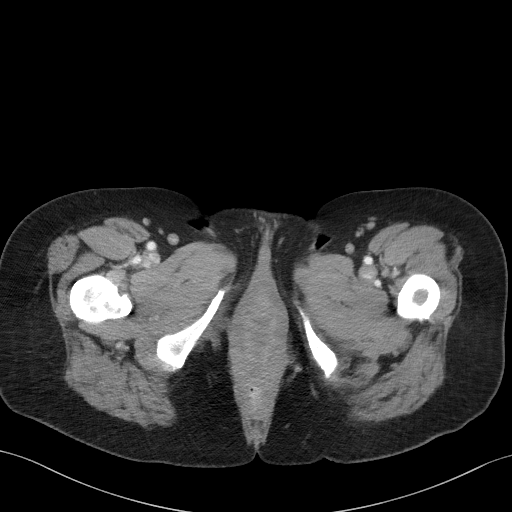
[im 6/83  bone]
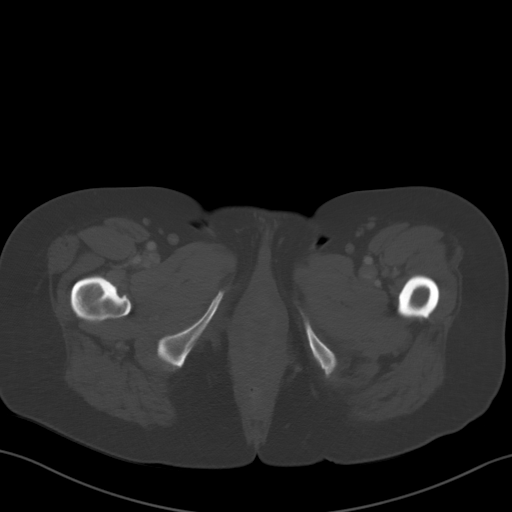
[im 11/83  soft-tissue]
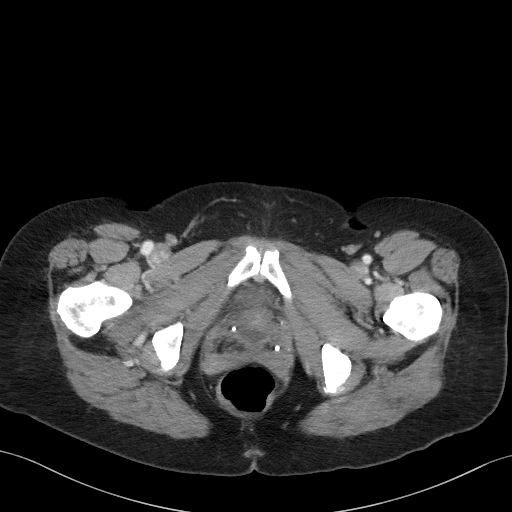
[im 17/83  soft-tissue]
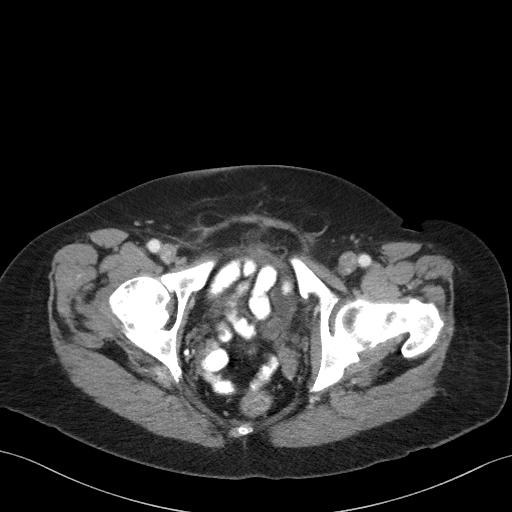
[im 28/83  soft-tissue]
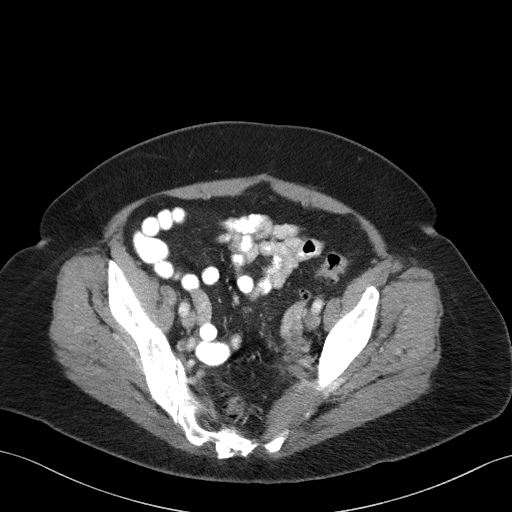
[im 33/83  soft-tissue]
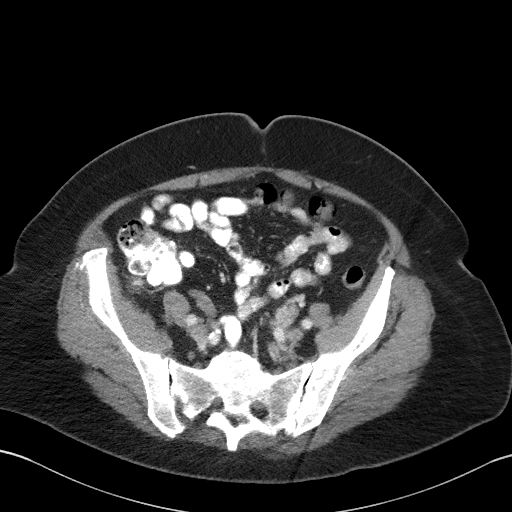
[im 39/83  soft-tissue]
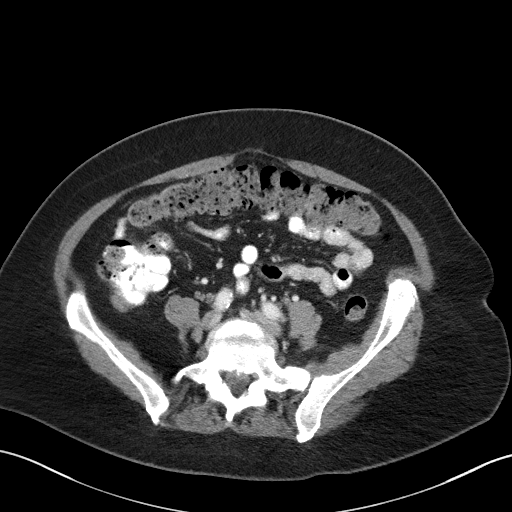
[im 44/83  soft-tissue]
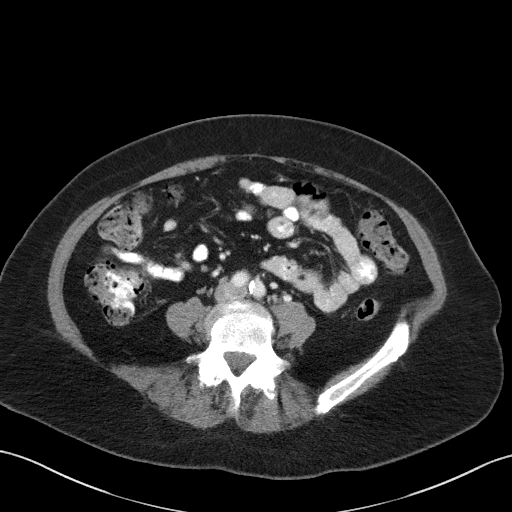
[im 50/83  soft-tissue]
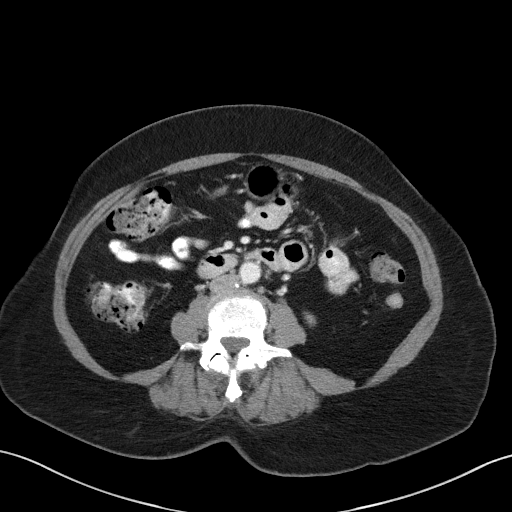
[im 55/83  soft-tissue]
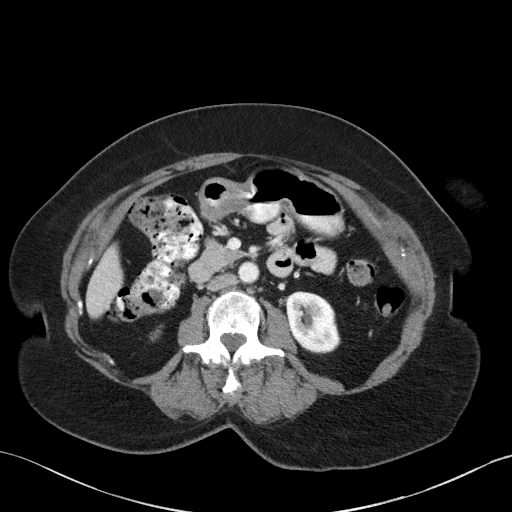
[im 55/83  bone]
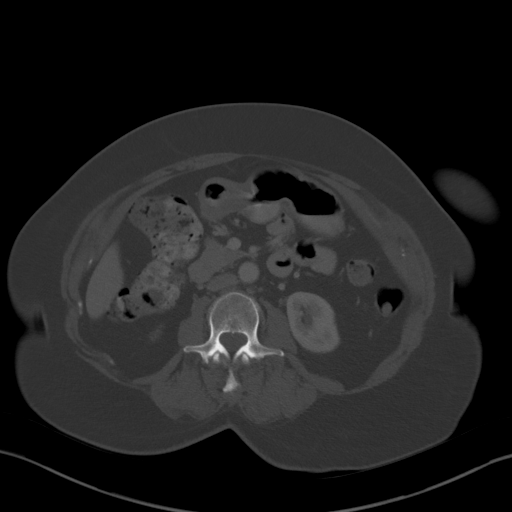
[im 66/83  soft-tissue]
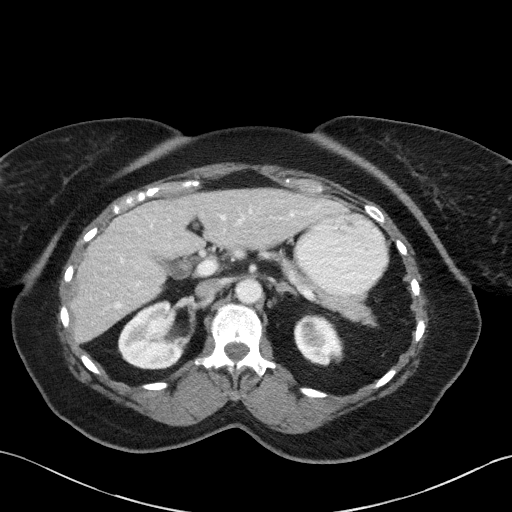
[im 72/83  soft-tissue]
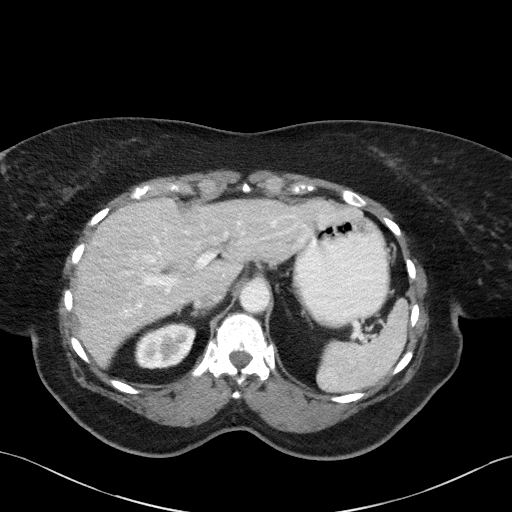
[im 77/83  soft-tissue]
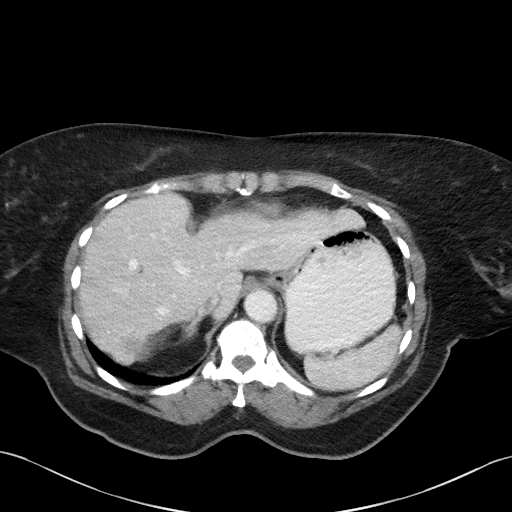

[Series 7: coronal st · coronal · 0.72mm/px · 3 of 95 slices shown]
[im 24/95  soft-tissue]
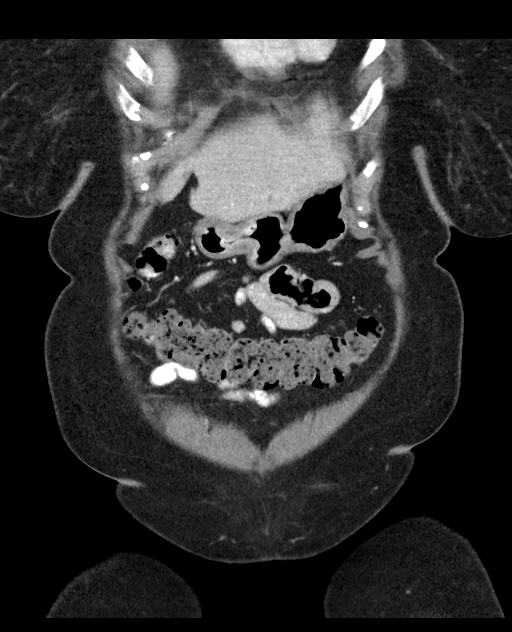
[im 48/95  soft-tissue]
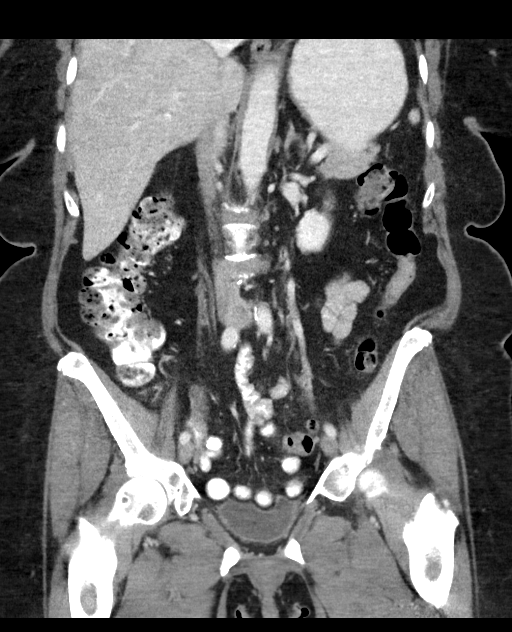
[im 71/95  soft-tissue]
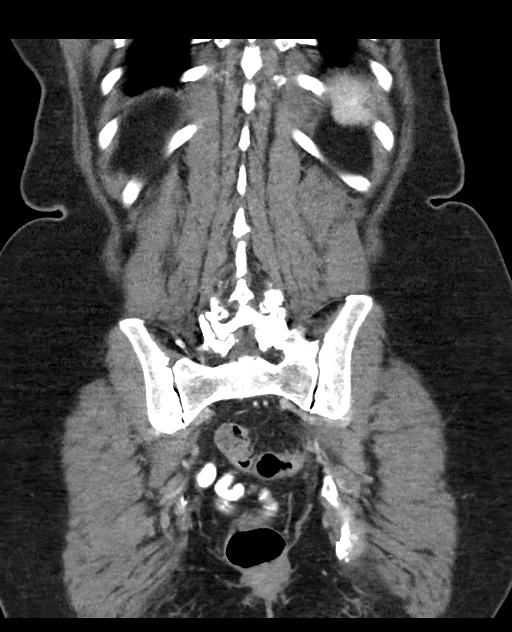

[15 of 46 positions shown; findings below may reference images not displayed]

FINDINGS: Lower chest: Visualized lung bases are clear.

Hepatobiliary: Within the lateral left hepatic lobe there is a 2.8 x
2.2 cm peripheral enhancing mass (image 10; series 2), most
compatible with hemangioma. There is an indeterminate 11 mm
enhancing mass within the right hepatic lobe (image 21; series 2).
There is a 1.5 cm peripheral enhancing mass within the right hepatic
lobe (image 15; series 2), potentially representing hemangioma.
Additional flash filling mass within the hepatic dome measuring
cm (image 3; series 2). Additionally along the posterior aspect of
the right hepatic lobe/hepatic dome there is a 4.8 x 2.7 cm
low-attenuation lobular mass (image 5; series 2). Gallbladder is
unremarkable. No intrahepatic or extrahepatic biliary ductal
dilatation.

Pancreas: Unremarkable

Spleen: Unremarkable

Adrenals/Urinary Tract: Normal adrenal glands. Kidneys enhance
symmetrically with contrast. Too small to characterize sub cm
low-attenuation lesions within the left kidney.

Stomach/Bowel: There is focal wall thickening of the sigmoid colon
(image 61; series 2) with surrounding fat stranding (image 60;
series 7). No evidence for upstream bowel obstruction. Normal
morphology of the stomach.

Vascular/Lymphatic: Normal caliber abdominal aorta. Peripheral
calcified atherosclerotic plaque. No retroperitoneal
lymphadenopathy.

Reproductive: Patient status post hysterectomy. Adnexal structures
are unremarkable.

Other: Bilateral fat containing inguinal hernias.

Musculoskeletal: Lower thoracic and lumbar spine degenerative
changes. No aggressive or acute appearing osseous lesions.
IMPRESSION: Eccentric wall thickening of the sigmoid colon with surrounding fat
stranding concerning for diverticulitis/focal colitis. After
resolution of the acute symptomatology, if not previously performed,
recommend correlation with colonoscopy to exclude the possibility of
underlying colonic mass.

Multiple liver lesions as described above. Many of these lesions are
favored to represent hemangiomas however there is an indeterminate
4.8 cm lobular low-attenuation lesion within the hepatic dome. This
needs dedicated evaluation/characterization with hepatic MRI.

Aortic atherosclerosis.

## 2019-04-24 ENCOUNTER — Other Ambulatory Visit (HOSPITAL_COMMUNITY): Payer: Self-pay | Admitting: Family Medicine

## 2019-04-24 DIAGNOSIS — Z1231 Encounter for screening mammogram for malignant neoplasm of breast: Secondary | ICD-10-CM

## 2019-06-03 ENCOUNTER — Other Ambulatory Visit: Payer: Self-pay

## 2019-06-03 ENCOUNTER — Ambulatory Visit (HOSPITAL_COMMUNITY): Payer: Medicare Other

## 2019-06-03 ENCOUNTER — Ambulatory Visit (HOSPITAL_COMMUNITY)
Admission: RE | Admit: 2019-06-03 | Discharge: 2019-06-03 | Disposition: A | Discharge status: Home / Self Care | Payer: Medicare Other | Source: Ambulatory Visit | Attending: Family Medicine | Admitting: Family Medicine

## 2019-06-03 DIAGNOSIS — Z1231 Encounter for screening mammogram for malignant neoplasm of breast: Secondary | ICD-10-CM | POA: Diagnosis present

## 2019-06-10 ENCOUNTER — Ambulatory Visit (HOSPITAL_COMMUNITY): Payer: Medicare Other

## 2020-04-21 ENCOUNTER — Other Ambulatory Visit (HOSPITAL_COMMUNITY): Payer: Self-pay | Admitting: Family Medicine

## 2020-04-21 DIAGNOSIS — Z1231 Encounter for screening mammogram for malignant neoplasm of breast: Secondary | ICD-10-CM

## 2020-05-09 ENCOUNTER — Ambulatory Visit: Payer: Self-pay | Admitting: Student

## 2020-05-09 NOTE — H&P (View-Only) (Signed)
TOTAL KNEE ADMISSION H&P ° °Patient is being admitted for left total knee arthroplasty. ° °Subjective: ° °Chief Complaint:left knee pain. ° °HPI: Monique Wagner, 79 y.o. female, has a history of pain and functional disability in the left knee due to arthritis and has failed non-surgical conservative treatments for greater than 12 weeks to includeNSAID's and/or analgesics, corticosteriod injections and activity modification.  Onset of symptoms was gradual, starting 2 years ago with gradually worsening course since that time. The patient noted no past surgery on the left knee(s).  Patient currently rates pain in the left knee(s) at 8 out of 10 with activity. Patient has worsening of pain with activity and weight bearing, pain that interferes with activities of daily living and pain with passive range of motion.  Patient has evidence of subchondral cysts and joint space narrowing by imaging studies. There is no active infection. ° °Patient Active Problem List  ° Diagnosis Date Noted  °• Anemia   °• Lower abdominal pain   °• Diverticulitis 08/28/2017  °• Pain in the chest   °• Chest pain 09/03/2014  °• HTN (hypertension) 09/03/2014  °• Hyperlipidemia 09/03/2014  °• Pain in joint, shoulder region 07/10/2012  °• 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  °• Osteoarthrosis, unspecified whether generalized or localized, lower leg 04/21/2008  °• KNEE PAIN 04/21/2008  °• HIGH BLOOD PRESSURE 04/20/2008  ° °Past Medical History:  °Diagnosis Date  °• Arthritis   °• Diverticulitis   °• HTN (hypertension)   °• Hyperlipidemia   °  °Past Surgical History:  °Procedure Laterality Date  °• ABDOMINAL HYSTERECTOMY    °• BREAST SURGERY    °  °Current Outpatient Medications  °Medication Sig Dispense Refill Last Dose  °• acetaminophen (TYLENOL) 325 MG tablet Take 650 mg by mouth every morning.      °• CALCIUM PO Take 1 tablet by mouth daily.     °• carvedilol (COREG CR) 20 MG 24 hr capsule Take 20 mg by mouth daily.     °• ciprofloxacin  (CIPRO) 500 MG tablet Take 1 tablet (500 mg total) by mouth 2 (two) times daily. One po bid x 7 days 14 tablet 0   °• COD LIVER OIL PO Take 1 capsule by mouth daily.     °• folic acid (FOLVITE) 1 MG tablet Take 1 mg by mouth daily.  3   °• methotrexate (RHEUMATREX) 2.5 MG tablet Take 10 mg by mouth 2 (two) times a week. Take on Saturday and Sunday.  2   °• metroNIDAZOLE (FLAGYL) 500 MG tablet Take 1 tablet (500 mg total) by mouth 2 (two) times daily. One po bid x 7 days 14 tablet 0   °• Multiple Vitamins-Minerals (MULTIVITAMIN PO) Take 1 tablet by mouth daily.      °• Omega-3 Fatty Acids (FISH OIL PO) Take 1 capsule by mouth daily.      °• OVER THE COUNTER MEDICATION Take 1 tablet by mouth daily. Intestinal Sooth and Build     °• OVER THE COUNTER MEDICATION Take 1 capsule by mouth daily. Ultimate Flora Probiotic     °• predniSONE (DELTASONE) 10 MG tablet Take 10 mg by mouth daily as needed (for knee pain).     °• telmisartan-hydrochlorothiazide (MICARDIS HCT) 40-12.5 MG tablet Take 1 tablet by mouth every other day.     °• traMADol (ULTRAM) 50 MG tablet Take 1 tablet (50 mg total) by mouth every 6 (six) hours as needed. 20 tablet 0   °• triamterene-hydrochlorothiazide (MAXZIDE-25) 37.5-25   MG tablet Take 1 tablet by mouth daily.  1   ° °No current facility-administered medications for this visit.  ° °No Known Allergies  °Social History  ° °Tobacco Use  °• Smoking status: Never Smoker  °• Smokeless tobacco: Never Used  °Substance Use Topics  °• Alcohol use: No  °  °Family History  °Problem Relation Age of Onset  °• Arthritis Unknown   °• Cancer Unknown   °• Diabetes Unknown   °• Heart disease Other   °  ° °Review of Systems  °Constitutional: Negative.   °HENT: Negative.   °Eyes: Negative.   °Respiratory: Negative.   °Cardiovascular: Negative.   °Gastrointestinal: Negative.   °Endocrine: Negative.   °Genitourinary: Negative.   °Musculoskeletal: Positive for arthralgias.  °Skin: Negative.   °Allergic/Immunologic:  Negative.   °Neurological: Negative.   °Hematological: Negative.   °Psychiatric/Behavioral: Negative.   ° ° °Objective: ° °Physical Exam °Constitutional:   °   Appearance: Normal appearance.  °HENT:  °   Head: Normocephalic and atraumatic.  °Eyes:  °   Extraocular Movements: Extraocular movements intact.  °   Pupils: Pupils are equal, round, and reactive to light.  °Cardiovascular:  °   Rate and Rhythm: Normal rate and regular rhythm.  °   Heart sounds: Normal heart sounds. No murmur heard.  °No friction rub. No gallop.   °Pulmonary:  °   Breath sounds: Normal breath sounds. No wheezing, rhonchi or rales.  °Abdominal:  °   Palpations: Abdomen is soft.  °   Tenderness: There is no abdominal tenderness.  °Genitourinary: °   Comments: Deferred °Musculoskeletal:  °   Cervical back: Normal range of motion and neck supple.  °   Comments: Examination of bilateral knees reveals no skin was her lesions. She does have varus deformities. She has swelling, trace effusion. No warmth or erythema. She has tenderness to palpation medial joint line, lateral joint line, peripatellar retinacular tissues with positive grind sign. Range of motion of the right knee is 15-95° without instability. Range of motion of the left knee is 15-100° without instability. Painless range of motion of the hips.  °Skin: °   General: Skin is warm and dry.  °Neurological:  °   General: No focal deficit present.  °   Mental Status: She is alert and oriented to person, place, and time.  °Psychiatric:     °   Mood and Affect: Mood normal.  ° ° ° °Vital signs in last 24 hours: °@VSRANGES@ ° °Labs: ° ° °Estimated body mass index is 26.95 kg/m² as calculated from the following: °  Height as of 07/04/18: 5' 4" (1.626 m). °  Weight as of 07/04/18: 71.2 kg. ° ° °Imaging Review °Plain radiographs demonstrate severe degenerative joint disease of the left knee(s). The overall alignment isneutral. The bone quality appears to be adequate for age and reported activity  level. ° ° ° ° ° °Assessment/Plan: ° °End stage arthritis, left knee  ° °The patient history, physical examination, clinical judgment of the provider and imaging studies are consistent with end stage degenerative joint disease of the left knee(s) and total knee arthroplasty is deemed medically necessary. The treatment options including medical management, injection therapy arthroscopy and arthroplasty were discussed at length. The risks and benefits of total knee arthroplasty were presented and reviewed. The risks due to aseptic loosening, infection, stiffness, patella tracking problems, thromboembolic complications and other imponderables were discussed. The patient acknowledged the explanation, agreed to proceed with the plan and   consent was signed. Patient is being admitted for inpatient treatment for surgery, pain control, PT, OT, prophylactic antibiotics, VTE prophylaxis, progressive ambulation and ADL's and discharge planning. The patient is planning to be discharged home after an overnight stay and will complete outpatient physical therapy ° ° ° ° °Patient's anticipated LOS is less than 2 midnights, meeting these requirements: ° °- Lives within 1 hour of care °- Has a competent adult at home to recover with post-op recover °- NO history of ° - Chronic pain requiring opiods ° - Diabetes ° - Coronary Artery Disease ° - Heart failure ° - Heart attack ° - Stroke ° - DVT/VTE ° - Cardiac arrhythmia ° - Respiratory Failure/COPD ° - Renal failure ° - Anemia ° - Advanced Liver disease ° ° ° ° °

## 2020-05-09 NOTE — H&P (Signed)
TOTAL KNEE ADMISSION H&P  Patient is being admitted for left total knee arthroplasty.  Subjective:  Chief Complaint:left knee pain.  HPI: Monique MAESTRE, 79 y.o. female, has a history of pain and functional disability in the left knee due to arthritis and has failed non-surgical conservative treatments for greater than 12 weeks to includeNSAID's and/or analgesics, corticosteriod injections and activity modification.  Onset of symptoms was gradual, starting 2 years ago with gradually worsening course since that time. The patient noted no past surgery on the left knee(s).  Patient currently rates pain in the left knee(s) at 8 out of 10 with activity. Patient has worsening of pain with activity and weight bearing, pain that interferes with activities of daily living and pain with passive range of motion.  Patient has evidence of subchondral cysts and joint space narrowing by imaging studies. There is no active infection.  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       CALCIUM PO Take 1 tablet by mouth daily.      carvedilol (COREG CR) 20 MG 24 hr capsule Take 20 mg by mouth daily.      ciprofloxacin  (CIPRO) 500 MG tablet Take 1 tablet (500 mg total) by mouth 2 (two) times daily. One po bid x 7 days 14 tablet 0    COD LIVER OIL PO Take 1 capsule by mouth daily.      folic acid (FOLVITE) 1 MG tablet Take 1 mg by mouth daily.  3    methotrexate (RHEUMATREX) 2.5 MG tablet Take 10 mg by mouth 2 (two) times a week. Take on Saturday and Sunday.  2    metroNIDAZOLE (FLAGYL) 500 MG tablet Take 1 tablet (500 mg total) by mouth 2 (two) times daily. One po bid x 7 days 14 tablet 0    Multiple Vitamins-Minerals (MULTIVITAMIN PO) Take 1 tablet by mouth daily.       Omega-3 Fatty Acids (FISH OIL PO) Take 1 capsule by mouth daily.       OVER THE COUNTER MEDICATION Take 1 tablet by mouth daily. Intestinal Sooth and Build      OVER THE COUNTER MEDICATION Take 1 capsule by mouth daily. Ultimate Flora Probiotic      predniSONE (DELTASONE) 10 MG tablet Take 10 mg by mouth daily as needed (for knee pain).      telmisartan-hydrochlorothiazide (MICARDIS HCT) 40-12.5 MG tablet Take 1 tablet by mouth every other day.      traMADol (ULTRAM) 50 MG tablet Take 1 tablet (50 mg total) by mouth every 6 (six) hours as needed. 20 tablet 0    triamterene-hydrochlorothiazide (MAXZIDE-25) 37.5-25  MG tablet Take 1 tablet by mouth daily.  1    No current facility-administered medications for this visit.   No Known Allergies  Social History   Tobacco Use   Smoking status: Never Smoker   Smokeless tobacco: Never Used  Substance Use Topics   Alcohol use: No    Family History  Problem Relation Age of Onset   Arthritis Unknown    Cancer Unknown    Diabetes Unknown    Heart disease Other      Review of Systems  Constitutional: Negative.   HENT: Negative.   Eyes: Negative.   Respiratory: Negative.   Cardiovascular: Negative.   Gastrointestinal: Negative.   Endocrine: Negative.   Genitourinary: Negative.   Musculoskeletal: Positive for arthralgias.  Skin: Negative.   Allergic/Immunologic:  Negative.   Neurological: Negative.   Hematological: Negative.   Psychiatric/Behavioral: Negative.     Objective:  Physical Exam Constitutional:      Appearance: Normal appearance.  HENT:     Head: Normocephalic and atraumatic.  Eyes:     Extraocular Movements: Extraocular movements intact.     Pupils: Pupils are equal, round, and reactive to light.  Cardiovascular:     Rate and Rhythm: Normal rate and regular rhythm.     Heart sounds: Normal heart sounds. No murmur heard.  No friction rub. No gallop.   Pulmonary:     Breath sounds: Normal breath sounds. No wheezing, rhonchi or rales.  Abdominal:     Palpations: Abdomen is soft.     Tenderness: There is no abdominal tenderness.  Genitourinary:    Comments: Deferred Musculoskeletal:     Cervical back: Normal range of motion and neck supple.     Comments: Examination of bilateral knees reveals no skin was her lesions. She does have varus deformities. She has swelling, trace effusion. No warmth or erythema. She has tenderness to palpation medial joint line, lateral joint line, peripatellar retinacular tissues with positive grind sign. Range of motion of the right knee is 15-95 without instability. Range of motion of the left knee is 15-100 without instability. Painless range of motion of the hips.  Skin:    General: Skin is warm and dry.  Neurological:     General: No focal deficit present.     Mental Status: She is alert and oriented to person, place, and time.  Psychiatric:        Mood and Affect: Mood normal.     Vital signs in last 24 hours: @VSRANGES @  Labs:   Estimated body mass index is 26.95 kg/m as calculated from the following:   Height as of 07/04/18: 5\' 4"  (1.626 m).   Weight as of 07/04/18: 71.2 kg.   Imaging Review Plain radiographs demonstrate severe degenerative joint disease of the left knee(s). The overall alignment isneutral. The bone quality appears to be adequate for age and reported activity  level.      Assessment/Plan:  End stage arthritis, left knee   The patient history, physical examination, clinical judgment of the provider and imaging studies are consistent with end stage degenerative joint disease of the left knee(s) and total knee arthroplasty is deemed medically necessary. The treatment options including medical management, injection therapy arthroscopy and arthroplasty were discussed at length. The risks and benefits of total knee arthroplasty were presented and reviewed. The risks due to aseptic loosening, infection, stiffness, patella tracking problems, thromboembolic complications and other imponderables were discussed. The patient acknowledged the explanation, agreed to proceed with the plan and  consent was signed. Patient is being admitted for inpatient treatment for surgery, pain control, PT, OT, prophylactic antibiotics, VTE prophylaxis, progressive ambulation and ADL's and discharge planning. The patient is planning to be discharged home after an overnight stay and will complete outpatient physical therapy     Patient's anticipated LOS is less than 2 midnights, meeting these requirements:  - Lives within 1 hour of care - Has a competent adult at home to recover with post-op recover - NO history of  - Chronic pain requiring opiods  - Diabetes  - Coronary Artery Disease  - Heart failure  - Heart attack  - Stroke  - DVT/VTE  - Cardiac arrhythmia  - Respiratory Failure/COPD  - Renal failure  - Anemia  - Advanced Liver disease

## 2020-05-10 ENCOUNTER — Encounter (HOSPITAL_COMMUNITY): Payer: Self-pay | Admitting: Orthopedic Surgery

## 2020-05-10 NOTE — Progress Notes (Signed)
DUE TO COVID-19 ONLY ONE VISITOR IS ALLOWED TO COME WITH YOU AND STAY IN THE WAITING ROOM ONLY DURING PRE OP AND PROCEDURE DAY OF SURGERY. THE 1 VISITOR  MAY VISIT WITH YOU AFTER SURGERY IN YOUR PRIVATE ROOM DURING VISITING HOURS ONLY!  YOU NEED TO HAVE A COVID 19 TEST ON_______ @_______ , THIS TEST MUST BE DONE BEFORE SURGERY,  COVID TESTING SITE [address] [place] , IT IS ON THE RIGHT GOING OUT [address] APPROXIMATELY  2 MINUTES PAST [employer] ON THE RIGHT. ONCE YOUR COVID TEST IS COMPLETED,  PLEASE BEGIN THE QUARANTINE INSTRUCTIONS AS OUTLINED IN YOUR HANDOUT.                Monique Wagner  05/10/2020   Your procedure is scheduled on:  05/18/2020   Report to [facility] Main  Entrance   Report to admitting at    1000 AM     Call this number if you have problems the morning of surgery [phone]    Remember: Do not eat food   :After Midnight. BRUSH YOUR TEETH MORNING OF SURGERY AND RINSE YOUR MOUTH OUT, NO CHEWING GUM CANDY OR MINTS.  NO SOLID FOOD AFTER MIDNIGHT THE NIGHT PRIOR TO SURGERY. NOTHING BY MOUTH EXCEPT CLEAR LIQUIDS UNTIL    0920am . PLEASE FINISH ENSURE DRINK PER SURGEON ORDER  WHICH NEEDS TO BE COMPLETED AT 0920am .    CLEAR LIQUID DIET   Foods Allowed                                                                     Coffee and tea, regular and decaf                             Plain Jell-O any favor except red or purple                                           Fruit ices (not with fruit pulp)                                      Iced Popsicles                                   Carbonated beverages, regular and diet                                    Cranberry, grape and apple juices Sports drinks like Gatorade Lightly seasoned clear broth or consume(fat free) Sugar, honey syrup  _____________________________________________________________________    Take these medicines the morning of surgery with A SIP OF WATER:     Zetia, Eye drops as usual  DO NOT TAKE ANY DIABETIC MEDICATIONS DAY OF YOUR SURGERY                               You may not have any metal on your body including hair pins and              piercings  Do not wear jewelry, make-up, lotions, powders or perfumes, deodorant             Do not wear nail polish on your fingernails.  Do not shave  48 hours prior to surgery.              Men may shave face and neck.   Do not bring valuables to the hospital. [facility] IS NOT             RESPONSIBLE   FOR VALUABLES.  Contacts, dentures or bridgework may not be worn into surgery.  Leave suitcase in the car. After surgery it may be brought to your room.     Patients discharged the day of surgery will not be allowed to drive home. IF YOU ARE HAVING SURGERY AND GOING HOME THE SAME DAY, YOU MUST HAVE AN ADULT TO DRIVE YOU HOME AND BE WITH YOU FOR 24 HOURS. YOU MAY GO HOME BY TAXI OR UBER OR ORTHERWISE, BUT AN ADULT MUST ACCOMPANY YOU HOME AND STAY WITH YOU FOR 24 HOURS.  Name and phone number of your driver:  Special Instructions: N/A              Please read over the following fact sheets you were given: _____________________________________________________________________  [facility] - Preparing for Surgery Before surgery, you can play an important role.  Because skin is not sterile, your skin needs to be as free of germs as possible.  You can reduce the number of germs on your skin by washing with CHG (chlorahexidine gluconate) soap before surgery.  CHG is an antiseptic cleaner which kills germs and bonds with the skin to continue killing germs even after washing. Please DO NOT use if you have an allergy to CHG or antibacterial soaps.  If your skin becomes reddened/irritated stop using the CHG and inform your nurse when you arrive at  Short Stay. Do not shave (including legs and underarms) for at least 48 hours prior to the first CHG shower.  You may shave your face/neck. Please follow these instructions carefully:  1.  Shower with CHG Soap the night before surgery and the  morning of Surgery.  2.  If you choose to wash your hair, wash your hair first as usual with your  normal  shampoo.  3.  After you shampoo, rinse your hair and body thoroughly to remove the  shampoo.                           4.  Use CHG as you would any other liquid soap.  You can apply chg directly  to the skin and wash                       Gently with a scrungie or clean washcloth.  5.  Apply the CHG Soap to your body ONLY FROM THE NECK DOWN.  Do not use on face/ open                           Wound or open sores. Avoid contact with eyes, ears mouth and genitals (private parts).                       Wash face,  Genitals (private parts) with your normal soap.             6.  Wash thoroughly, paying special attention to the area where your surgery  will be performed.  7.  Thoroughly rinse your body with warm water from the neck down.  8.  DO NOT shower/wash with your normal soap after using and rinsing off  the CHG Soap.                9.  Pat yourself dry with a clean towel.            10.  Wear clean pajamas.            11.  Place clean sheets on your bed the night of your first shower and do not  sleep with pets. Day of Surgery : Do not apply any lotions/deodorants the morning of surgery.  Please wear clean clothes to the hospital/surgery center.  FAILURE TO FOLLOW THESE INSTRUCTIONS MAY RESULT IN THE CANCELLATION OF YOUR SURGERY PATIENT SIGNATURE_________________________________  NURSE SIGNATURE__________________________________  ________________________________________________________________________   Rogelia MireIncentive Spirometer  An incentive spirometer is a tool that can help keep your lungs clear and active. This tool measures how well you are  filling your lungs with each breath. Taking long deep breaths may help reverse or decrease the chance of developing breathing (pulmonary) problems (especially infection) following:  A long period of time when you are unable to move or be active. BEFORE THE PROCEDURE   If the spirometer includes an indicator to show your best effort, your nurse or respiratory therapist will set it to a desired goal.  If possible, sit up straight or lean slightly forward. Try not to slouch.  Hold the incentive spirometer in an upright position. INSTRUCTIONS FOR USE  1. Sit on the edge of your bed if possible, or sit up as far as you can in bed or on a chair. 2. Hold the incentive spirometer in an upright position. 3. Breathe out normally. 4. Place the mouthpiece in your mouth and seal your lips tightly around it. 5. Breathe in slowly and as deeply as possible, raising the piston or the ball toward the top of the column. 6. Hold your breath for 3-5 seconds or for as long as possible. Allow the piston or ball to fall to the bottom of the column. 7. Remove the mouthpiece from your mouth and breathe out normally. 8. Rest for a few seconds and repeat Steps 1 through 7 at least 10 times every 1-2 hours when you are awake. Take your time and take a few normal breaths between deep breaths. 9. The spirometer may include an indicator to show your best effort. Use the indicator as a goal to work toward during each repetition. 10. After each set of 10 deep breaths, practice coughing to be sure your lungs are clear. If you have an incision (the cut made at the time of surgery), support your incision when coughing by placing a pillow or rolled up towels firmly against it. Once you are able to get out of  bed, walk around indoors and cough well. You may stop using the incentive spirometer when instructed by your caregiver.  RISKS AND COMPLICATIONS  Take your time so you do not get dizzy or light-headed.  If you are in pain,  you may need to take or ask for pain medication before doing incentive spirometry. It is harder to take a deep breath if you are having pain. AFTER USE  Rest and breathe slowly and easily.  It can be helpful to keep track of a log of your progress. Your caregiver can provide you with a simple table to help with this. If you are using the spirometer at home, follow these instructions: SEEK MEDICAL CARE IF:   You are having difficultly using the spirometer.  You have trouble using the spirometer as often as instructed.  Your pain medication is not giving enough relief while using the spirometer.  You develop fever of 100.5 F (38.1 C) or higher. SEEK IMMEDIATE MEDICAL CARE IF:   You cough up bloody sputum that had not been present before.  You develop fever of 102 F (38.9 C) or greater.  You develop worsening pain at or near the incision site. MAKE SURE YOU:   Understand these instructions.  Will watch your condition.  Will get help right away if you are not doing well or get worse. Document Released: 01/28/2007 Document Revised: 12/10/2011 Document Reviewed: 03/31/2007 [facility] Patient Information 2014 [place], [place].   ________________________________________________________________________

## 2020-05-11 ENCOUNTER — Encounter (HOSPITAL_COMMUNITY): Payer: Self-pay

## 2020-05-11 ENCOUNTER — Encounter (HOSPITAL_COMMUNITY)
Admission: RE | Admit: 2020-05-11 | Discharge: 2020-05-11 | Disposition: A | Discharge status: Home / Self Care | Payer: Medicare Other | Source: Ambulatory Visit | Attending: Orthopedic Surgery | Admitting: Orthopedic Surgery

## 2020-05-11 ENCOUNTER — Other Ambulatory Visit: Payer: Self-pay

## 2020-05-11 DIAGNOSIS — Z01812 Encounter for preprocedural laboratory examination: Secondary | ICD-10-CM | POA: Insufficient documentation

## 2020-05-11 HISTORY — DX: Dyspnea, unspecified: R06.00

## 2020-05-11 HISTORY — DX: Cerebral infarction, unspecified: I63.9

## 2020-05-11 LAB — COMPREHENSIVE METABOLIC PANEL
ALT: 16 U/L (ref 0–44)
AST: 19 U/L (ref 15–41)
Albumin: 4.2 g/dL (ref 3.5–5.0)
Alkaline Phosphatase: 37 U/L — ABNORMAL LOW (ref 38–126)
Anion gap: 9 (ref 5–15)
BUN: 18 mg/dL (ref 8–23)
CO2: 28 mmol/L (ref 22–32)
Calcium: 10.2 mg/dL (ref 8.9–10.3)
Chloride: 102 mmol/L (ref 98–111)
Creatinine, Ser: 1.24 mg/dL — ABNORMAL HIGH (ref 0.44–1.00)
GFR calc Af Amer: 48 mL/min — ABNORMAL LOW (ref >60)
GFR calc non Af Amer: 41 mL/min — ABNORMAL LOW (ref >60)
Glucose, Bld: 110 mg/dL — ABNORMAL HIGH (ref 70–99)
Potassium: 3.8 mmol/L (ref 3.5–5.1)
Sodium: 139 mmol/L (ref 135–145)
Total Bilirubin: 0.5 mg/dL (ref 0.3–1.2)
Total Protein: 7.3 g/dL (ref 6.5–8.1)

## 2020-05-11 LAB — CBC
HCT: 42.2 % (ref 36.0–46.0)
Hemoglobin: 13.7 g/dL (ref 12.0–15.0)
MCH: 30.2 pg (ref 26.0–34.0)
MCHC: 32.5 g/dL (ref 30.0–36.0)
MCV: 93.2 fL (ref 80.0–100.0)
Platelets: 254 10*3/uL (ref 150–400)
RBC: 4.53 MIL/uL (ref 3.87–5.11)
RDW: 12.8 % (ref 11.5–15.5)
WBC: 9.1 10*3/uL (ref 4.0–10.5)
nRBC: 0 % (ref 0.0–0.2)

## 2020-05-11 LAB — URINALYSIS, ROUTINE W REFLEX MICROSCOPIC
Bilirubin Urine: NEGATIVE
Glucose, UA: NEGATIVE mg/dL
Hgb urine dipstick: NEGATIVE
Ketones, ur: NEGATIVE mg/dL
Nitrite: NEGATIVE
Protein, ur: 30 mg/dL — AB
Specific Gravity, Urine: 1.019 (ref 1.005–1.030)
WBC, UA: >50 WBC/hpf — ABNORMAL HIGH (ref 0–5)
pH: 5 (ref 5.0–8.0)

## 2020-05-11 LAB — PROTIME-INR
INR: 1 (ref 0.8–1.2)
Prothrombin Time: 12.9 seconds (ref 11.4–15.2)

## 2020-05-11 LAB — SURGICAL PCR SCREEN
MRSA, PCR: NEGATIVE
Staphylococcus aureus: NEGATIVE

## 2020-05-11 NOTE — Progress Notes (Signed)
reQUESTED MOST RECENT EKG AND OFFICE VISIT NOTE FROM [facility]

## 2020-05-11 NOTE — Progress Notes (Signed)
Pt called back for clarification of preop instructions.   Reviewed hibiclens shower routne with patient and pt voiced understanding.

## 2020-05-11 NOTE — Progress Notes (Addendum)
rOUTED U/A DONE 05/11/20 TO dR [id number].  Also routed CMp results to Dr Linna Caprice.

## 2020-05-12 NOTE — Progress Notes (Addendum)
EKg received from [facility] after 2 attemtps.  Placed on chart Dated 03/22/20.  Called and requested office visit note related to that visit  Office visit of 03/03/20 to be faxed per office.

## 2020-05-13 NOTE — Progress Notes (Signed)
Anesthesia Review:  [facility]  Cardiologist : Chest x-ray : EKG : Echo :03/22/20 on chart from [facility] received on 05/12/20- also office visit note of 03/03/20 on chart  Stress test: Cardiac Cath :  Activity level: can do a flight of stairs without difficulty  Sleep Study/ CPAP : no Fasting Blood Sugar :      / Checks Blood Sugar -- times a day:   Blood Thinner/ Instructions /Last Dose: ASA / Instructions/ Last Dose :  EKG has been faxed x 2 from office Unable to read interpretation on EKG.

## 2020-05-14 ENCOUNTER — Other Ambulatory Visit (HOSPITAL_COMMUNITY)
Admission: RE | Admit: 2020-05-14 | Discharge: 2020-05-14 | Disposition: A | Discharge status: Home / Self Care | Payer: Medicare Other | Source: Ambulatory Visit | Attending: Orthopedic Surgery | Admitting: Orthopedic Surgery

## 2020-05-14 DIAGNOSIS — Z20822 Contact with and (suspected) exposure to covid-19: Secondary | ICD-10-CM | POA: Diagnosis not present

## 2020-05-14 DIAGNOSIS — Z01812 Encounter for preprocedural laboratory examination: Secondary | ICD-10-CM | POA: Insufficient documentation

## 2020-05-14 LAB — SARS CORONAVIRUS 2 (TAT 6-24 HRS): SARS Coronavirus 2: NEGATIVE

## 2020-05-18 ENCOUNTER — Ambulatory Visit (HOSPITAL_COMMUNITY): Payer: Medicare Other | Admitting: Anesthesiology

## 2020-05-18 ENCOUNTER — Encounter (HOSPITAL_COMMUNITY)
Admission: RE | Disposition: A | Discharge status: Home / Self Care | Payer: Self-pay | Source: Home / Self Care | Attending: Orthopedic Surgery

## 2020-05-18 ENCOUNTER — Other Ambulatory Visit: Payer: Self-pay

## 2020-05-18 ENCOUNTER — Observation Stay (HOSPITAL_COMMUNITY)
Admission: RE | Admit: 2020-05-18 | Discharge: 2020-05-19 | Disposition: A | Discharge status: Home / Self Care | Payer: Medicare Other | Attending: Orthopedic Surgery | Admitting: Orthopedic Surgery

## 2020-05-18 ENCOUNTER — Observation Stay (HOSPITAL_COMMUNITY): Payer: Medicare Other

## 2020-05-18 ENCOUNTER — Ambulatory Visit (HOSPITAL_COMMUNITY): Payer: Medicare Other | Admitting: Physician Assistant

## 2020-05-18 ENCOUNTER — Encounter (HOSPITAL_COMMUNITY): Payer: Self-pay | Admitting: Orthopedic Surgery

## 2020-05-18 DIAGNOSIS — Z96652 Presence of left artificial knee joint: Secondary | ICD-10-CM

## 2020-05-18 DIAGNOSIS — Z79899 Other long term (current) drug therapy: Secondary | ICD-10-CM | POA: Insufficient documentation

## 2020-05-18 DIAGNOSIS — M1712 Unilateral primary osteoarthritis, left knee: Principal | ICD-10-CM | POA: Insufficient documentation

## 2020-05-18 DIAGNOSIS — I10 Essential (primary) hypertension: Secondary | ICD-10-CM | POA: Insufficient documentation

## 2020-05-18 DIAGNOSIS — Z8673 Personal history of transient ischemic attack (TIA), and cerebral infarction without residual deficits: Secondary | ICD-10-CM | POA: Insufficient documentation

## 2020-05-18 HISTORY — PX: KNEE ARTHROPLASTY: SHX992

## 2020-05-18 LAB — TYPE AND SCREEN
ABO/RH(D): A POS
Antibody Screen: NEGATIVE

## 2020-05-18 LAB — ABO/RH: ABO/RH(D): A POS

## 2020-05-18 SURGERY — ARTHROPLASTY, KNEE, TOTAL, USING IMAGELESS COMPUTER-ASSISTED NAVIGATION
Anesthesia: Spinal | Site: Knee | Laterality: Left

## 2020-05-18 MED ORDER — POVIDONE-IODINE 10 % EX SWAB: 2.0000 "application " | Freq: Once | CUTANEOUS | Status: DC

## 2020-05-18 MED ORDER — CEFAZOLIN SODIUM-DEXTROSE 2-4 GM/100ML-% IV SOLN
2.0000 g | INTRAVENOUS | Status: AC
  Administered 2020-05-18: 2 g via INTRAVENOUS
  Filled 2020-05-18: qty 100

## 2020-05-18 MED ORDER — OXYCODONE HCL 5 MG/5ML PO SOLN: 5.0000 mg | Freq: Once | ORAL | Status: DC | PRN

## 2020-05-18 MED ORDER — ALUM & MAG HYDROXIDE-SIMETH 200-200-20 MG/5ML PO SUSP
30.0000 mL | ORAL | Status: DC | PRN
  Administered 2020-05-18: 30 mL via ORAL
  Filled 2020-05-18: qty 30

## 2020-05-18 MED ORDER — FENOFIBRATE 54 MG PO TABS
54.0000 mg | ORAL_TABLET | Freq: Every day | ORAL | Status: DC
  Administered 2020-05-19: 54 mg via ORAL
  Filled 2020-05-18: qty 1

## 2020-05-18 MED ORDER — ISOPROPYL ALCOHOL 70 % SOLN
Status: DC | PRN
  Administered 2020-05-18: 1 via TOPICAL

## 2020-05-18 MED ORDER — HYDROCODONE-ACETAMINOPHEN 7.5-325 MG PO TABS
1.0000 | ORAL_TABLET | ORAL | Status: DC | PRN
  Administered 2020-05-19: 1 via ORAL
  Filled 2020-05-18: qty 1

## 2020-05-18 MED ORDER — PROMETHAZINE HCL 25 MG/ML IJ SOLN: 6.2500 mg | INTRAMUSCULAR | Status: DC | PRN

## 2020-05-18 MED ORDER — SODIUM CHLORIDE 0.9 % IV SOLN: INTRAVENOUS | Status: DC

## 2020-05-18 MED ORDER — POVIDONE-IODINE 10 % EX SWAB
2.0000 "application " | Freq: Once | CUTANEOUS | Status: AC
  Administered 2020-05-18: 2 via TOPICAL

## 2020-05-18 MED ORDER — BUPIVACAINE HCL (PF) 0.75 % IJ SOLN
INTRAMUSCULAR | Status: DC | PRN
  Administered 2020-05-18: 2 mL via INTRATHECAL

## 2020-05-18 MED ORDER — METHOCARBAMOL 500 MG PO TABS
500.0000 mg | ORAL_TABLET | Freq: Four times a day (QID) | ORAL | Status: DC | PRN
  Administered 2020-05-19: 500 mg via ORAL
  Filled 2020-05-18: qty 1

## 2020-05-18 MED ORDER — EPHEDRINE SULFATE 50 MG/ML IJ SOLN
INTRAMUSCULAR | Status: DC | PRN
  Administered 2020-05-18: 10 mg via INTRAVENOUS

## 2020-05-18 MED ORDER — SODIUM CHLORIDE (PF) 0.9 % IJ SOLN
INTRAMUSCULAR | Status: AC
  Filled 2020-05-18: qty 50

## 2020-05-18 MED ORDER — ONDANSETRON HCL 4 MG PO TABS: 4.0000 mg | ORAL_TABLET | Freq: Four times a day (QID) | ORAL | Status: DC | PRN

## 2020-05-18 MED ORDER — FENTANYL CITRATE (PF) 100 MCG/2ML IJ SOLN
50.0000 ug | INTRAMUSCULAR | Status: AC
  Administered 2020-05-18: 50 ug via INTRAVENOUS
  Filled 2020-05-18: qty 2

## 2020-05-18 MED ORDER — METOCLOPRAMIDE HCL 5 MG PO TABS: 5.0000 mg | ORAL_TABLET | Freq: Three times a day (TID) | ORAL | Status: DC | PRN

## 2020-05-18 MED ORDER — NETARSUDIL-LATANOPROST 0.02-0.005 % OP SOLN: 1.0000 [drp] | Freq: Every day | OPHTHALMIC | Status: DC

## 2020-05-18 MED ORDER — ASPIRIN 81 MG PO CHEW
81.0000 mg | CHEWABLE_TABLET | Freq: Two times a day (BID) | ORAL | Status: DC
  Administered 2020-05-18 – 2020-05-19 (×2): 81 mg via ORAL
  Filled 2020-05-18 (×2): qty 1

## 2020-05-18 MED ORDER — CHLORHEXIDINE GLUCONATE 0.12 % MT SOLN
15.0000 mL | Freq: Once | OROMUCOSAL | Status: AC
  Administered 2020-05-18: 15 mL via OROMUCOSAL

## 2020-05-18 MED ORDER — LIP MEDEX EX OINT
TOPICAL_OINTMENT | CUTANEOUS | Status: AC
  Filled 2020-05-18: qty 7

## 2020-05-18 MED ORDER — PHENYLEPHRINE HCL-NACL 10-0.9 MG/250ML-% IV SOLN
INTRAVENOUS | Status: DC | PRN
  Administered 2020-05-18: 30 ug/min via INTRAVENOUS

## 2020-05-18 MED ORDER — TRANEXAMIC ACID-NACL 1000-0.7 MG/100ML-% IV SOLN
1000.0000 mg | INTRAVENOUS | Status: AC
  Administered 2020-05-18: 1000 mg via INTRAVENOUS
  Filled 2020-05-18: qty 100

## 2020-05-18 MED ORDER — DORZOLAMIDE HCL-TIMOLOL MAL 2-0.5 % OP SOLN
1.0000 [drp] | Freq: Two times a day (BID) | OPHTHALMIC | Status: DC
  Administered 2020-05-18 – 2020-05-19 (×2): 1 [drp] via OPHTHALMIC
  Filled 2020-05-18: qty 10

## 2020-05-18 MED ORDER — MIDAZOLAM HCL 2 MG/2ML IJ SOLN
1.0000 mg | INTRAMUSCULAR | Status: AC
  Administered 2020-05-18: 1 mg via INTRAVENOUS
  Filled 2020-05-18: qty 2

## 2020-05-18 MED ORDER — OXYCODONE HCL 5 MG PO TABS: 5.0000 mg | ORAL_TABLET | Freq: Once | ORAL | Status: DC | PRN

## 2020-05-18 MED ORDER — PHENOL 1.4 % MT LIQD: 1.0000 | OROMUCOSAL | Status: DC | PRN

## 2020-05-18 MED ORDER — PROPOFOL 500 MG/50ML IV EMUL
INTRAVENOUS | Status: DC | PRN
  Administered 2020-05-18: 75 ug/kg/min via INTRAVENOUS

## 2020-05-18 MED ORDER — KETOROLAC TROMETHAMINE 30 MG/ML IJ SOLN
INTRAMUSCULAR | Status: AC
  Filled 2020-05-18: qty 1

## 2020-05-18 MED ORDER — BUPIVACAINE HCL (PF) 0.25 % IJ SOLN
INTRAMUSCULAR | Status: AC
  Filled 2020-05-18: qty 30

## 2020-05-18 MED ORDER — PROPOFOL 10 MG/ML IV BOLUS
INTRAVENOUS | Status: AC
  Filled 2020-05-18: qty 20

## 2020-05-18 MED ORDER — EZETIMIBE 10 MG PO TABS
10.0000 mg | ORAL_TABLET | Freq: Every day | ORAL | Status: DC
  Administered 2020-05-19: 10 mg via ORAL
  Filled 2020-05-18: qty 1

## 2020-05-18 MED ORDER — HYDROCODONE-ACETAMINOPHEN 5-325 MG PO TABS
1.0000 | ORAL_TABLET | ORAL | Status: DC | PRN
  Administered 2020-05-19: 1 via ORAL
  Filled 2020-05-18: qty 1

## 2020-05-18 MED ORDER — MORPHINE SULFATE (PF) 2 MG/ML IV SOLN: 0.5000 mg | INTRAVENOUS | Status: DC | PRN

## 2020-05-18 MED ORDER — KETOROLAC TROMETHAMINE 30 MG/ML IJ SOLN
INTRAMUSCULAR | Status: DC | PRN
  Administered 2020-05-18: 30 mg

## 2020-05-18 MED ORDER — SODIUM CHLORIDE 0.9 % IR SOLN
Status: DC | PRN
  Administered 2020-05-18: 4000 mL

## 2020-05-18 MED ORDER — SODIUM CHLORIDE (PF) 0.9 % IJ SOLN
INTRAMUSCULAR | Status: DC | PRN
  Administered 2020-05-18: 30 mL

## 2020-05-18 MED ORDER — METOCLOPRAMIDE HCL 5 MG/ML IJ SOLN: 5.0000 mg | Freq: Three times a day (TID) | INTRAMUSCULAR | Status: DC | PRN

## 2020-05-18 MED ORDER — FENTANYL CITRATE (PF) 100 MCG/2ML IJ SOLN
INTRAMUSCULAR | Status: AC
  Filled 2020-05-18: qty 2

## 2020-05-18 MED ORDER — CEFAZOLIN SODIUM-DEXTROSE 2-4 GM/100ML-% IV SOLN
2.0000 g | Freq: Four times a day (QID) | INTRAVENOUS | Status: AC
  Administered 2020-05-18 – 2020-05-19 (×2): 2 g via INTRAVENOUS
  Filled 2020-05-18 (×2): qty 100

## 2020-05-18 MED ORDER — LACTATED RINGERS IV SOLN: INTRAVENOUS | Status: DC

## 2020-05-18 MED ORDER — ROPIVACAINE HCL 5 MG/ML IJ SOLN
INTRAMUSCULAR | Status: DC | PRN
  Administered 2020-05-18: 20 mL via PERINEURAL

## 2020-05-18 MED ORDER — MENTHOL 3 MG MT LOZG: 1.0000 | LOZENGE | OROMUCOSAL | Status: DC | PRN

## 2020-05-18 MED ORDER — DEXAMETHASONE SODIUM PHOSPHATE 10 MG/ML IJ SOLN
10.0000 mg | Freq: Once | INTRAMUSCULAR | Status: AC
  Administered 2020-05-19: 10 mg via INTRAVENOUS
  Filled 2020-05-18: qty 1

## 2020-05-18 MED ORDER — ONDANSETRON HCL 4 MG/2ML IJ SOLN
INTRAMUSCULAR | Status: DC | PRN
  Administered 2020-05-18: 4 mg via INTRAVENOUS

## 2020-05-18 MED ORDER — POLYETHYLENE GLYCOL 3350 17 G PO PACK: 17.0000 g | PACK | Freq: Every day | ORAL | Status: DC | PRN

## 2020-05-18 MED ORDER — METHOCARBAMOL 500 MG IVPB - SIMPLE MED
500.0000 mg | Freq: Four times a day (QID) | INTRAVENOUS | Status: DC | PRN
  Administered 2020-05-18: 500 mg via INTRAVENOUS
  Filled 2020-05-18: qty 50

## 2020-05-18 MED ORDER — CELECOXIB 200 MG PO CAPS
200.0000 mg | ORAL_CAPSULE | Freq: Two times a day (BID) | ORAL | Status: DC
  Administered 2020-05-18 – 2020-05-19 (×2): 200 mg via ORAL
  Filled 2020-05-18 (×2): qty 1

## 2020-05-18 MED ORDER — BUPIVACAINE HCL (PF) 0.25 % IJ SOLN
INTRAMUSCULAR | Status: DC | PRN
  Administered 2020-05-18: 30 mL

## 2020-05-18 MED ORDER — HYDROMORPHONE HCL 1 MG/ML IJ SOLN: 0.2500 mg | INTRAMUSCULAR | Status: DC | PRN

## 2020-05-18 MED ORDER — FENTANYL CITRATE (PF) 100 MCG/2ML IJ SOLN
INTRAMUSCULAR | Status: DC | PRN
  Administered 2020-05-18 (×2): 50 ug via INTRAVENOUS

## 2020-05-18 MED ORDER — ORAL CARE MOUTH RINSE: 15.0000 mL | Freq: Once | OROMUCOSAL | Status: AC

## 2020-05-18 MED ORDER — STERILE WATER FOR IRRIGATION IR SOLN
Status: DC | PRN
  Administered 2020-05-18: 2000 mL

## 2020-05-18 MED ORDER — DOCUSATE SODIUM 100 MG PO CAPS
100.0000 mg | ORAL_CAPSULE | Freq: Two times a day (BID) | ORAL | Status: DC
  Administered 2020-05-18 – 2020-05-19 (×2): 100 mg via ORAL
  Filled 2020-05-18 (×2): qty 1

## 2020-05-18 MED ORDER — VITAMIN D 25 MCG (1000 UNIT) PO TABS
1000.0000 [IU] | ORAL_TABLET | Freq: Every day | ORAL | Status: DC
  Administered 2020-05-19: 1000 [IU] via ORAL
  Filled 2020-05-18: qty 1

## 2020-05-18 MED ORDER — ONDANSETRON HCL 4 MG/2ML IJ SOLN
4.0000 mg | Freq: Four times a day (QID) | INTRAMUSCULAR | Status: DC | PRN
  Administered 2020-05-19: 4 mg via INTRAVENOUS
  Filled 2020-05-18: qty 2

## 2020-05-18 MED ORDER — ACETAMINOPHEN 325 MG PO TABS: 325.0000 mg | ORAL_TABLET | Freq: Four times a day (QID) | ORAL | Status: DC | PRN

## 2020-05-18 MED ORDER — METHOCARBAMOL 500 MG IVPB - SIMPLE MED
INTRAVENOUS | Status: AC
  Filled 2020-05-18: qty 50

## 2020-05-18 MED ORDER — ACETAMINOPHEN 10 MG/ML IV SOLN
1000.0000 mg | Freq: Once | INTRAVENOUS | Status: AC
  Administered 2020-05-18: 1000 mg via INTRAVENOUS
  Filled 2020-05-18: qty 100

## 2020-05-18 MED ORDER — DIPHENHYDRAMINE HCL 12.5 MG/5ML PO ELIX: 12.5000 mg | ORAL_SOLUTION | ORAL | Status: DC | PRN

## 2020-05-18 SURGICAL SUPPLY — 73 items
ADH SKN CLS APL DERMABOND .7 (GAUZE/BANDAGES/DRESSINGS) ×1
APL PRP STRL LF DISP 70% ISPRP (MISCELLANEOUS) ×2
BAG SPEC THK2 15X12 ZIP CLS (MISCELLANEOUS)
BAG ZIPLOCK 12X15 (MISCELLANEOUS) IMPLANT
BATTERY INSTRU NAVIGATION (MISCELLANEOUS) ×9 IMPLANT
BLADE SAW RECIPROCATING 77.5 (BLADE) ×3 IMPLANT
BNDG ELASTIC 4X5.8 VLCR STR LF (GAUZE/BANDAGES/DRESSINGS) ×3 IMPLANT
BNDG ELASTIC 6X5.8 VLCR STR LF (GAUZE/BANDAGES/DRESSINGS) ×3 IMPLANT
BSPLAT TIB 4 KN TRITANIUM (Knees) ×1 IMPLANT
BTRY SRG DRVR LF (MISCELLANEOUS) ×3
CHLORAPREP W/TINT 26 (MISCELLANEOUS) ×6 IMPLANT
COMP FEM SZ5 CRUC LEFT RETAIN (Orthopedic Implant) ×3 IMPLANT
COMPONENT FEM SZ5 CRU LT RETN (Orthopedic Implant) IMPLANT
COVER SURGICAL LIGHT HANDLE (MISCELLANEOUS) ×3 IMPLANT
COVER WAND RF STERILE (DRAPES) IMPLANT
CUFF TOURN SGL QUICK 34 (TOURNIQUET CUFF) ×3
CUFF TRNQT CYL 34X4.125X (TOURNIQUET CUFF) ×1 IMPLANT
DECANTER SPIKE VIAL GLASS SM (MISCELLANEOUS) ×6 IMPLANT
DERMABOND ADVANCED (GAUZE/BANDAGES/DRESSINGS) ×2
DERMABOND ADVANCED .7 DNX12 (GAUZE/BANDAGES/DRESSINGS) ×2 IMPLANT
DRAPE SHEET LG 3/4 BI-LAMINATE (DRAPES) ×9 IMPLANT
DRAPE U-SHAPE 47X51 STRL (DRAPES) ×3 IMPLANT
DRSG AQUACEL AG ADV 3.5X10 (GAUZE/BANDAGES/DRESSINGS) ×3 IMPLANT
DRSG TEGADERM 4X4.75 (GAUZE/BANDAGES/DRESSINGS) IMPLANT
ELECT BLADE TIP CTD 4 INCH (ELECTRODE) ×3 IMPLANT
ELECT REM PT RETURN 15FT ADLT (MISCELLANEOUS) ×3 IMPLANT
EVACUATOR 1/8 PVC DRAIN (DRAIN) IMPLANT
GAUZE SPONGE 4X4 12PLY STRL (GAUZE/BANDAGES/DRESSINGS) ×3 IMPLANT
GLOVE BIO SURGEON STRL SZ8.5 (GLOVE) ×6 IMPLANT
GLOVE BIOGEL PI IND STRL 8.5 (GLOVE) ×1 IMPLANT
GLOVE BIOGEL PI INDICATOR 8.5 (GLOVE) ×2
GOWN SPEC L3 XXLG W/TWL (GOWN DISPOSABLE) ×3 IMPLANT
HANDPIECE INTERPULSE COAX TIP (DISPOSABLE) ×3
HOLDER FOLEY CATH W/STRAP (MISCELLANEOUS) ×3 IMPLANT
HOOD PEEL AWAY FLYTE STAYCOOL (MISCELLANEOUS) ×9 IMPLANT
INSERT TRIATH CS SZ4 14 (Insert) ×2 IMPLANT
JET LAVAGE IRRISEPT WOUND (IRRIGATION / IRRIGATOR) ×3
KIT TURNOVER KIT A (KITS) IMPLANT
KNEE PATELLA ASYMMETRIC 10X32 (Knees) ×2 IMPLANT
KNEE TIBIAL COMP TRI SZ4 (Knees) ×2 IMPLANT
LAVAGE JET IRRISEPT WOUND (IRRIGATION / IRRIGATOR) ×1 IMPLANT
MARKER SKIN DUAL TIP RULER LAB (MISCELLANEOUS) ×3 IMPLANT
NDL SAFETY ECLIPSE 18X1.5 (NEEDLE) ×1 IMPLANT
NDL SPNL 18GX3.5 QUINCKE PK (NEEDLE) ×1 IMPLANT
NEEDLE HYPO 18GX1.5 SHARP (NEEDLE) ×3
NEEDLE SPNL 18GX3.5 QUINCKE PK (NEEDLE) ×3 IMPLANT
NS IRRIG 1000ML POUR BTL (IV SOLUTION) ×3 IMPLANT
PACK TOTAL KNEE CUSTOM (KITS) ×3 IMPLANT
PADDING CAST COTTON 6X4 STRL (CAST SUPPLIES) ×3 IMPLANT
PENCIL SMOKE EVACUATOR (MISCELLANEOUS) IMPLANT
PIN FLUTED HEDLESS FIX 3.5X1/8 (PIN) ×2 IMPLANT
PROTECTOR NERVE ULNAR (MISCELLANEOUS) ×3 IMPLANT
SAW OSC TIP CART 19.5X105X1.3 (SAW) ×3 IMPLANT
SEALER BIPOLAR AQUA 6.0 (INSTRUMENTS) ×3 IMPLANT
SET HNDPC FAN SPRY TIP SCT (DISPOSABLE) ×1 IMPLANT
SET PAD KNEE POSITIONER (MISCELLANEOUS) ×3 IMPLANT
SPONGE DRAIN TRACH 4X4 STRL 2S (GAUZE/BANDAGES/DRESSINGS) IMPLANT
SUT MNCRL AB 3-0 PS2 18 (SUTURE) ×3 IMPLANT
SUT MNCRL AB 4-0 PS2 18 (SUTURE) ×3 IMPLANT
SUT MON AB 2-0 CT1 36 (SUTURE) ×3 IMPLANT
SUT STRATAFIX PDO 1 14 VIOLET (SUTURE) ×3
SUT STRATFX PDO 1 14 VIOLET (SUTURE) ×1
SUT VIC AB 1 CTX 36 (SUTURE) ×6
SUT VIC AB 1 CTX36XBRD ANBCTR (SUTURE) ×2 IMPLANT
SUT VIC AB 2-0 CT1 27 (SUTURE) ×3
SUT VIC AB 2-0 CT1 TAPERPNT 27 (SUTURE) ×1 IMPLANT
SUTURE STRATFX PDO 1 14 VIOLET (SUTURE) ×1 IMPLANT
SYR 3ML LL SCALE MARK (SYRINGE) ×3 IMPLANT
TOWER CARTRIDGE SMART MIX (DISPOSABLE) IMPLANT
TRAY FOLEY MTR SLVR 16FR STAT (SET/KITS/TRAYS/PACK) IMPLANT
TUBE SUCTION HIGH CAP CLEAR NV (SUCTIONS) ×3 IMPLANT
WATER STERILE IRR 1000ML POUR (IV SOLUTION) ×6 IMPLANT
WRAP KNEE MAXI GEL POST OP (GAUZE/BANDAGES/DRESSINGS) ×2 IMPLANT

## 2020-05-18 NOTE — Progress Notes (Signed)
Assisted Dr. Miller with left, ultrasound guided, adductor canal block. Side rails up, monitors on throughout procedure. See vital signs in flow sheet. Tolerated Procedure well.  

## 2020-05-18 NOTE — Discharge Instructions (Signed)
° °Dr. Leslieanne Cobarrubias °Total Joint Specialist °[facility] °[address] °[place], [place] [address] °[phone] ° °TOTAL KNEE REPLACEMENT POSTOPERATIVE DIRECTIONS ° ° ° °Knee Rehabilitation, Guidelines Following Surgery  °Results after knee surgery are often greatly improved when you follow the exercise, range of motion and muscle strengthening exercises prescribed by your doctor. Safety measures are also important to protect the knee from further injury. Any time any of these exercises cause you to have increased pain or swelling in your knee joint, decrease the amount until you are comfortable again and slowly increase them. If you have problems or questions, call your caregiver or physical therapist for advice.  ° °WEIGHT BEARING °Weight bearing as tolerated with assist device (walker, cane, etc) as directed, use it as long as suggested by your surgeon or therapist, typically at least 4-6 weeks. ° °HOME CARE INSTRUCTIONS  °Remove items at home which could result in a fall. This includes throw rugs or furniture in walking pathways.  °Continue medications as instructed at time of discharge. °You may have some home medications which will be placed on hold until you complete the course of blood thinner medication.  °You may start showering once you are discharged home but do not submerge the incision under water. Just pat the incision dry and apply a dry gauze dressing on daily. °Walk with walker as instructed.  °You may resume a sexual relationship in one month or when given the OK by your doctor.  °· Use walker as long as suggested by your caregivers. °· Avoid periods of inactivity such as sitting longer than an hour when not asleep. This helps prevent blood clots.  °You may put full weight on your legs and walk as much as is comfortable.  °You may return to work once you are cleared by your doctor.  °Do not drive a car for 6 weeks or until released by you surgeon.  °· Do not drive  while taking narcotics.  °Wear the elastic stockings for three weeks following surgery during the day but you may remove then at night. °Make sure you keep all of your appointments after your operation with all of your doctors and caregivers. You should call the office at the above phone number and make an appointment for approximately two weeks after the date of your surgery. °Do not remove your surgical dressing. The dressing is waterproof; you may take showers in 3 days, but do not take tub baths or submerge the dressing. °Please pick up a stool softener and laxative for home use as long as you are requiring pain medications. °· ICE to the affected knee every three hours for 30 minutes at a time and then as needed for pain and swelling.  Continue to use ice on the knee for pain and swelling from surgery. You may notice swelling that will progress down to the foot and ankle.  This is normal after surgery.  Elevate the leg when you are not up walking on it.   °It is important for you to complete the blood thinner medication as prescribed by your doctor. °· Continue to use the breathing machine which will help keep your temperature down.  It is common for your temperature to cycle up and down following surgery, especially at night when you are not up moving around and exerting yourself.  The breathing machine keeps your lungs expanded and your temperature down. ° °RANGE OF MOTION AND STRENGTHENING EXERCISES  °Rehabilitation of the knee is important following   a knee injury or an operation. After just a few days of immobilization, the muscles of the thigh which control the knee become weakened and shrink (atrophy). Knee exercises are designed to build up the tone and strength of the thigh muscles and to improve knee motion. Often times heat used for twenty to thirty minutes before working out will loosen up your tissues and help with improving the range of motion but do not use heat for the first two weeks following  surgery. These exercises can be done on a training (exercise) mat, on the floor, on a table or on a bed. Use what ever works the best and is most comfortable for you Knee exercises include:  °Leg Lifts - While your knee is still immobilized in a splint or cast, you can do straight leg raises. Lift the leg to 60 degrees, hold for 3 sec, and slowly lower the leg. Repeat 10-20 times 2-3 times daily. Perform this exercise against resistance later as your knee gets better.  °Quad and Hamstring Sets - Tighten up the muscle on the front of the thigh (Quad) and hold for 5-10 sec. Repeat this 10-20 times hourly. Hamstring sets are done by pushing the foot backward against an object and holding for 5-10 sec. Repeat as with quad sets.  °A rehabilitation program following serious knee injuries can speed recovery and prevent re-injury in the future due to weakened muscles. Contact your doctor or a physical therapist for more information on knee rehabilitation.  ° °SKILLED REHAB INSTRUCTIONS: °If the patient is transferred to a skilled rehab facility following release from the hospital, a list of the current medications will be sent to the facility for the patient to continue.  When discharged from the skilled rehab facility, please have the facility set up the patient's Home Health Physical Therapy prior to being released. Also, the skilled facility will be responsible for providing the patient with their medications at time of release from the facility to include their pain medication, the muscle relaxants, and their blood thinner medication. If the patient is still at the rehab facility at time of the two week follow up appointment, the skilled rehab facility will also need to assist the patient in arranging follow up appointment in our office and any transportation needs. ° °MAKE SURE YOU:  °Understand these instructions.  °Will watch your condition.  °Will get help right away if you are not doing well or get worse.   ° ° °Pick up stool softner and laxative for home use following surgery while on pain medications. °Do NOT remove your dressing. You may shower.  °Do not take tub baths or submerge incision under water. °May shower starting three days after surgery. °Please use a clean towel to pat the incision dry following showers. °Continue to use ice for pain and swelling after surgery. °Do not use any lotions or creams on the incision until instructed by your surgeon. ° °

## 2020-05-18 NOTE — Interval H&P Note (Signed)
History and Physical Interval Note:  05/18/2020 10:44 AM  Monique Wagner  has presented today for surgery, with the diagnosis of Left knee osteosrthritis.  The various methods of treatment have been discussed with the patient and family. After consideration of risks, benefits and other options for treatment, the patient has consented to  Procedure(s): COMPUTER ASSISTED TOTAL KNEE ARTHROPLASTY (Left) as a surgical intervention.  The patient's history has been reviewed, patient examined, no change in status, stable for surgery.  I have reviewed the patient's chart and labs.  Questions were answered to the patient's satisfaction.     Iline Oven [name]

## 2020-05-18 NOTE — Plan of Care (Signed)
  Problem: Education: Goal: Knowledge of General Education information will improve Description: Including pain rating scale, medication(s)/side effects and non-pharmacologic comfort measures Outcome: Progressing   Problem: Activity: Goal: Risk for activity intolerance will decrease Outcome: Progressing   Problem: Nutrition: Goal: Adequate nutrition will be maintained Outcome: Progressing   Problem: Elimination: Goal: Will not experience complications related to bowel motility Outcome: Progressing   Problem: Pain Managment: Goal: General experience of comfort will improve Outcome: Progressing   Problem: Education: Goal: Knowledge of the prescribed therapeutic regimen will improve Outcome: Progressing   Problem: Activity: Goal: Ability to avoid complications of mobility impairment will improve Outcome: Progressing   Problem: Pain Management: Goal: Pain level will decrease with appropriate interventions Outcome: Progressing   Problem: Education: Goal: Knowledge of the prescribed therapeutic regimen will improve Outcome: Progressing   Problem: Activity: Goal: Ability to avoid complications of mobility impairment will improve Outcome: Progressing   Problem: Clinical Measurements: Goal: Postoperative complications will be avoided or minimized Outcome: Progressing

## 2020-05-18 NOTE — Op Note (Signed)
OPERATIVE REPORT  SURGEON: Rod Can, MD   ASSISTANT: Cherlynn June, PA-C  PREOPERATIVE DIAGNOSIS: Left knee arthritis.   POSTOPERATIVE DIAGNOSIS: Left knee arthritis.   PROCEDURE: Left total knee arthroplasty.   IMPLANTS: Stryker Triathlon CR femur, size 5. Stryker Tritanium tibia, size 4. X3 polyethelyene insert, size 14 mm, CS. 3 button asymmetric patella, size 32 mm.  ANESTHESIA:  MAC, Regional and Spinal  TOURNIQUET TIME: Not utilized.   ESTIMATED BLOOD LOSS:-150 mL    ANTIBIOTICS: 2g Ancef.  DRAINS: None.  COMPLICATIONS: None   CONDITION: PACU - hemodynamically stable.   BRIEF CLINICAL NOTE: Monique Wagner is a 79 y.o. female with a long-standing history of Left knee arthritis. After failing conservative management, the patient was indicated for total knee arthroplasty. The risks, benefits, and alternatives to the procedure were explained, and the patient elected to proceed.  PROCEDURE IN DETAIL: Adductor canal block was obtained in the pre-op holding area. Once inside the operative room, spinal anesthesia was obtained, and a foley catheter was inserted. The patient was then positioned, a nonsterile tourniquet was placed, and the lower extremity was prepped and draped in the normal sterile surgical fashion.  A time-out was called verifying side and site of surgery. The patient received IV antibiotics within 60 minutes of beginning the procedure. The tourniquet was not utilized.   An anterior approach to the knee was performed utilizing a midvastus arthrotomy. A medial release was performed and the patellar fat pad was excised. Stryker navigation was used to cut the distal femur perpendicular to the mechanical axis. A freehand patellar resection was performed, and the patella was sized an prepared with 3 lug holes.  Nagivation was used to make a neutral proximal tibia  resection, taking 9 mm of bone from the less affected lateral side with 3 degrees of slope. The menisci were excised. A spacer block was placed, and the alignment and balance in extension were confirmed.   The distal femur was sized using the 3-degree external rotation guide referencing the posterior femoral cortex. The appropriate 4-in-1 cutting block was pinned into place. Rotation was checked using Whiteside's line, the epicondylar axis, and then confirmed with a spacer block in flexion. The remaining femoral cuts were performed, taking care to protect the MCL.  The tibia was sized and the trial tray was pinned into place. The remaining trail components were inserted. The knee was stable to varus and valgus stress through a full range of motion. The patella tracked centrally, and the PCL was well balanced. The trial components were removed, and the proximal tibial surface was prepared. Final components were impacted into place. The knee was tested for a final time and found to be well balanced.   The wound was copiously irrigated with Irrisept solution and normal saline using pule lavage.  Marcaine solution was injected into the periarticular soft tissue.  The wound was closed in layers using #1 Vicryl and Stratafix for the fascia, 2-0 Vicryl for the subcutaneous fat, 2-0 Monocryl for the deep dermal layer, 3-0 running Monocryl subcuticular Stitch, and 4-0 Monocryl stay sutures at both ends of the wound. Dermabond was applied to the skin.  Once the glue was fully dried, an Aquacell Ag and compressive dressing were applied.  Tthe patient was transported to the recovery room in stable condition.  Sponge, needle, and instrument counts were correct at the end of the case x2.  The patient tolerated the procedure well and there were no known complications.  Please note that a  surgical assistant was a medical necessity for this procedure in order to perform it in a safe and expeditious manner. Surgical assistant  was necessary to retract the ligaments and vital neurovascular structures to prevent injury to them and also necessary for proper positioning of the limb to allow for anatomic placement of the prosthesis.

## 2020-05-18 NOTE — Anesthesia Postprocedure Evaluation (Signed)
Anesthesia Post Note  Patient: Monique Wagner  Procedure(s) Performed: COMPUTER ASSISTED TOTAL KNEE ARTHROPLASTY (Left Knee)     Patient location during evaluation: PACU Anesthesia Type: Spinal Level of consciousness: awake and alert Pain management: pain level controlled Vital Signs Assessment: post-procedure vital signs reviewed and stable Respiratory status: spontaneous breathing, nonlabored ventilation and respiratory function stable Cardiovascular status: blood pressure returned to baseline and stable Postop Assessment: no apparent nausea or vomiting Anesthetic complications: no   No complications documented.  Last Vitals:  Vitals:   05/18/20 1645 05/18/20 1702  BP: (!) 105/55 (!) 113/54  Pulse: (!) 58 (!) 55  Resp: (!) 22 18  Temp:    SpO2: 100% 100%    Last Pain:  Vitals:   05/18/20 1645  TempSrc:   PainSc: 0-No pain                 Lowella Curb

## 2020-05-18 NOTE — Anesthesia Procedure Notes (Signed)
Anesthesia Regional Block: Adductor canal block   Pre-Anesthetic Checklist: ,, timeout performed, Correct Patient, Correct Site, Correct Laterality, Correct Procedure, Correct Position, site marked, Risks and benefits discussed,  Surgical consent,  Pre-op evaluation,  At surgeon's request and post-op pain management  Laterality: Left  Prep: chloraprep       Needles:  Injection technique: Single-shot  Needle Type: Stimiplex     Needle Length: 9cm  Needle Gauge: 21     Additional Needles:   Procedures:,,,, ultrasound used (permanent image in chart),,,,  Narrative:  Start time: 05/18/2020 11:19 AM End time: 05/18/2020 11:24 AM Injection made incrementally with aspirations every 5 mL.  Performed by: Personally  Anesthesiologist: Lowella Curb, MD

## 2020-05-18 NOTE — Anesthesia Preprocedure Evaluation (Addendum)
Anesthesia Evaluation  Patient identified by MRN, date of birth, ID band Patient awake    Reviewed: Allergy & Precautions, NPO status , Patient's Chart, lab work & pertinent test results  Airway Mallampati: II  TM Distance: >3 FB Neck ROM: Full    Dental no notable dental hx.    Pulmonary neg pulmonary ROS,    Pulmonary exam normal breath sounds clear to auscultation       Cardiovascular hypertension, Pt. on medications negative cardio ROS Normal cardiovascular exam Rhythm:Regular Rate:Normal     Neuro/Psych negative neurological ROS  negative psych ROS   GI/Hepatic negative GI ROS, Neg liver ROS,   Endo/Other  negative endocrine ROS  Renal/GU negative Renal ROS  negative genitourinary   Musculoskeletal  (+) Arthritis , Osteoarthritis,    Abdominal   Peds negative pediatric ROS (+)  Hematology negative hematology ROS (+)   Anesthesia Other Findings   Reproductive/Obstetrics negative OB ROS                            Anesthesia Physical Anesthesia Plan  ASA: II  Anesthesia Plan: Spinal   Post-op Pain Management:  Regional for Post-op pain   Induction: Intravenous  PONV Risk Score and Plan: 2 and Ondansetron, Midazolam and Treatment may vary due to age or medical condition  Airway Management Planned: Simple Face Mask  Additional Equipment:   Intra-op Plan:   Post-operative Plan:   Informed Consent: I have reviewed the patients History and Physical, chart, labs and discussed the procedure including the risks, benefits and alternatives for the proposed anesthesia with the patient or authorized representative who has indicated his/her understanding and acceptance.     Dental advisory given  Plan Discussed with: CRNA  Anesthesia Plan Comments:         Anesthesia Quick Evaluation

## 2020-05-18 NOTE — Evaluation (Signed)
Physical Therapy Evaluation Patient Details Name: Monique Wagner MRN: [id number] DOB: 1941/03/22 Today's Date: 05/18/2020   History of Present Illness  Patient is 79 y.o. female s/p Lt TKA on 05/18/20 with PMH significant for stroke, HLD, HTN, dyspnea, OA.  Clinical Impression  Monique Wagner is a 79 y.o. female POD 0 s/p Lt TKA. Patient reports independence with mobility at baseline. Patient is now limited by functional impairments (see PT problem list below) and requires min assist for transfers and gait with RW. Patient was able to ambulate ~60 feet with RW and min assist. Patient instructed in exercise to facilitate ROM and circulation. Patient will benefit from continued skilled PT interventions to address impairments and progress towards PLOF. Acute PT will follow to progress mobility and stair training in preparation for safe discharge home.     Follow Up Recommendations Follow surgeon's recommendation for DC plan and follow-up therapies;Outpatient PT    Equipment Recommendations  Rolling walker with 5" wheels;3in1 (PT)    Recommendations for Other Services       Precautions / Restrictions Precautions Precautions: Fall Restrictions Weight Bearing Restrictions: No Other Position/Activity Restrictions: WBAT      Mobility  Bed Mobility Overal bed mobility: Needs Assistance Bed Mobility: Supine to Sit     Supine to sit: HOB elevated;Min guard     General bed mobility comments: cues to use bed rail, guarding for safety,  Transfers Overall transfer level: Needs assistance Equipment used: Rolling walker (2 wheeled) Transfers: Sit to/from Stand Sit to Stand: Min assist         General transfer comment: VC's for safe technique with RW, Min assist to initaite power up and steady with rise.  Ambulation/Gait Ambulation/Gait assistance: Min assist;Min guard Gait Distance (Feet): 60 Feet Assistive device: Rolling walker (2 wheeled) Gait Pattern/deviations: Step-to  pattern;Decreased stride length;Decreased stance time - left;Decreased weight shift to left Gait velocity: decr   General Gait Details: VC's for step pattern and proximity to RW. no overt LOB, no buckling at Lt knee. pt progressed to min guard for forward gait and min assist for turns.   Stairs            Wheelchair Mobility    Modified Rankin (Stroke Patients Only)       Balance Overall balance assessment: Needs assistance Sitting-balance support: Feet supported Sitting balance-Leahy Scale: Good     Standing balance support: During functional activity;Bilateral upper extremity supported Standing balance-Leahy Scale: Fair                Pertinent Vitals/Pain Pain Assessment: No/denies pain    Home Living Family/patient expects to be discharged to:: Private residence Living Arrangements: Alone Available Help at Discharge: Family Type of Home: House Home Access: Ramped entrance     Home Layout: One level Home Equipment: None;Cane - single point Additional Comments: pt is planning to stay at her sisters while she first recovers    Prior Function Level of Independence: Independent               Hand Dominance   Dominant Hand: Right    Extremity/Trunk Assessment   Upper Extremity Assessment Upper Extremity Assessment: Overall WFL for tasks assessed    Lower Extremity Assessment Lower Extremity Assessment: LLE deficits/detail LLE Deficits / Details: good quad activation, no extensor lag with SLR LLE Sensation: WNL LLE Coordination: WNL    Cervical / Trunk Assessment Cervical / Trunk Assessment: Normal  Communication   Communication: No difficulties  Cognition Arousal/Alertness: Awake/alert  Behavior During Therapy: WFL for tasks assessed/performed Overall Cognitive Status: Within Functional Limits for tasks assessed               General Comments      Exercises Total Joint Exercises Ankle Circles/Pumps: AROM;Both;20  reps;Seated Quad Sets: AROM;Left;Seated;10 reps Heel Slides: AROM;Left;10 reps;Seated   Assessment/Plan    PT Assessment Patient needs continued PT services  PT Problem List Decreased strength;Decreased range of motion;Decreased activity tolerance;Decreased balance;Decreased mobility;Decreased knowledge of use of DME;Decreased knowledge of precautions       PT Treatment Interventions DME instruction;Gait training;Functional mobility training;Stair training;Therapeutic activities;Therapeutic exercise;Balance training;Patient/family education    PT Goals (Current goals can be found in the Care Plan section)  Acute Rehab PT Goals Patient Stated Goal: get back to independece PT Goal Formulation: With patient Time For Goal Achievement: 05/25/20 Potential to Achieve Goals: Good    Frequency 7X/week   Barriers to discharge           AM-PAC PT "6 Clicks" Mobility  Outcome Measure Help needed turning from your back to your side while in a flat bed without using bedrails?: None Help needed moving from lying on your back to sitting on the side of a flat bed without using bedrails?: A Little Help needed moving to and from a bed to a chair (including a wheelchair)?: A Little Help needed standing up from a chair using your arms (e.g., wheelchair or bedside chair)?: A Little Help needed to walk in hospital room?: A Little Help needed climbing 3-5 steps with a railing? : A Little 6 Click Score: 19    End of Session Equipment Utilized During Treatment: Gait belt Activity Tolerance: Patient tolerated treatment well Patient left: in chair;with call bell/phone within reach;with chair alarm set;with family/visitor present Nurse Communication: Mobility status PT Visit Diagnosis: Muscle weakness (generalized) (M62.81);Difficulty in walking, not elsewhere classified (R26.2)    Time: 0051-1021 PT Time Calculation (min) (ACUTE ONLY): 29 min   Charges:   PT Evaluation $PT Eval Low Complexity:  1 Low PT Treatments $Therapeutic Exercise: 8-22 mins      Wynn Maudlin, DPT Acute Rehabilitation Services  Office [phone] Pager [phone]  05/18/2020 7:13 PM

## 2020-05-18 NOTE — Anesthesia Procedure Notes (Signed)
Spinal  Patient location during procedure: OR Start time: 05/18/2020 12:39 PM End time: 05/18/2020 12:44 PM Staffing Performed: anesthesiologist  Anesthesiologist: Lowella Curb, MD Preanesthetic Checklist Completed: patient identified, IV checked, site marked, risks and benefits discussed, surgical consent, monitors and equipment checked, pre-op evaluation and timeout performed Spinal Block Patient position: sitting Prep: DuraPrep Patient monitoring: heart rate, cardiac monitor, continuous pulse ox and blood pressure Approach: midline Location: L3-4 Injection technique: single-shot Needle Needle type: Sprotte  Needle gauge: 24 G Needle length: 9 cm Assessment Sensory level: T4

## 2020-05-18 NOTE — Transfer of Care (Signed)
Immediate Anesthesia Transfer of Care Note  Patient: Monique Wagner  Procedure(s) Performed: COMPUTER ASSISTED TOTAL KNEE ARTHROPLASTY (Left Knee)  Patient Location: PACU  Anesthesia Type:Spinal  Level of Consciousness: awake, alert  and oriented  Airway & Oxygen Therapy: Patient Spontanous Breathing and Patient connected to face mask oxygen  Post-op Assessment: Report given to RN and Post -op Vital signs reviewed and stable  Post vital signs: Reviewed and stable  Last Vitals:  Vitals Value Taken Time  BP 95/61 05/18/20 1550  Temp    Pulse 60 05/18/20 1554  Resp 22 05/18/20 1554  SpO2 100 % 05/18/20 1554  Vitals shown include unvalidated device data.  Last Pain:  Vitals:   05/18/20 1032  TempSrc: Oral      Patients Stated Pain Goal: 4 (05/18/20 1040)  Complications: No complications documented.

## 2020-05-19 ENCOUNTER — Encounter (HOSPITAL_COMMUNITY): Payer: Self-pay | Admitting: Orthopedic Surgery

## 2020-05-19 DIAGNOSIS — M1712 Unilateral primary osteoarthritis, left knee: Secondary | ICD-10-CM | POA: Diagnosis not present

## 2020-05-19 LAB — CBC
HCT: 32.2 % — ABNORMAL LOW (ref 36.0–46.0)
Hemoglobin: 10.3 g/dL — ABNORMAL LOW (ref 12.0–15.0)
MCH: 30.6 pg (ref 26.0–34.0)
MCHC: 32 g/dL (ref 30.0–36.0)
MCV: 95.5 fL (ref 80.0–100.0)
Platelets: 176 10*3/uL (ref 150–400)
RBC: 3.37 MIL/uL — ABNORMAL LOW (ref 3.87–5.11)
RDW: 13.1 % (ref 11.5–15.5)
WBC: 9.6 10*3/uL (ref 4.0–10.5)
nRBC: 0 % (ref 0.0–0.2)

## 2020-05-19 LAB — BASIC METABOLIC PANEL
Anion gap: 10 (ref 5–15)
BUN: 15 mg/dL (ref 8–23)
CO2: 25 mmol/L (ref 22–32)
Calcium: 8.3 mg/dL — ABNORMAL LOW (ref 8.9–10.3)
Chloride: 102 mmol/L (ref 98–111)
Creatinine, Ser: 1.12 mg/dL — ABNORMAL HIGH (ref 0.44–1.00)
GFR calc Af Amer: 54 mL/min — ABNORMAL LOW (ref >60)
GFR calc non Af Amer: 47 mL/min — ABNORMAL LOW (ref >60)
Glucose, Bld: 122 mg/dL — ABNORMAL HIGH (ref 70–99)
Potassium: 3.4 mmol/L — ABNORMAL LOW (ref 3.5–5.1)
Sodium: 137 mmol/L (ref 135–145)

## 2020-05-19 MED ORDER — SENNA 8.6 MG PO TABS: 1.0000 | ORAL_TABLET | Freq: Two times a day (BID) | ORAL | 0 refills | Status: DC

## 2020-05-19 MED ORDER — ONDANSETRON HCL 4 MG PO TABS: 4.0000 mg | ORAL_TABLET | Freq: Four times a day (QID) | ORAL | 0 refills | Status: DC | PRN

## 2020-05-19 MED ORDER — ASPIRIN 81 MG PO CHEW: 81.0000 mg | CHEWABLE_TABLET | Freq: Two times a day (BID) | ORAL | 0 refills | Status: AC

## 2020-05-19 MED ORDER — HYDROCODONE-ACETAMINOPHEN 5-325 MG PO TABS
1.0000 | ORAL_TABLET | ORAL | 0 refills | Status: AC | PRN
Start: 2020-05-19 — End: 2020-05-26

## 2020-05-19 MED ORDER — DOCUSATE SODIUM 100 MG PO CAPS: 100.0000 mg | ORAL_CAPSULE | Freq: Two times a day (BID) | ORAL | 0 refills | Status: DC

## 2020-05-19 NOTE — Plan of Care (Signed)
°  Problem: Education: Goal: Knowledge of General Education information will improve Description: Including pain rating scale, medication(s)/side effects and non-pharmacologic comfort measures Outcome: Progressing   Problem: Health Behavior/Discharge Planning: Goal: Ability to manage health-related needs will improve Outcome: Progressing   Problem: Clinical Measurements: Goal: Ability to maintain clinical measurements within normal limits will improve Outcome: Progressing Goal: Will remain free from infection Outcome: Progressing Goal: Diagnostic test results will improve Outcome: Progressing Goal: Respiratory complications will improve Outcome: Progressing Goal: Cardiovascular complication will be avoided Outcome: Progressing   Problem: Activity: Goal: Risk for activity intolerance will decrease Outcome: Progressing   Problem: Nutrition: Goal: Adequate nutrition will be maintained Outcome: Progressing   Problem: Coping: Goal: Level of anxiety will decrease Outcome: Progressing   Problem: Elimination: Goal: Will not experience complications related to bowel motility Outcome: Progressing Goal: Will not experience complications related to urinary retention Outcome: Progressing   Problem: Pain Managment: Goal: General experience of comfort will improve Outcome: Progressing   Problem: Safety: Goal: Ability to remain free from injury will improve Outcome: Progressing   Problem: Education: Goal: Knowledge of the prescribed therapeutic regimen will improve Outcome: Progressing Goal: Individualized Educational Video(s) Outcome: Progressing   Problem: Activity: Goal: Ability to avoid complications of mobility impairment will improve Outcome: Progressing Goal: Range of joint motion will improve Outcome: Progressing   Problem: Clinical Measurements: Goal: Postoperative complications will be avoided or minimized Outcome: Progressing   Problem: Pain Management: Goal:  Pain level will decrease with appropriate interventions Outcome: Progressing   Problem: Education: Goal: Knowledge of the prescribed therapeutic regimen will improve Outcome: Progressing Goal: Individualized Educational Video(s) Outcome: Progressing   Problem: Activity: Goal: Ability to avoid complications of mobility impairment will improve Outcome: Progressing Goal: Range of joint motion will improve Outcome: Progressing   Problem: Clinical Measurements: Goal: Postoperative complications will be avoided or minimized Outcome: Progressing   Problem: Pain Management: Goal: Pain level will decrease with appropriate interventions Outcome: Progressing

## 2020-05-19 NOTE — TOC Transition Note (Signed)
Transition of Care [facility]) - CM/SW Discharge Note   Patient Details  Name: Monique Wagner MRN: [id number] Date of Birth: August 04, 1941  [facility] CM/SW Contact:  Lennart Pall, LCSW Phone Number: 05/19/2020, 11:35 AM   Clinical Narrative:    Met with pt and sister this morning and confirming receipt of DME (see below) and plans for OPPT in [place].  No further TOC needs.   Final next level of care: OP Rehab Barriers to Discharge: No Barriers Identified   Patient Goals and CMS Choice Patient states their goals for this hospitalization and ongoing recovery are:: to dc today      Discharge Placement                       Discharge Plan and Services                DME Arranged: 3-N-1, Walker rolling DME Agency: [employer]     Representative spoke with at DME Agency: confirmed with Ovid Curd - orders placed prior to surgery HH Arranged: NA HH Agency: NA        Social Determinants of Health (SDOH) Interventions     Readmission Risk Interventions No flowsheet data found.

## 2020-05-19 NOTE — Progress Notes (Signed)
Physical Therapy Treatment Patient Details Name: Monique Wagner MRN: [id number] DOB: [birth year] Today's Date: 05/19/2020    History of Present Illness Patient is 79 y.o. female s/p Lt TKA on 05/18/20 with PMH significant for stroke, HLD, HTN, dyspnea, OA.    PT Comments    POD # 1 am session Assisted OOB to amb in hallway Then returned to room to perform some TE's following HEP handout.  Instructed on proper tech, freq as well as use of ICE.     Follow Up Recommendations  Follow surgeon's recommendation for DC plan and follow-up therapies;Outpatient PT     Equipment Recommendations  Rolling walker with 5" wheels;3in1 (PT)    Recommendations for Other Services       Precautions / Restrictions Precautions Precautions: Fall Precaution Comments: instructed no pillow under knee Restrictions Other Position/Activity Restrictions: WBAT    Mobility  Bed Mobility Overal bed mobility: Needs Assistance Bed Mobility: Supine to Sit;Sit to Supine           General bed mobility comments: cues to use bed rail, guarding for safety,  Transfers Overall transfer level: Needs assistance Equipment used: Rolling walker (2 wheeled) Transfers: Sit to/from Stand Sit to Stand: Min guard;Supervision         General transfer comment: VC's for safe technique with RW, Min assist to initaite power up and steady with rise.  Ambulation/Gait Ambulation/Gait assistance: Supervision;Min guard Gait Distance (Feet): 45 Feet Assistive device: Rolling walker (2 wheeled) Gait Pattern/deviations: Step-to pattern;Decreased stride length;Decreased stance time - left;Decreased weight shift to left Gait velocity: decr   General Gait Details: VC's for step pattern and proximity to RW. no overt LOB, no buckling at Lt knee. pt progressed to min guard for forward gait and min assist for turns.    Stairs             Wheelchair Mobility    Modified Rankin (Stroke Patients Only)       Balance                                             Cognition Arousal/Alertness: Awake/alert Behavior During Therapy: WFL for tasks assessed/performed Overall Cognitive Status: Within Functional Limits for tasks assessed                                        Exercises   Total Knee Replacement TE's following HEP handout 10 reps B LE ankle pumps 05 reps towel squeezes 05 reps knee presses 05 reps heel slides  05 reps SAQ's 05 reps SLR's 05 reps ABD Educated on use of gait belt to assist with TE's Followed by ICE     General Comments        Pertinent Vitals/Pain Pain Assessment: 0-10 Pain Score: 3  Pain Location: L knee Pain Descriptors / Indicators: Discomfort;Tender Pain Intervention(s): Monitored during session;Premedicated before session;Repositioned;Ice applied    Home Living                      Prior Function            PT Goals (current goals can now be found in the care plan section) Progress towards PT goals: Progressing toward goals    Frequency    7X/week  PT Plan Current plan remains appropriate    Co-evaluation              AM-PAC PT "6 Clicks" Mobility   Outcome Measure  Help needed turning from your back to your side while in a flat bed without using bedrails?: None Help needed moving from lying on your back to sitting on the side of a flat bed without using bedrails?: A Little Help needed moving to and from a bed to a chair (including a wheelchair)?: A Little Help needed standing up from a chair using your arms (e.g., wheelchair or bedside chair)?: A Little Help needed to walk in hospital room?: A Little Help needed climbing 3-5 steps with a railing? : A Little 6 Click Score: 19    End of Session Equipment Utilized During Treatment: Gait belt Activity Tolerance: Patient tolerated treatment well Patient left: in bed;with call bell/phone within reach Nurse Communication: Mobility status PT  Visit Diagnosis: Muscle weakness (generalized) (M62.81);Difficulty in walking, not elsewhere classified (R26.2)     Time: 0935-1000 PT Time Calculation (min) (ACUTE ONLY): 25 min  Charges:  $Gait Training: 8-22 mins $Therapeutic Exercise: 8-22 mins                     Felecia Shelling  PTA Acute  Rehabilitation Services Pager      [phone] Office      [phone]

## 2020-05-19 NOTE — Plan of Care (Signed)
Problem: Education: Goal: Knowledge of General Education information will improve Description: Including pain rating scale, medication(s)/side effects and non-pharmacologic comfort measures 05/19/2020 1854 by Iantha Fallen, RN Outcome: Adequate for Discharge 05/19/2020 1853 by Iantha Fallen, RN Outcome: Progressing   Problem: Health Behavior/Discharge Planning: Goal: Ability to manage health-related needs will improve 05/19/2020 1854 by Iantha Fallen, RN Outcome: Adequate for Discharge 05/19/2020 1853 by Iantha Fallen, RN Outcome: Progressing   Problem: Clinical Measurements: Goal: Ability to maintain clinical measurements within normal limits will improve 05/19/2020 1854 by Iantha Fallen, RN Outcome: Adequate for Discharge 05/19/2020 1853 by Iantha Fallen, RN Outcome: Progressing Goal: Will remain free from infection 05/19/2020 1854 by Iantha Fallen, RN Outcome: Adequate for Discharge 05/19/2020 1853 by Iantha Fallen, RN Outcome: Progressing Goal: Diagnostic test results will improve 05/19/2020 1854 by Iantha Fallen, RN Outcome: Adequate for Discharge 05/19/2020 1853 by Iantha Fallen, RN Outcome: Progressing Goal: Respiratory complications will improve 05/19/2020 1854 by Iantha Fallen, RN Outcome: Adequate for Discharge 05/19/2020 1853 by Iantha Fallen, RN Outcome: Progressing Goal: Cardiovascular complication will be avoided 05/19/2020 1854 by Iantha Fallen, RN Outcome: Adequate for Discharge 05/19/2020 1853 by Iantha Fallen, RN Outcome: Progressing   Problem: Activity: Goal: Risk for activity intolerance will decrease 05/19/2020 1854 by Iantha Fallen, RN Outcome: Adequate for Discharge 05/19/2020 1853 by Iantha Fallen, RN Outcome: Progressing   Problem: Nutrition: Goal: Adequate nutrition will be maintained 05/19/2020 1854 by Iantha Fallen, RN Outcome: Adequate for Discharge 05/19/2020 1853 by Iantha Fallen, RN Outcome: Progressing    Problem: Coping: Goal: Level of anxiety will decrease 05/19/2020 1854 by Iantha Fallen, RN Outcome: Adequate for Discharge 05/19/2020 1853 by Iantha Fallen, RN Outcome: Progressing   Problem: Elimination: Goal: Will not experience complications related to bowel motility 05/19/2020 1854 by Iantha Fallen, RN Outcome: Adequate for Discharge 05/19/2020 1853 by Iantha Fallen, RN Outcome: Progressing Goal: Will not experience complications related to urinary retention 05/19/2020 1854 by Iantha Fallen, RN Outcome: Adequate for Discharge 05/19/2020 1853 by Iantha Fallen, RN Outcome: Progressing   Problem: Pain Managment: Goal: General experience of comfort will improve 05/19/2020 1854 by Iantha Fallen, RN Outcome: Adequate for Discharge 05/19/2020 1853 by Iantha Fallen, RN Outcome: Progressing   Problem: Safety: Goal: Ability to remain free from injury will improve 05/19/2020 1854 by Iantha Fallen, RN Outcome: Adequate for Discharge 05/19/2020 1853 by Iantha Fallen, RN Outcome: Progressing   Problem: Education: Goal: Knowledge of the prescribed therapeutic regimen will improve 05/19/2020 1854 by Iantha Fallen, RN Outcome: Adequate for Discharge 05/19/2020 1853 by Iantha Fallen, RN Outcome: Progressing Goal: Individualized Educational Video(s) 05/19/2020 1854 by Iantha Fallen, RN Outcome: Adequate for Discharge 05/19/2020 1853 by Iantha Fallen, RN Outcome: Progressing   Problem: Activity: Goal: Ability to avoid complications of mobility impairment will improve 05/19/2020 1854 by Iantha Fallen, RN Outcome: Adequate for Discharge 05/19/2020 1853 by Iantha Fallen, RN Outcome: Progressing Goal: Range of joint motion will improve 05/19/2020 1854 by Iantha Fallen, RN Outcome: Adequate for Discharge 05/19/2020 1853 by Iantha Fallen, RN Outcome: Progressing   Problem: Clinical Measurements: Goal: Postoperative complications will be avoided or  minimized 05/19/2020 1854 by Iantha Fallen, RN Outcome: Adequate for Discharge 05/19/2020 1853 by Iantha Fallen, RN Outcome: Progressing   Problem: Pain Management: Goal: Pain level will decrease with appropriate interventions 05/19/2020 1854  by Iantha Fallen, RN Outcome: Adequate for Discharge 05/19/2020 1853 by Iantha Fallen, RN Outcome: Progressing   Problem: Education: Goal: Knowledge of the prescribed therapeutic regimen will improve 05/19/2020 1854 by Iantha Fallen, RN Outcome: Adequate for Discharge 05/19/2020 1853 by Iantha Fallen, RN Outcome: Progressing Goal: Individualized Educational Video(s) 05/19/2020 1854 by Iantha Fallen, RN Outcome: Adequate for Discharge 05/19/2020 1853 by Iantha Fallen, RN Outcome: Progressing   Problem: Activity: Goal: Ability to avoid complications of mobility impairment will improve 05/19/2020 1854 by Iantha Fallen, RN Outcome: Adequate for Discharge 05/19/2020 1853 by Iantha Fallen, RN Outcome: Progressing Goal: Range of joint motion will improve 05/19/2020 1854 by Iantha Fallen, RN Outcome: Adequate for Discharge 05/19/2020 1853 by Iantha Fallen, RN Outcome: Progressing   Problem: Clinical Measurements: Goal: Postoperative complications will be avoided or minimized 05/19/2020 1854 by Iantha Fallen, RN Outcome: Adequate for Discharge 05/19/2020 1853 by Iantha Fallen, RN Outcome: Progressing   Problem: Pain Management: Goal: Pain level will decrease with appropriate interventions 05/19/2020 1854 by Iantha Fallen, RN Outcome: Adequate for Discharge 05/19/2020 1853 by Iantha Fallen, RN Outcome: Progressing

## 2020-05-19 NOTE — Discharge Summary (Signed)
Physician Discharge Summary  Patient ID: ANNALEESE GUIER MRN: [id number] DOB/AGE: January 24, 1941 79 y.o.  Admit date: 05/18/2020 Discharge date: 05/19/2020  Admission Diagnoses:  Osteoarthritis of left knee  Discharge Diagnoses:  Principal Problem:   Osteoarthritis of left knee   Past Medical History:  Diagnosis Date  . Arthritis   . Diverticulitis   . Dyspnea   . HTN (hypertension)   . Hyperlipidemia   . Stroke (HCC)    slight stroke 50 years ago - no deficits     Surgeries: Procedure(s): COMPUTER ASSISTED TOTAL KNEE ARTHROPLASTY on 05/18/2020   Consultants (if any):   Discharged Condition: Improved  Hospital Course: DOREATHER HOXWORTH is an 79 y.o. female who was admitted 05/18/2020 with a diagnosis of Osteoarthritis of left knee and went to the operating room on 05/18/2020 and underwent the above named procedures.    She was given perioperative antibiotics:  Anti-infectives (From admission, onward)   Start     Dose/Rate Route Frequency Ordered Stop   05/18/20 2000  ceFAZolin (ANCEF) IVPB 2g/100 mL premix        2 g 200 mL/hr over 30 Minutes Intravenous Every 6 hours 05/18/20 1712 05/19/20 0315   05/18/20 1015  ceFAZolin (ANCEF) IVPB 2g/100 mL premix        2 g 200 mL/hr over 30 Minutes Intravenous On call to O.R. 05/18/20 1002 05/18/20 1326    .  She was given sequential compression devices, early ambulation, and ASA for DVT prophylaxis.  She benefited maximally from the hospital stay and there were no complications.    Recent vital signs:  Vitals:   05/19/20 0951 05/19/20 1354  BP: (!) 116/57 (!) 111/56  Pulse: 75 82  Resp: 18 16  Temp:  98.7 F (37.1 C)  SpO2: 99% 99%    Recent laboratory studies:  Lab Results  Component Value Date   HGB 10.3 (L) 05/19/2020   HGB 13.7 05/11/2020   HGB 12.6 07/04/2018   Lab Results  Component Value Date   WBC 9.6 05/19/2020   PLT 176 05/19/2020   Lab Results  Component Value Date   INR 1.0 05/11/2020   Lab Results   Component Value Date   NA 137 05/19/2020   K 3.4 (L) 05/19/2020   CL 102 05/19/2020   CO2 25 05/19/2020   BUN 15 05/19/2020   CREATININE 1.12 (H) 05/19/2020   GLUCOSE 122 (H) 05/19/2020    Discharge Medications:   Allergies as of 05/19/2020   No Known Allergies     Medication List    STOP taking these medications   acetaminophen 500 MG tablet Commonly known as: TYLENOL     TAKE these medications   aspirin 81 MG chewable tablet Chew 1 tablet (81 mg total) by mouth 2 (two) times daily.   cholecalciferol 25 MCG (1000 UNIT) tablet Commonly known as: VITAMIN D3 Take 1,000 Units by mouth daily.   DANDELION PO Take 1 capsule by mouth daily.   docusate sodium 100 MG capsule Commonly known as: COLACE Take 1 capsule (100 mg total) by mouth 2 (two) times daily.   dorzolamide-timolol 22.3-6.8 MG/ML ophthalmic solution Commonly known as: COSOPT Place 1 drop into both eyes 2 (two) times daily.   ezetimibe 10 MG tablet Commonly known as: ZETIA Take 10 mg by mouth daily.   fenofibrate 48 MG tablet Commonly known as: TRICOR Take 48 mg by mouth daily.   Fish Oil 1000 MG Caps Take 1,000 mg by mouth daily.  HYDROcodone-acetaminophen 5-325 MG tablet Commonly known as: NORCO/VICODIN Take 1 tablet by mouth every 4 (four) hours as needed for up to 7 days for moderate pain (pain score 4-6).   Milk Thistle 150 MG Caps Take 150 mg by mouth daily.   MULTIVITAMIN PO Take 1 tablet by mouth daily.   ondansetron 4 MG tablet Commonly known as: ZOFRAN Take 1 tablet (4 mg total) by mouth every 6 (six) hours as needed for nausea.   predniSONE 10 MG tablet Commonly known as: DELTASONE Take 10 mg by mouth daily as needed (for knee/hand pain).   Probiotic Caps Take 1 capsule by mouth 2 (two) times daily.   Rocklatan 0.02-0.005 % Soln Generic drug: Netarsudil-Latanoprost Place 1 drop into both eyes daily.   senna 8.6 MG Tabs tablet Commonly known as: SENOKOT Take 1 tablet  (8.6 mg total) by mouth 2 (two) times daily.   telmisartan-hydrochlorothiazide 40-12.5 MG tablet Commonly known as: MICARDIS HCT Take 1 tablet by mouth daily.   Turmeric 450 MG Caps Take 450 mg by mouth daily.       Diagnostic Studies: DG Knee Left Port  Result Date: 05/18/2020 CLINICAL DATA:  Left knee replacement. EXAM: PORTABLE LEFT KNEE - 1-2 VIEW COMPARISON:  None. FINDINGS: The left knee demonstrates a total knee arthroplasty without evidence of hardware failure or complication. There is expected intra-articular air. There is no fracture or dislocation. The alignment is anatomic. Post-surgical changes noted in the surrounding soft tissues. IMPRESSION: 1. Left total knee arthroplasty without evidence of acute postoperative complication. Electronically Signed   By: Obie Dredge M.D.   On: 05/18/2020 16:46    Disposition: Discharge disposition: 01-Home or Self Care       Discharge Instructions    Call MD / Call 911   Complete by: As directed    If you experience chest pain or shortness of breath, CALL 911 and be transported to the hospital emergency room.  If you develope a fever above 101 F, pus (white drainage) or increased drainage or redness at the wound, or calf pain, call your surgeon's office.   Constipation Prevention   Complete by: As directed    Drink plenty of fluids.  Prune juice may be helpful.  You may use a stool softener, such as Colace (over the counter) 100 mg twice a day.  Use MiraLax (over the counter) for constipation as needed.   Diet - low sodium heart healthy   Complete by: As directed    Do not put a pillow under the knee. Place it under the heel.   Complete by: As directed    Driving restrictions   Complete by: As directed    No driving for 6  weeks   Increase activity slowly as tolerated   Complete by: As directed    Lifting restrictions   Complete by: As directed    No lifting for 6 weeks   TED hose   Complete by: As directed    Use  stockings (TED hose) for 2 weeks on both leg(s).  You may remove them at night for sleeping.       Follow-up Information    Venkat Ankney, Arlys John, MD. Schedule an appointment as soon as possible for a visit in 2 weeks.   Specialty: Orthopedic Surgery Why: For wound re-check Contact information: [address] [place] [place] [address] [phone]                Signed: Iline Oven Kimiko Common 05/19/2020, 4:40  PM

## 2020-05-19 NOTE — Progress Notes (Signed)
    Subjective:  Patient reports pain as mild to moderate.  Denies N/V/CP/SOB.   Objective:   VITALS:   Vitals:   05/19/20 0159 05/19/20 0626 05/19/20 0951 05/19/20 1354  BP: (!) 105/56 (!) 127/44 (!) 116/57 (!) 111/56  Pulse: 66 71 75 82  Resp: 15 15 18 16   Temp: 98.7 F (37.1 C) 98.4 F (36.9 C)  98.7 F (37.1 C)  TempSrc: Oral   Oral  SpO2: 100% 97% 99% 99%  Weight:      Height:        NAD ABD soft Neurovascular intact Sensation intact distally Intact pulses distally Dorsiflexion/Plantar flexion intact Incision: dressing C/D/I   Lab Results  Component Value Date   WBC 9.6 05/19/2020   HGB 10.3 (L) 05/19/2020   HCT 32.2 (L) 05/19/2020   MCV 95.5 05/19/2020   PLT 176 05/19/2020   BMET    Component Value Date/Time   NA 137 05/19/2020 0246   K 3.4 (L) 05/19/2020 0246   CL 102 05/19/2020 0246   CO2 25 05/19/2020 0246   GLUCOSE 122 (H) 05/19/2020 0246   BUN 15 05/19/2020 0246   CREATININE 1.12 (H) 05/19/2020 0246   CALCIUM 8.3 (L) 05/19/2020 0246   GFRNONAA 47 (L) 05/19/2020 0246   GFRAA 54 (L) 05/19/2020 0246     Assessment/Plan: 1 Day Post-Op   Principal Problem:   Osteoarthritis of left knee   WBAT with walker DVT ppx: Aspirin, SCDs, TEDS PO pain control PT/OT Dispo: D/C home     05/21/2020 05/19/2020, 2:30 PM    [facility] is now [facility] [address], [place], [place] Phone: [phone] [email or web address] Facebook  [phone]

## 2020-05-19 NOTE — Progress Notes (Signed)
Physical Therapy Treatment Patient Details Name: Monique Wagner MRN: [id number] DOB: [birth year] Today's Date: 05/19/2020    History of Present Illness Patient is 79 y.o. female s/p Lt TKA on 05/18/20 with PMH significant for stroke, HLD, HTN, dyspnea, OA.    PT Comments    POD # 1 pm session Sister present during session Assisted with amb an increased distance in hallway then returned to room to complete TE's HEP.  Addressed all mobility questions, discussed appropriate activity, educated on use of ICE.  Pt ready for D/C to home.   Follow Up Recommendations  Follow surgeons recommendation for DC plan and follow-up therapies;Outpatient PT     Equipment Recommendations  Rolling walker with 5" wheels;3in1 (PT)    Recommendations for Other Services       Precautions / Restrictions Precautions Precautions: Fall Precaution Comments: instructed no pillow under knee Restrictions Other Position/Activity Restrictions: WBAT    Mobility  Bed Mobility Overal bed mobility: Needs Assistance Bed Mobility: Supine to Sit;Sit to Supine           General bed mobility comments: cues to use bed rail, guarding for safety,  Transfers Overall transfer level: Needs assistance Equipment used: Rolling walker (2 wheeled) Transfers: Sit to/from Stand Sit to Stand: Min guard;Supervision         General transfer comment: VC's for safe technique with RW, Min assist to initaite power up and steady with rise.  Ambulation/Gait Ambulation/Gait assistance: Supervision;Min guard Gait Distance (Feet): 55 Feet Assistive device: Rolling walker (2 wheeled) Gait Pattern/deviations: Step-to pattern;Decreased stride length;Decreased stance time - left;Decreased weight shift to left Gait velocity: decr   General Gait Details: amb with sister with instruction on safe handling   Stairs             Wheelchair Mobility    Modified Rankin (Stroke Patients Only)       Balance                                             Cognition Arousal/Alertness: Awake/alert Behavior During Therapy: WFL for tasks assessed/performed Overall Cognitive Status: Within Functional Limits for tasks assessed                                        Exercises  seated TKR TE's following HEP    General Comments        Pertinent Vitals/Pain Pain Assessment: 0-10 Pain Score: 3  Pain Location: L knee Pain Descriptors / Indicators: Discomfort;Tender Pain Intervention(s): Monitored during session;Premedicated before session;Repositioned;Ice applied    Home Living                      Prior Function            PT Goals (current goals can now be found in the care plan section) Progress towards PT goals: Progressing toward goals    Frequency    7X/week      PT Plan Current plan remains appropriate    Co-evaluation              AM-PAC PT "6 Clicks" Mobility   Outcome Measure  Help needed turning from your back to your side while in a flat bed without using bedrails?: None Help needed moving from lying on  your back to sitting on the side of a flat bed without using bedrails?: A Little Help needed moving to and from a bed to a chair (including a wheelchair)?: A Little Help needed standing up from a chair using your arms (e.g., wheelchair or bedside chair)?: A Little Help needed to walk in hospital room?: A Little Help needed climbing 3-5 steps with a railing? : A Little 6 Click Score: 19    End of Session Equipment Utilized During Treatment: Gait belt Activity Tolerance: Patient tolerated treatment well Patient left: in bed;with call bell/phone within reach Nurse Communication: Mobility status PT Visit Diagnosis: Muscle weakness (generalized) (M62.81);Difficulty in walking, not elsewhere classified (R26.2)     Time: 8119-1478 PT Time Calculation (min) (ACUTE ONLY): 25 min  Charges:  $Gait Training: 8-22 mins $Therapeutic  Exercise: 8-22 mins                     Felecia Shelling  PTA Acute  Rehabilitation Services Pager      [phone] Office      [phone]

## 2020-05-25 ENCOUNTER — Other Ambulatory Visit: Payer: Self-pay

## 2020-05-25 ENCOUNTER — Ambulatory Visit (HOSPITAL_COMMUNITY): Payer: Medicare Other | Attending: Orthopedic Surgery | Admitting: Physical Therapy

## 2020-05-25 DIAGNOSIS — G8929 Other chronic pain: Secondary | ICD-10-CM | POA: Diagnosis present

## 2020-05-25 DIAGNOSIS — M25562 Pain in left knee: Secondary | ICD-10-CM | POA: Insufficient documentation

## 2020-05-25 DIAGNOSIS — M25662 Stiffness of left knee, not elsewhere classified: Secondary | ICD-10-CM | POA: Insufficient documentation

## 2020-05-25 DIAGNOSIS — R262 Difficulty in walking, not elsewhere classified: Secondary | ICD-10-CM | POA: Insufficient documentation

## 2020-05-25 NOTE — Therapy (Signed)
[facility] [address] [place], [place], [address] Phone: [phone]   Fax:  [phone]  Physical Therapy Evaluation  Patient Details  Name: Monique Wagner MRN: [id number] Date of Birth: 1941-06-13 Referring Provider (PT): Samson Frederic   Encounter Date: 05/25/2020   PT End of Session - 05/25/20 1307    Visit Number 1    Number of Visits 18    Date for PT Re-Evaluation 07/06/20    Authorization Type BCBS  No auth, no VL, CP $25    Progress Note Due on Visit 10    PT Start Time 1309    PT Stop Time 1349    PT Time Calculation (min) 40 min           Past Medical History:  Diagnosis Date  . Arthritis   . Diverticulitis   . Dyspnea   . HTN (hypertension)   . Hyperlipidemia   . Stroke (HCC)    slight stroke 50 years ago - no deficits     Past Surgical History:  Procedure Laterality Date  . ABDOMINAL HYSTERECTOMY    . BREAST SURGERY    . 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        [facility] PT Assessment - 05/25/20 0001      Assessment   Medical Diagnosis L TKA    Referring Provider (PT) Arlys John Swinteck    Onset Date/Surgical Date 05/18/20    Next MD Visit 05/31/20    Prior Therapy in the hospital       Restrictions   Weight Bearing Restrictions Yes    LLE Weight Bearing Weight bearing as tolerated      Balance Screen   Has the patient fallen in the past 6 months No    Has the patient had a decrease in activity level because of a fear of falling?  Yes      Home Environment   Living Environment Private residence    Living Arrangements Alone    Available Help at Discharge Family    Type of Home House   currently living with sister    Home Access Stairs to enter    Entrance Stairs-Number of Steps 1   working on a little  ramp   Home Layout One level    Home Equipment Walker - 2 wheels   walk in shower but no shower chair     Prior Function   Level of Independence Independent with community mobility with device;Independent with household mobility without device      Cognition   Overall Cognitive Status Within Functional Limits for tasks assessed      Observation/Other Assessments   Observations ted hoes on both legs - bandage over incision    Skin Integrity unable to assess - bandage over knee    Focus on Therapeutic Outcomes (FOTO)  38% function      Observation/Other Assessments-Edema    Edema Circumferential   R 44.0 cm L 46.4 - joint line at knee     ROM / Strength   AROM / PROM / Strength AROM;Strength      AROM   AROM Assessment Site Knee    Right/Left Knee Right;Left    Right Knee Extension 0    Right Knee Flexion 90   pain  Left Knee Extension 0    Left Knee Flexion 68   PROM 78     Strength   Overall Strength Comments able to perform 5 SLR on L without extension lag       Transfers   Transfers Stand to Sit;Sit to Stand    Sit to Stand 6: Modified independent (Device/Increase time);With upper extremity assist   kicks left out to sit   Stand to Sit With upper extremity assist;6: Modified independent (Device/Increase time)   favors right      Ambulation/Gait   Ambulation/Gait Yes    Ambulation/Gait Assistance 6: Modified independent (Device/Increase time)    Ambulation Distance (Feet) 330 Feet    Assistive device Rolling walker    Gait Pattern Decreased hip/knee flexion - left;Decreased hip/knee flexion - right;Decreased dorsiflexion - left;Decreased dorsiflexion - right;Trunk flexed    Ambulation Surface Level;Indoor    Gait Comments                       Objective measurements completed on examination: See above findings.       [facility] Adult PT Treatment/Exercise - 05/25/20 0001      Exercises   Exercises Knee/Hip      Knee/Hip Exercises: Supine   Heel  Slides AROM;Left;10 reps;2 sets   5" holds                 PT Education - 05/25/20 1349    Education Details Educated patient on elevation and keeping leg straight but knee above heart. Educated patient on importance of knee ROM and focus on bending knee.    Person(s) Educated Patient    Methods Explanation    Comprehension Verbalized understanding            PT Short Term Goals - 05/25/20 1357      PT SHORT TERM GOAL #1   Title Patient will be independent in self management strategies to improve quality of life and functional outcomes.    Time 3    Period Weeks    Status New    Target Date 06/15/20      PT SHORT TERM GOAL #2   Title Patient will be able to ambulate 226 feet in 2 minutes without AD to demonstrate improved walking mechanics    Time 3    Period Weeks    Status New    Target Date 06/15/20      PT SHORT TERM GOAL #3   Title Patient wil be able to transition from sit <-> stand without UE assist    Time 3    Period Weeks    Status New    Target Date 06/15/20             PT Long Term Goals - 05/25/20 1358      PT LONG TERM GOAL #1   Title Patient will be able to ascend and descend steps with use of railing as needed with reciprocal gait pattern to improve transitional mobility.    Time 6    Period Weeks    Status New    Target Date 07/06/20      PT LONG TERM GOAL #2   Title Patient will improve on FOTO score to meet predicted outcomes to demonstrate improved functional mobility.    Time 6    Period Weeks    Status New    Target Date 07/06/20      PT LONG TERM GOAL #3  Title Patient will report at least 50% improvement in overall symptoms and/or function to demonstrate improved functional mobility    Time 6    Period Weeks    Status New    Target Date 07/06/20      PT LONG TERM GOAL #4   Title Patient will demonstate 0-110 degress of left knee ROM    Time 6    Period Weeks    Status New    Target Date 07/06/20                   Plan - 05/25/20 1349    Clinical Impression Statement Patient is s/p left TKA on 05/18/20. She demonstrates good functional mobility but is severely limited in knee flexion, but right knee is also severely limited in knee ROM. Educated patient on importance of focusing on knee ROM at home as well as elevation secondary to edema in leg. Patient would greatly benefit from skilled physical therapy to improve overall functional mobility and allow patient to safely live independently at home.    Personal Factors and Comorbidities Comorbidity 1    Comorbidities bilateral kne epain    Examination-Activity Limitations Bed Mobility;Bathing;Sit;Transfers;Locomotion Level;Stand;Stairs;Squat;Carry;Bend    Examination-Participation Restrictions Meal Prep;Driving;Shop;Community Activity;Cleaning    Stability/Clinical Decision Making Stable/Uncomplicated    Clinical Decision Making Low    Rehab Potential Good    PT Frequency 3x / week    PT Duration 6 weeks    PT Treatment/Interventions ADLs/Self Care Home Management;Aquatic Therapy;Cryotherapy;Electrical Stimulation;Iontophoresis 4mg /ml Dexamethasone;Moist Heat;Traction;Balance training;Therapeutic exercise;Therapeutic activities;Functional mobility training;Stair training;Gait training;DME Instruction;Neuromuscular re-education;Patient/family education;Manual techniques;Dry needling;Passive range of motion;Scar mobilization    PT Next Visit Plan Focus on knee ROM (flexion), follow up with elevation    PT Home Exercise Plan heel slides, elevation    Consulted and Agree with Plan of Care Patient           Patient will benefit from skilled therapeutic intervention in order to improve the following deficits and impairments:  Abnormal gait, Decreased endurance, Decreased skin integrity, Pain, Decreased strength, Decreased activity tolerance, Difficulty walking, Decreased mobility, Decreased balance, Decreased range of motion, Decreased safety  awareness, Decreased knowledge of use of DME, Increased edema  Visit Diagnosis: Chronic pain of left knee  Decreased range of motion (ROM) of left knee  Difficulty in walking, not elsewhere classified     Problem List Patient Active Problem List   Diagnosis Date Noted  . Osteoarthritis of left knee 05/18/2020  . Anemia   . Lower abdominal pain   . Diverticulitis 08/28/2017  . Pain in the chest   . Chest pain 09/03/2014  . HTN (hypertension) 09/03/2014  . Hyperlipidemia 09/03/2014  . Pain in joint, shoulder region 07/10/2012  . 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  . Osteoarthrosis, unspecified whether generalized or localized, lower leg 04/21/2008  . KNEE PAIN 04/21/2008  . HIGH BLOOD PRESSURE 04/20/2008    2:03 PM, 05/25/20 05/27/20, DPT Physical Therapy with [facility]  [phone] office  [facility] [address] [place], [place], [place] Phone: [phone]   Fax:  [phone]  Name: Monique Wagner MRN: [id number] Radar Date of Birth: 1941/03/18

## 2020-05-27 ENCOUNTER — Other Ambulatory Visit: Payer: Self-pay

## 2020-05-27 ENCOUNTER — Ambulatory Visit (HOSPITAL_COMMUNITY): Payer: Medicare Other

## 2020-05-27 ENCOUNTER — Encounter (HOSPITAL_COMMUNITY): Payer: Self-pay

## 2020-05-27 DIAGNOSIS — M25562 Pain in left knee: Secondary | ICD-10-CM

## 2020-05-27 DIAGNOSIS — M25662 Stiffness of left knee, not elsewhere classified: Secondary | ICD-10-CM

## 2020-05-27 DIAGNOSIS — R262 Difficulty in walking, not elsewhere classified: Secondary | ICD-10-CM

## 2020-05-27 NOTE — Therapy (Signed)
[facility] [address] [place], [place], [address] Phone: [phone]   Fax:  [phone]  Physical Therapy Treatment  Patient Details  Name: Monique Wagner MRN: [id number] Date of Birth: 04/10/1941 Referring Provider (PT): Samson Frederic   Encounter Date: 05/27/2020   PT End of Session - 05/27/20 0901    Visit Number 2    Number of Visits 18    Date for PT Re-Evaluation 07/06/20    Authorization Type BCBS  No auth, no VL, CP $25    Progress Note Due on Visit 10    PT Start Time 0859    PT Stop Time 0939    PT Time Calculation (min) 40 min    Activity Tolerance Patient tolerated treatment well    Behavior During Therapy [facility] for tasks assessed/performed           Past Medical History:  Diagnosis Date  . Arthritis   . Diverticulitis   . Dyspnea   . HTN (hypertension)   . Hyperlipidemia   . Stroke (HCC)    slight stroke 50 years ago - no deficits     Past Surgical History:  Procedure Laterality Date  . ABDOMINAL HYSTERECTOMY    . BREAST SURGERY    . 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   Subjective Assessment - 05/27/20 0903    Subjective Pt reports a little nauseas today and exercises are going well. Pt reports elevating LLE during the day but not at night.    Currently in Pain? No/denies                   [facility] Adult PT Treatment/Exercise - 05/27/20 0001      Ambulation/Gait   Ambulation/Gait Yes    Ambulation/Gait Assistance 6: Modified independent (Device/Increase time)    Assistive device Rolling walker    Gait Pattern Decreased hip/knee flexion - left;Decreased dorsiflexion - left    Gait Comments cues for improve knee flexion in swing, heel strike on L foot      Knee/Hip Exercises: Seated   Heel Slides  Left;10 reps    Heel Slides Limitations RLE providing overpressure, 5 sec hold    Sit to Sand 10 reps;without UE support   from elevated mat table     Knee/Hip Exercises: Supine   Quad Sets Left;15 reps    Quad Sets Limitations 3 sec isometric hold    Heel Slides Left;10 reps    Heel Slides Limitations 5 sec hold, therapist holding at end range    Straight Leg Raises Left;15 reps    Knee Extension Left;AROM    Knee Extension Limitations 4    Knee Flexion Left;AROM    Knee Flexion Limitations 78                  PT Education - 05/27/20 0908    Education Details Reviewed goals, exercise technique, continue HEP, continue RICE for swelling/pain management    Person(s) Educated Patient    Methods Explanation    Comprehension Verbalized understanding;Returned demonstration            PT Short Term Goals - 05/27/20 0902      PT SHORT TERM GOAL #1   Title Patient will be independent in self management strategies to improve quality of  life and functional outcomes.    Time 3    Period Weeks    Status On-going    Target Date 06/15/20      PT SHORT TERM GOAL #2   Title Patient will be able to ambulate 226 feet in 2 minutes without AD to demonstrate improved walking mechanics    Time 3    Period Weeks    Status On-going    Target Date 06/15/20      PT SHORT TERM GOAL #3   Title Patient wil be able to transition from sit <-> stand without UE assist    Time 3    Period Weeks    Status On-going    Target Date 06/15/20             PT Long Term Goals - 05/27/20 0903      PT LONG TERM GOAL #1   Title Patient will be able to ascend and descend steps with use of railing as needed with reciprocal gait pattern to improve transitional mobility.    Time 6    Period Weeks    Status On-going      PT LONG TERM GOAL #2   Title Patient will improve on FOTO score to meet predicted outcomes to demonstrate improved functional mobility.    Time 6    Period Weeks    Status  On-going      PT LONG TERM GOAL #3   Title Patient will report at least 50% improvement in overall symptoms and/or function to demonstrate improved functional mobility    Time 6    Period Weeks    Status On-going      PT LONG TERM GOAL #4   Title Patient will demonstate 0-110 degress of left knee ROM    Time 6    Period Weeks    Status On-going                 Plan - 05/27/20 4097    Clinical Impression Statement Pt tolerates heel slides to improve knee ROM with RLE or therapist providing overpressure at end range. Pt able to perform STS reps from elevated mat table, maintaining equal BLE weight-bearing and pushing through bil flat feet. Pt ambulates with RW using good step-through pattern, demonstrating decreased L knee flexion in swing, decreased heel strike, and fatigues with distance. Educated pt on importance of knee flexion and performing heel slides hourly and continuing ice for decreased swelling/inflammation to help improve flexion. Continue to progress as able.    Personal Factors and Comorbidities Comorbidity 1    Comorbidities bilateral kne epain    Examination-Activity Limitations Bed Mobility;Bathing;Sit;Transfers;Locomotion Level;Stand;Stairs;Squat;Carry;Bend    Examination-Participation Restrictions Meal Prep;Driving;Shop;Community Activity;Cleaning    Stability/Clinical Decision Making Stable/Uncomplicated    Rehab Potential Good    PT Frequency 3x / week    PT Duration 6 weeks    PT Treatment/Interventions ADLs/Self Care Home Management;Aquatic Therapy;Cryotherapy;Electrical Stimulation;Iontophoresis 4mg /ml Dexamethasone;Moist Heat;Traction;Balance training;Therapeutic exercise;Therapeutic activities;Functional mobility training;Stair training;Gait training;DME Instruction;Neuromuscular re-education;Patient/family education;Manual techniques;Dry needling;Passive range of motion;Scar mobilization    PT Next Visit Plan Continue knee ROM and strengthening exercises.  Edema management to reduce swelling and improve knee flexion.    PT Home Exercise Plan heel slides, elevation    Consulted and Agree with Plan of Care Patient           Patient will benefit from skilled therapeutic intervention in order to improve the following deficits and impairments:  Abnormal gait, Decreased endurance, Decreased skin integrity,  Pain, Decreased strength, Decreased activity tolerance, Difficulty walking, Decreased mobility, Decreased balance, Decreased range of motion, Decreased safety awareness, Decreased knowledge of use of DME, Increased edema  Visit Diagnosis: Chronic pain of left knee  Decreased range of motion (ROM) of left knee  Difficulty in walking, not elsewhere classified     Problem List Patient Active Problem List   Diagnosis Date Noted  . Osteoarthritis of left knee 05/18/2020  . Anemia   . Lower abdominal pain   . Diverticulitis 08/28/2017  . Pain in the chest   . Chest pain 09/03/2014  . HTN (hypertension) 09/03/2014  . Hyperlipidemia 09/03/2014  . Pain in joint, shoulder region 07/10/2012  . 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  . Osteoarthrosis, unspecified whether generalized or localized, lower leg 04/21/2008  . KNEE PAIN 04/21/2008  . HIGH BLOOD PRESSURE 04/20/2008    Domenick Bookbinder PT, DPT 05/27/20, 9:45 AM [phone]  [facility] [address] [place], [place], [address] Phone: [phone]   Fax:  [phone]  Name: KAESHA KIRSCH MRN: [id number] Date of Birth: 1941-05-05

## 2020-05-30 ENCOUNTER — Other Ambulatory Visit: Payer: Self-pay

## 2020-05-30 ENCOUNTER — Ambulatory Visit (HOSPITAL_COMMUNITY): Payer: Medicare Other | Admitting: Physical Therapy

## 2020-05-30 DIAGNOSIS — M25562 Pain in left knee: Secondary | ICD-10-CM

## 2020-05-30 DIAGNOSIS — M25662 Stiffness of left knee, not elsewhere classified: Secondary | ICD-10-CM

## 2020-05-30 DIAGNOSIS — R262 Difficulty in walking, not elsewhere classified: Secondary | ICD-10-CM

## 2020-05-30 NOTE — Therapy (Signed)
[facility] [address] [place], [place], [address] Phone: [phone]   Fax:  [phone]  Physical Therapy Treatment  Patient Details  Name: Monique Wagner MRN: [id number] Date of Birth: Feb 22, 1941 Referring Provider (PT): Samson Frederic   Encounter Date: 05/30/2020   PT End of Session - 05/30/20 1222    Visit Number 3    Number of Visits 18    Date for PT Re-Evaluation 07/06/20    Authorization Type BCBS  No auth, no VL, CP $25    Progress Note Due on Visit 10    PT Start Time 1134    PT Stop Time 1218    PT Time Calculation (min) 44 min    Activity Tolerance Patient tolerated treatment well    Behavior During Therapy [facility] for tasks assessed/performed           Past Medical History:  Diagnosis Date  . Arthritis   . Diverticulitis   . Dyspnea   . HTN (hypertension)   . Hyperlipidemia   . Stroke (HCC)    slight stroke 50 years ago - no deficits     Past Surgical History:  Procedure Laterality Date  . ABDOMINAL HYSTERECTOMY    . BREAST SURGERY    . 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   Subjective Assessment - 05/30/20 1227    Subjective pt reports no pain just general stiffness at times.    Currently in Pain? No/denies                             [facility] Adult PT Treatment/Exercise - 05/30/20 0001      Ambulation/Gait   Ambulation/Gait Yes    Ambulation/Gait Assistance 6: Modified independent (Device/Increase time)    Ambulation Distance (Feet) 220 Feet    Assistive device Rolling walker    Gait Pattern Decreased hip/knee flexion - left;Decreased dorsiflexion - left      Knee/Hip Exercises: Stretches   Knee: Self-Stretch to increase Flexion 10 seconds    Knee: Self-Stretch Limitations 10 reps on 12" step       Knee/Hip Exercises: Standing   Heel Raises 15 reps    Knee Flexion Left;10 reps      Knee/Hip Exercises: Seated   Long Arc Quad Left;10 reps    Heel Slides Left;10 reps    Heel Slides Limitations RLE providing overpressure, 5 sec hold    Sit to Sand 10 reps;without UE support   cues for eccentric control     Knee/Hip Exercises: Supine   Quad Sets Left;15 reps    Heel Slides Left;10 reps    Heel Slides Limitations 5 sec hold, therapist holding at end range    Straight Leg Raises Left;15 reps    Knee Extension Left;AROM    Knee Extension Limitations 4    Knee Flexion Left;AROM    Knee Flexion Limitations 84      Manual Therapy   Manual Therapy Edema management    Manual therapy comments Manual complete separate rest of tx    Edema Management to Lt knee                     PT Short Term Goals - 05/27/20 5397  PT SHORT TERM GOAL #1   Title Patient will be independent in self management strategies to improve quality of life and functional outcomes.    Time 3    Period Weeks    Status On-going    Target Date 06/15/20      PT SHORT TERM GOAL #2   Title Patient will be able to ambulate 226 feet in 2 minutes without AD to demonstrate improved walking mechanics    Time 3    Period Weeks    Status On-going    Target Date 06/15/20      PT SHORT TERM GOAL #3   Title Patient wil be able to transition from sit <-> stand without UE assist    Time 3    Period Weeks    Status On-going    Target Date 06/15/20             PT Long Term Goals - 05/27/20 0903      PT LONG TERM GOAL #1   Title Patient will be able to ascend and descend steps with use of railing as needed with reciprocal gait pattern to improve transitional mobility.    Time 6    Period Weeks    Status On-going      PT LONG TERM GOAL #2   Title Patient will improve on FOTO score to meet predicted outcomes to demonstrate improved functional mobility.    Time 6    Period Weeks    Status  On-going      PT LONG TERM GOAL #3   Title Patient will report at least 50% improvement in overall symptoms and/or function to demonstrate improved functional mobility    Time 6    Period Weeks    Status On-going      PT LONG TERM GOAL #4   Title Patient will demonstate 0-110 degress of left knee ROM    Time 6    Period Weeks    Status On-going                 Plan - 05/30/20 1222    Clinical Impression Statement Treatment began by other therapist [facility]) and worked on gait with ability to complete 1 HHA  for short distance.  Discussed trying QC vs SPC next session with patient.  Did all standing stretches and strengthening this session with good form and no increased pain.  Pt with improved ROM today at 4-84 degrees.  Began myofascial techniques/soft tissue to knee to help reduce adhesions and edema.  Pt reported knee feeling looser at end of sessio today.    Personal Factors and Comorbidities Comorbidity 1    Comorbidities bilateral kne epain    Examination-Activity Limitations Bed Mobility;Bathing;Sit;Transfers;Locomotion Level;Stand;Stairs;Squat;Carry;Bend    Examination-Participation Restrictions Meal Prep;Driving;Shop;Community Activity;Cleaning    Stability/Clinical Decision Making Stable/Uncomplicated    Rehab Potential Good    PT Frequency 3x / week    PT Duration 6 weeks    PT Treatment/Interventions ADLs/Self Care Home Management;Aquatic Therapy;Cryotherapy;Electrical Stimulation;Iontophoresis 4mg /ml Dexamethasone;Moist Heat;Traction;Balance training;Therapeutic exercise;Therapeutic activities;Functional mobility training;Stair training;Gait training;DME Instruction;Neuromuscular re-education;Patient/family education;Manual techniques;Dry needling;Passive range of motion;Scar mobilization    PT Next Visit Plan Continue knee ROM and strengthening exercises. Edema management to reduce swelling and improve knee flexion.  Begin gait with QC or SPC next session.    PT Home  Exercise Plan heel slides, elevation    Consulted and Agree with Plan of Care Patient           Patient will  benefit from skilled therapeutic intervention in order to improve the following deficits and impairments:  Abnormal gait, Decreased endurance, Decreased skin integrity, Pain, Decreased strength, Decreased activity tolerance, Difficulty walking, Decreased mobility, Decreased balance, Decreased range of motion, Decreased safety awareness, Decreased knowledge of use of DME, Increased edema  Visit Diagnosis: Chronic pain of left knee  Decreased range of motion (ROM) of left knee  Difficulty in walking, not elsewhere classified     Problem List Patient Active Problem List   Diagnosis Date Noted  . Osteoarthritis of left knee 05/18/2020  . Anemia   . Lower abdominal pain   . Diverticulitis 08/28/2017  . Pain in the chest   . Chest pain 09/03/2014  . HTN (hypertension) 09/03/2014  . Hyperlipidemia 09/03/2014  . Pain in joint, shoulder region 07/10/2012  . 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  . Osteoarthrosis, unspecified whether generalized or localized, lower leg 04/21/2008  . KNEE PAIN 04/21/2008  . HIGH BLOOD PRESSURE 04/20/2008   Lurena Nida, PTA/CLT [phone]  Lurena Nida 05/30/2020, 12:28 PM  [facility] [address] [place], [place], [address] Phone: [phone]   Fax:  [phone]  Name: Monique Wagner MRN: [id number] Date of Birth: 1941/06/20

## 2020-06-01 ENCOUNTER — Other Ambulatory Visit: Payer: Self-pay

## 2020-06-01 ENCOUNTER — Ambulatory Visit (HOSPITAL_COMMUNITY): Payer: Medicare Other | Attending: Orthopedic Surgery | Admitting: Physical Therapy

## 2020-06-01 DIAGNOSIS — G8929 Other chronic pain: Secondary | ICD-10-CM | POA: Insufficient documentation

## 2020-06-01 DIAGNOSIS — M25662 Stiffness of left knee, not elsewhere classified: Secondary | ICD-10-CM | POA: Diagnosis present

## 2020-06-01 DIAGNOSIS — R262 Difficulty in walking, not elsewhere classified: Secondary | ICD-10-CM | POA: Insufficient documentation

## 2020-06-01 DIAGNOSIS — M25562 Pain in left knee: Secondary | ICD-10-CM | POA: Diagnosis present

## 2020-06-01 NOTE — Therapy (Signed)
[facility] [address] [place], [place], [address] Phone: [phone]   Fax:  [phone]  Physical Therapy Treatment  Patient Details  Name: Monique Wagner MRN: [id number] Date of Birth: 1941/05/06 Referring Provider (PT): Samson Frederic   Encounter Date: 06/01/2020   PT End of Session - 06/01/20 1217    Visit Number 4    Number of Visits 18    Date for PT Re-Evaluation 07/06/20    Authorization Type BCBS  No auth, no VL, CP $25    Progress Note Due on Visit 10    PT Start Time 1135    PT Stop Time 1213    PT Time Calculation (min) 38 min    Activity Tolerance Patient tolerated treatment well    Behavior During Therapy [facility] for tasks assessed/performed           Past Medical History:  Diagnosis Date  . Arthritis   . Diverticulitis   . Dyspnea   . HTN (hypertension)   . Hyperlipidemia   . Stroke (HCC)    slight stroke 50 years ago - no deficits     Past Surgical History:  Procedure Laterality Date  . ABDOMINAL HYSTERECTOMY    . BREAST SURGERY    . 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   Subjective Assessment - 06/01/20 1140    Subjective PT states MD was pleased with progress and removed her TED hose and bandage.  STates she will get her Rt knee replaced when allowed to schedule elective surgeries, possibly next year.   Currently without pain or issues in her Lt knee.                             [facility] Adult PT Treatment/Exercise - 06/01/20 0001      Knee/Hip Exercises: Stretches   Knee: Self-Stretch to increase Flexion 10 seconds    Knee: Self-Stretch Limitations 10 reps on 12" step      Knee/Hip Exercises: Standing   Heel Raises 15 reps    Heel Raises Limitations toeraises 10 reps    Knee Flexion Left;10  reps    Gait Training with SPC 225 feet SBA      Knee/Hip Exercises: Seated   Sit to Sand without UE support;2 sets;5 reps      Knee/Hip Exercises: Supine   Knee Extension Left;AROM    Knee Extension Limitations 2    Knee Flexion Left;AROM    Knee Flexion Limitations 86 before manual, 90 after                  PT Education - 06/01/20 1141    Education Details Answered pt's questions regarding icing and sleeping with her knee bent.  Instructed with gait training using cane    Person(s) Educated Patient    Methods Explanation    Comprehension Verbalized understanding            PT Short Term Goals - 05/27/20 0902      PT SHORT TERM GOAL #1   Title Patient will be independent in self management strategies to improve quality of life and functional outcomes.    Time 3    Period Weeks    Status On-going    Target Date  06/15/20      PT SHORT TERM GOAL #2   Title Patient will be able to ambulate 226 feet in 2 minutes without AD to demonstrate improved walking mechanics    Time 3    Period Weeks    Status On-going    Target Date 06/15/20      PT SHORT TERM GOAL #3   Title Patient wil be able to transition from sit <-> stand without UE assist    Time 3    Period Weeks    Status On-going    Target Date 06/15/20             PT Long Term Goals - 05/27/20 0903      PT LONG TERM GOAL #1   Title Patient will be able to ascend and descend steps with use of railing as needed with reciprocal gait pattern to improve transitional mobility.    Time 6    Period Weeks    Status On-going      PT LONG TERM GOAL #2   Title Patient will improve on FOTO score to meet predicted outcomes to demonstrate improved functional mobility.    Time 6    Period Weeks    Status On-going      PT LONG TERM GOAL #3   Title Patient will report at least 50% improvement in overall symptoms and/or function to demonstrate improved functional mobility    Time 6    Period Weeks    Status  On-going      PT LONG TERM GOAL #4   Title Patient will demonstate 0-110 degress of left knee ROM    Time 6    Period Weeks    Status On-going                 Plan - 06/01/20 1218    Clinical Impression Statement Pt returns today with no pain and reports MD is pleased with progress.  Continued with primary focus on knee flexion.  Improved ROM 2-86 prior to manual and 90 degrees flexion following manual.  Overall less tightness or adhesions perimeter of knee and along scar line as compared to last session.  Gait trained with SPC with good stability, sequencing and comfort using AD.  Verbalized to her and CG she could begin using cane instead of walker at this point.    Personal Factors and Comorbidities Comorbidity 1    Comorbidities bilateral kne epain    Examination-Activity Limitations Bed Mobility;Bathing;Sit;Transfers;Locomotion Level;Stand;Stairs;Squat;Carry;Bend    Examination-Participation Restrictions Meal Prep;Driving;Shop;Community Activity;Cleaning    Stability/Clinical Decision Making Stable/Uncomplicated    Rehab Potential Good    PT Frequency 3x / week    PT Duration 6 weeks    PT Treatment/Interventions ADLs/Self Care Home Management;Aquatic Therapy;Cryotherapy;Electrical Stimulation;Iontophoresis 4mg /ml Dexamethasone;Moist Heat;Traction;Balance training;Therapeutic exercise;Therapeutic activities;Functional mobility training;Stair training;Gait training;DME Instruction;Neuromuscular re-education;Patient/family education;Manual techniques;Dry needling;Passive range of motion;Scar mobilization    PT Next Visit Plan Continue knee ROM and strengthening exercises. Edema management to reduce swelling and improve knee flexion.    PT Home Exercise Plan heel slides, elevation    Consulted and Agree with Plan of Care Patient           Patient will benefit from skilled therapeutic intervention in order to improve the following deficits and impairments:  Abnormal gait,  Decreased endurance, Decreased skin integrity, Pain, Decreased strength, Decreased activity tolerance, Difficulty walking, Decreased mobility, Decreased balance, Decreased range of motion, Decreased safety awareness, Decreased knowledge of use of DME, Increased edema  Visit Diagnosis: Chronic pain of left knee  Decreased range of motion (ROM) of left knee  Difficulty in walking, not elsewhere classified     Problem List Patient Active Problem List   Diagnosis Date Noted  . Osteoarthritis of left knee 05/18/2020  . Anemia   . Lower abdominal pain   . Diverticulitis 08/28/2017  . Pain in the chest   . Chest pain 09/03/2014  . HTN (hypertension) 09/03/2014  . Hyperlipidemia 09/03/2014  . Pain in joint, shoulder region 07/10/2012  . 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  . Osteoarthrosis, unspecified whether generalized or localized, lower leg 04/21/2008  . KNEE PAIN 04/21/2008  . HIGH BLOOD PRESSURE 04/20/2008   Lurena Nida, PTA/CLT [phone]  Lurena Nida 06/01/2020, 12:27 PM  [facility] [address] [place], [place], [address] Phone: [phone]   Fax:  [phone]  Name: Monique Wagner MRN: [id number] Date of Birth: May 10, 1941

## 2020-06-03 ENCOUNTER — Encounter (HOSPITAL_COMMUNITY): Payer: Self-pay | Admitting: Physical Therapy

## 2020-06-03 ENCOUNTER — Ambulatory Visit (HOSPITAL_COMMUNITY): Payer: Medicare Other | Admitting: Physical Therapy

## 2020-06-03 ENCOUNTER — Other Ambulatory Visit: Payer: Self-pay

## 2020-06-03 DIAGNOSIS — M25562 Pain in left knee: Secondary | ICD-10-CM

## 2020-06-03 DIAGNOSIS — R262 Difficulty in walking, not elsewhere classified: Secondary | ICD-10-CM

## 2020-06-03 DIAGNOSIS — G8929 Other chronic pain: Secondary | ICD-10-CM

## 2020-06-03 DIAGNOSIS — M25662 Stiffness of left knee, not elsewhere classified: Secondary | ICD-10-CM

## 2020-06-03 NOTE — Therapy (Addendum)
[facility] [address] [place], [place], [address] Phone: [phone]   Fax:  [phone]  Physical Therapy Treatment  Patient Details  Name: Monique Wagner MRN: [id number] Date of Birth: 06-13-1941 Referring Provider (PT): Samson Frederic   Encounter Date: 06/03/2020   PT End of Session - 06/03/20 1047    Visit Number 5    Number of Visits 18    Date for PT Re-Evaluation 07/06/20    Authorization Type BCBS  No auth, no VL, CP $25    Progress Note Due on Visit 10    PT Start Time 1047    PT Stop Time 1127    PT Time Calculation (min) 40 min    Activity Tolerance Patient tolerated treatment well    Behavior During Therapy [facility] for tasks assessed/performed           Past Medical History:  Diagnosis Date  . Arthritis   . Diverticulitis   . Dyspnea   . HTN (hypertension)   . Hyperlipidemia   . Stroke (HCC)    slight stroke 50 years ago - no deficits     Past Surgical History:  Procedure Laterality Date  . ABDOMINAL HYSTERECTOMY    . BREAST SURGERY    . 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   Subjective Assessment - 06/03/20 1055    Subjective Reports no pain. States she has been doing well. Her right knee bothers her more than her left knee.    Currently in Pain? No/denies              [facility] PT Assessment - 06/03/20 0001      Assessment   Medical Diagnosis L TKA    Referring Provider (PT) Arlys John Swinteck    Onset Date/Surgical Date 05/18/20                         [facility] Adult PT Treatment/Exercise - 06/03/20 0001      Knee/Hip Exercises: Stretches   Quad Stretch Left;5 reps;20 seconds   thomas stretch - bed lowered      Knee/Hip Exercises: Aerobic   Stationary Bike 5 minutes rocking - right knee more  limited than left - PT assist with motion       Knee/Hip Exercises: Seated   Long Arc Quad Left;10 reps      Knee/Hip Exercises: Supine   Heel Slides Left;10 reps;2 sets    Heel Slides Limitations 5 sec hold, therapist holding at end range    Bridges AROM;Strengthening;3 sets;5 reps   3" holds    Knee Extension Left;AROM    Knee Extension Limitations 2   lacking   Knee Flexion Left;AROM    Knee Flexion Limitations 94    Other Supine Knee/Hip Exercises hip at 90 degrees flexion and PT left knee ROM       Manual Therapy   Manual Therapy Edema management    Manual therapy comments Manual complete separate rest of tx    Edema Management to Lt knee                   PT Education - 06/03/20 1057    Education Details educated patient on continued elevation for swelling. Benefits of ice secondary to patient inquires.  Person(s) Educated Patient    Methods Explanation    Comprehension Verbalized understanding            PT Short Term Goals - 05/27/20 0902      PT SHORT TERM GOAL #1   Title Patient will be independent in self management strategies to improve quality of life and functional outcomes.    Time 3    Period Weeks    Status On-going    Target Date 06/15/20      PT SHORT TERM GOAL #2   Title Patient will be able to ambulate 226 feet in 2 minutes without AD to demonstrate improved walking mechanics    Time 3    Period Weeks    Status On-going    Target Date 06/15/20      PT SHORT TERM GOAL #3   Title Patient wil be able to transition from sit <-> stand without UE assist    Time 3    Period Weeks    Status On-going    Target Date 06/15/20             PT Long Term Goals - 05/27/20 0903      PT LONG TERM GOAL #1   Title Patient will be able to ascend and descend steps with use of railing as needed with reciprocal gait pattern to improve transitional mobility.    Time 6    Period Weeks    Status On-going      PT LONG TERM GOAL #2   Title  Patient will improve on FOTO score to meet predicted outcomes to demonstrate improved functional mobility.    Time 6    Period Weeks    Status On-going      PT LONG TERM GOAL #3   Title Patient will report at least 50% improvement in overall symptoms and/or function to demonstrate improved functional mobility    Time 6    Period Weeks    Status On-going      PT LONG TERM GOAL #4   Title Patient will demonstate 0-110 degress of left knee ROM    Time 6    Period Weeks    Status On-going              06/07/20 1513  Plan  Clinical Impression Statement Continued to educate patient on benefits of elevation secondary to swelling. Improved knee flexion noted with stretching and manual work especially with hip at 90 degrees of flexion. Continues to improve in overall ROM. Will continue to benefit from skilled physical therapy focusing on ROM and strength as tolerated.  Personal Factors and Comorbidities Comorbidity 1  Comorbidities bilateral kne epain  Examination-Activity Limitations Bed Mobility;Bathing;Sit;Transfers;Locomotion Level;Stand;Stairs;Squat;Carry;Bend  Examination-Participation Restrictions Meal Prep;Driving;Shop;Community Activity;Cleaning  Pt will benefit from skilled therapeutic intervention in order to improve on the following deficits Abnormal gait;Decreased endurance;Decreased skin integrity;Pain;Decreased strength;Decreased activity tolerance;Difficulty walking;Decreased mobility;Decreased balance;Decreased range of motion;Decreased safety awareness;Decreased knowledge of use of DME;Increased edema  Stability/Clinical Decision Making Stable/Uncomplicated  Rehab Potential Good  PT Frequency 3x / week  PT Duration 6 weeks  PT Treatment/Interventions ADLs/Self Care Home Management;Aquatic Therapy;Cryotherapy;Electrical Stimulation;Iontophoresis 4mg /ml Dexamethasone;Moist Heat;Traction;Balance training;Therapeutic exercise;Therapeutic activities;Functional mobility  training;Stair training;Gait training;DME Instruction;Neuromuscular re-education;Patient/family education;Manual techniques;Dry needling;Passive range of motion;Scar mobilization  PT Next Visit Plan Continue knee ROM and strengthening exercises. Edema management to reduce swelling and improve knee flexion.  PT Home Exercise Plan heel slides, elevation  Consulted and Agree with Plan of Care Patient  Patient will benefit from skilled therapeutic intervention in order to improve the following deficits and impairments:     Visit Diagnosis: Chronic pain of left knee  Decreased range of motion (ROM) of left knee  Difficulty in walking, not elsewhere classified     Problem List Patient Active Problem List   Diagnosis Date Noted  . Osteoarthritis of left knee 05/18/2020  . Anemia   . Lower abdominal pain   . Diverticulitis 08/28/2017  . Pain in the chest   . Chest pain 09/03/2014  . HTN (hypertension) 09/03/2014  . Hyperlipidemia 09/03/2014  . Pain in joint, shoulder region 07/10/2012  . 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  . Osteoarthrosis, unspecified whether generalized or localized, lower leg 04/21/2008  . KNEE PAIN 04/21/2008  . HIGH BLOOD PRESSURE 04/20/2008   11:32 AM, 06/03/20 Tereasa Coop, DPT Physical Therapy with [facility]  [phone] office  [facility] [address] [place], [place], [address] Phone: [phone]   Fax:  [phone]  Name: Monique Wagner MRN: [id number] Date of Birth: 12/30/1940

## 2020-06-07 ENCOUNTER — Ambulatory Visit (HOSPITAL_COMMUNITY): Payer: Medicare Other | Admitting: Physical Therapy

## 2020-06-07 ENCOUNTER — Other Ambulatory Visit: Payer: Self-pay

## 2020-06-07 DIAGNOSIS — R262 Difficulty in walking, not elsewhere classified: Secondary | ICD-10-CM

## 2020-06-07 DIAGNOSIS — G8929 Other chronic pain: Secondary | ICD-10-CM

## 2020-06-07 DIAGNOSIS — M25562 Pain in left knee: Secondary | ICD-10-CM | POA: Diagnosis not present

## 2020-06-07 DIAGNOSIS — M25662 Stiffness of left knee, not elsewhere classified: Secondary | ICD-10-CM

## 2020-06-07 NOTE — Therapy (Signed)
[facility] [address] [place], [place], [address] Phone: [phone]   Fax:  [phone]  Physical Therapy Treatment  Patient Details  Name: Monique Wagner MRN: [id number] Date of Birth: Sep 13, 1941 Referring Provider (PT): Samson Frederic   Encounter Date: 06/07/2020   PT End of Session - 06/07/20 1503    Visit Number 6    Number of Visits 18    Date for PT Re-Evaluation 07/06/20    Authorization Type BCBS  No auth, no VL, CP $25    Progress Note Due on Visit 10    PT Start Time 1404    PT Stop Time 1447    PT Time Calculation (min) 43 min    Activity Tolerance Patient tolerated treatment well    Behavior During Therapy [facility] for tasks assessed/performed           Past Medical History:  Diagnosis Date  . Arthritis   . Diverticulitis   . Dyspnea   . HTN (hypertension)   . Hyperlipidemia   . Stroke (HCC)    slight stroke 50 years ago - no deficits     Past Surgical History:  Procedure Laterality Date  . ABDOMINAL HYSTERECTOMY    . BREAST SURGERY    . 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   Subjective Assessment - 06/07/20 1502    Subjective pt states she is doing well today.  continues to use her cane, however does use her walker when walking in her driveway for exercise.    Currently in Pain? No/denies                             [facility] Adult PT Treatment/Exercise - 06/07/20 0001      Knee/Hip Exercises: Stretches   Active Hamstring Stretch Left;3 reps;30 seconds    Active Hamstring Stretch Limitations 12" step    Knee: Self-Stretch to increase Flexion 10 seconds    Knee: Self-Stretch Limitations 10 reps on 12" step      Knee/Hip Exercises: Standing   Heel Raises 15 reps    Heel Raises Limitations toeraises  10 reps    Knee Flexion Left;10 reps      Knee/Hip Exercises: Seated   Long Arc Quad Left;10 reps    Sit to [employer] without UE support;2 sets;5 reps      Knee/Hip Exercises: Supine   Short Arc Quad Sets Left;10 reps    Heel Slides Left;10 reps;2 sets    Heel Slides Limitations 5 sec hold, therapist holding at end range    Straight Leg Raises Left;10 reps    Knee Extension Left;AROM    Knee Extension Limitations 2    Knee Flexion Left;AROM    Knee Flexion Limitations 98      Manual Therapy   Manual Therapy Soft tissue mobilization    Manual therapy comments Manual complete separate rest of tx    Edema Management --    Soft tissue mobilization to reduce tightness in scar line and permeiter of knee                    PT Short Term Goals - 05/27/20 0902      PT SHORT TERM GOAL #1   Title Patient will  be independent in self management strategies to improve quality of life and functional outcomes.    Time 3    Period Weeks    Status On-going    Target Date 06/15/20      PT SHORT TERM GOAL #2   Title Patient will be able to ambulate 226 feet in 2 minutes without AD to demonstrate improved walking mechanics    Time 3    Period Weeks    Status On-going    Target Date 06/15/20      PT SHORT TERM GOAL #3   Title Patient wil be able to transition from sit <-> stand without UE assist    Time 3    Period Weeks    Status On-going    Target Date 06/15/20             PT Long Term Goals - 05/27/20 0903      PT LONG TERM GOAL #1   Title Patient will be able to ascend and descend steps with use of railing as needed with reciprocal gait pattern to improve transitional mobility.    Time 6    Period Weeks    Status On-going      PT LONG TERM GOAL #2   Title Patient will improve on FOTO score to meet predicted outcomes to demonstrate improved functional mobility.    Time 6    Period Weeks    Status On-going      PT LONG TERM GOAL #3   Title Patient will report at  least 50% improvement in overall symptoms and/or function to demonstrate improved functional mobility    Time 6    Period Weeks    Status On-going      PT LONG TERM GOAL #4   Title Patient will demonstate 0-110 degress of left knee ROM    Time 6    Period Weeks    Status On-going                 Plan - 06/07/20 1636    Clinical Impression Statement continued with established POC. Knee flexion continues to improve at each session with achievement of 98 degrees tdoay. Scar massage completed with tightnss in all directions, however good patellar movement.  Encouraged to complete self manual to scar and tight tissue perimeter of incision.    Personal Factors and Comorbidities Comorbidity 1    Comorbidities bilateral kne epain    Examination-Activity Limitations Bed Mobility;Bathing;Sit;Transfers;Locomotion Level;Stand;Stairs;Squat;Carry;Bend    Examination-Participation Restrictions Meal Prep;Driving;Shop;Community Activity;Cleaning    Stability/Clinical Decision Making Stable/Uncomplicated    Rehab Potential Good    PT Frequency 3x / week    PT Duration 6 weeks    PT Treatment/Interventions ADLs/Self Care Home Management;Aquatic Therapy;Cryotherapy;Electrical Stimulation;Iontophoresis 4mg /ml Dexamethasone;Moist Heat;Traction;Balance training;Therapeutic exercise;Therapeutic activities;Functional mobility training;Stair training;Gait training;DME Instruction;Neuromuscular re-education;Patient/family education;Manual techniques;Dry needling;Passive range of motion;Scar mobilization    PT Next Visit Plan Continue knee ROM and strengthening exercises. Edema management to reduce swelling and improve knee flexion.    PT Home Exercise Plan heel slides, elevation    Consulted and Agree with Plan of Care Patient           Patient will benefit from skilled therapeutic intervention in order to improve the following deficits and impairments:  Abnormal gait, Decreased endurance, Decreased skin  integrity, Pain, Decreased strength, Decreased activity tolerance, Difficulty walking, Decreased mobility, Decreased balance, Decreased range of motion, Decreased safety awareness, Decreased knowledge of use of DME, Increased edema  Visit Diagnosis: Decreased range  of motion (ROM) of left knee  Difficulty in walking, not elsewhere classified  Chronic pain of left knee     Problem List Patient Active Problem List   Diagnosis Date Noted  . Osteoarthritis of left knee 05/18/2020  . Anemia   . Lower abdominal pain   . Diverticulitis 08/28/2017  . Pain in the chest   . Chest pain 09/03/2014  . HTN (hypertension) 09/03/2014  . Hyperlipidemia 09/03/2014  . Pain in joint, shoulder region 07/10/2012  . 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  . Osteoarthrosis, unspecified whether generalized or localized, lower leg 04/21/2008  . KNEE PAIN 04/21/2008  . HIGH BLOOD PRESSURE 04/20/2008   Lurena Nida, PTA/CLT [phone]  Lurena Nida 06/07/2020, 4:38 PM  [facility] [address] [place], [place], [address] Phone: [phone]   Fax:  [phone]  Name: HADJA HARRAL MRN: [id number] Date of Birth: 01/26/1941

## 2020-06-08 ENCOUNTER — Ambulatory Visit (HOSPITAL_COMMUNITY): Payer: Medicare Other

## 2020-06-09 ENCOUNTER — Encounter (HOSPITAL_COMMUNITY): Payer: Self-pay | Admitting: Physical Therapy

## 2020-06-09 ENCOUNTER — Other Ambulatory Visit: Payer: Self-pay

## 2020-06-09 ENCOUNTER — Ambulatory Visit (HOSPITAL_COMMUNITY): Payer: Medicare Other | Admitting: Physical Therapy

## 2020-06-09 DIAGNOSIS — M25562 Pain in left knee: Secondary | ICD-10-CM | POA: Diagnosis not present

## 2020-06-09 DIAGNOSIS — G8929 Other chronic pain: Secondary | ICD-10-CM

## 2020-06-09 DIAGNOSIS — M25662 Stiffness of left knee, not elsewhere classified: Secondary | ICD-10-CM

## 2020-06-09 DIAGNOSIS — R262 Difficulty in walking, not elsewhere classified: Secondary | ICD-10-CM

## 2020-06-09 NOTE — Therapy (Signed)
[place] [facility] [address] [place], [place], [address] Phone: [phone]   Fax:  [phone]  Physical Therapy Treatment  Patient Details  Name: Monique Wagner MRN: [id number] Date of Birth: 1941-05-14 Referring Provider (PT): Samson Frederic   Encounter Date: 06/09/2020   PT End of Session - 06/09/20 1026    Visit Number 7    Number of Visits 18    Date for PT Re-Evaluation 07/06/20    Authorization Type BCBS  No auth, no VL, CP $25    Progress Note Due on Visit 10    PT Start Time 1030    PT Stop Time 1110    PT Time Calculation (min) 40 min    Activity Tolerance Patient tolerated treatment well    Behavior During Therapy [facility] for tasks assessed/performed           Past Medical History:  Diagnosis Date  . Arthritis   . Diverticulitis   . Dyspnea   . HTN (hypertension)   . Hyperlipidemia   . Stroke (HCC)    slight stroke 50 years ago - no deficits     Past Surgical History:  Procedure Laterality Date  . ABDOMINAL HYSTERECTOMY    . BREAST SURGERY    . 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   Subjective Assessment - 06/09/20 1028    Subjective Patient states her exercises are going well. Doing things around the house is going alright.    Currently in Pain? No/denies                             [facility] Adult PT Treatment/Exercise - 06/09/20 0001      Knee/Hip Exercises: Stretches   Knee: Self-Stretch to increase Flexion 10 seconds    Knee: Self-Stretch Limitations 10 reps on 12" step      Knee/Hip Exercises: Standing   Heel Raises 15 reps    Knee Flexion Left;20 reps    Lateral Step Up Left;2 sets;10 reps;Hand Hold: 1;Step Height: 4"    Forward Step Up 2 sets;10 reps;Hand Hold: 2;Step Height: 4";Left       Knee/Hip Exercises: Seated   Hamstring Curl --    Sit to Sand 3 sets;5 reps;without UE support      Knee/Hip Exercises: Supine   Knee Extension Left;AROM    Knee Extension Limitations 2    Knee Flexion Left;AROM    Knee Flexion Limitations 101      Manual Therapy   Manual Therapy Muscle Energy Technique    Manual therapy comments Manual complete separate rest of tx    Muscle Energy Technique contract relax in seated for flexion                  PT Education - 06/09/20 1025    Education Details Patient educated on HEP, mechanics of exercise    Person(s) Educated Patient    Methods Explanation;Demonstration    Comprehension Returned demonstration;Verbalized understanding            PT Short Term Goals - 05/27/20 0902      PT SHORT TERM GOAL #1   Title Patient will be independent in self management strategies to improve quality of life and functional outcomes.    Time  3    Period Weeks    Status On-going    Target Date 06/15/20      PT SHORT TERM GOAL #2   Title Patient will be able to ambulate 226 feet in 2 minutes without AD to demonstrate improved walking mechanics    Time 3    Period Weeks    Status On-going    Target Date 06/15/20      PT SHORT TERM GOAL #3   Title Patient wil be able to transition from sit <-> stand without UE assist    Time 3    Period Weeks    Status On-going    Target Date 06/15/20             PT Long Term Goals - 05/27/20 0903      PT LONG TERM GOAL #1   Title Patient will be able to ascend and descend steps with use of railing as needed with reciprocal gait pattern to improve transitional mobility.    Time 6    Period Weeks    Status On-going      PT LONG TERM GOAL #2   Title Patient will improve on FOTO score to meet predicted outcomes to demonstrate improved functional mobility.    Time 6    Period Weeks    Status On-going      PT LONG TERM GOAL #3   Title Patient will report at least 50% improvement in overall  symptoms and/or function to demonstrate improved functional mobility    Time 6    Period Weeks    Status On-going      PT LONG TERM GOAL #4   Title Patient will demonstate 0-110 degress of left knee ROM    Time 6    Period Weeks    Status On-going                 Plan - 06/09/20 1026    Clinical Impression Statement Attempted bike for ROM but patient unable to flex right knee enough to get feet on peddles. Patient requires bilateral UE support initially with step up exercise but is able to progress to unilateral UE support. Patient requires several rest breaks due to fatigue today. Patient tends to rely on momentum with STS exercise secondary to impaired LE strength. Patient limited in L knee flexion with hamstring curls but notes fatigue in hamstrings. Patient demonstrates improving knee flexion ROM to 101 degrees today. She fatigues quickly with contract relax.  Patient will continue to benefit from skilled physical therapy in order to reduce impairment and improve function.    Personal Factors and Comorbidities Comorbidity 1    Comorbidities bilateral kne epain    Examination-Activity Limitations Bed Mobility;Bathing;Sit;Transfers;Locomotion Level;Stand;Stairs;Squat;Carry;Bend    Examination-Participation Restrictions Meal Prep;Driving;Shop;Community Activity;Cleaning    Stability/Clinical Decision Making Stable/Uncomplicated    Rehab Potential Good    PT Frequency 3x / week    PT Duration 6 weeks    PT Treatment/Interventions ADLs/Self Care Home Management;Aquatic Therapy;Cryotherapy;Electrical Stimulation;Iontophoresis 4mg /ml Dexamethasone;Moist Heat;Traction;Balance training;Therapeutic exercise;Therapeutic activities;Functional mobility training;Stair training;Gait training;DME Instruction;Neuromuscular re-education;Patient/family education;Manual techniques;Dry needling;Passive range of motion;Scar mobilization    PT Next Visit Plan Continue knee ROM and strengthening  exercises. Edema management to reduce swelling and improve knee flexion.    PT Home Exercise Plan heel slides, elevation    Consulted and Agree with Plan of Care Patient           Patient will benefit from skilled therapeutic intervention in order to improve  the following deficits and impairments:  Abnormal gait, Decreased endurance, Decreased skin integrity, Pain, Decreased strength, Decreased activity tolerance, Difficulty walking, Decreased mobility, Decreased balance, Decreased range of motion, Decreased safety awareness, Decreased knowledge of use of DME, Increased edema  Visit Diagnosis: Decreased range of motion (ROM) of left knee  Difficulty in walking, not elsewhere classified  Chronic pain of left knee     Problem List Patient Active Problem List   Diagnosis Date Noted  . Osteoarthritis of left knee 05/18/2020  . Anemia   . Lower abdominal pain   . Diverticulitis 08/28/2017  . Pain in the chest   . Chest pain 09/03/2014  . HTN (hypertension) 09/03/2014  . Hyperlipidemia 09/03/2014  . Pain in joint, shoulder region 07/10/2012  . 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  . Osteoarthrosis, unspecified whether generalized or localized, lower leg 04/21/2008  . KNEE PAIN 04/21/2008  . HIGH BLOOD PRESSURE 04/20/2008    11:16 AM, 06/09/20 Wyman Songster PT, DPT Physical Therapist at [facility]   [facility] [address] [place], [place], [address] Phone: [phone]   Fax:  [phone]  Name: Monique Wagner MRN: [id number] Date of Birth: [birth year]

## 2020-06-13 ENCOUNTER — Ambulatory Visit (HOSPITAL_COMMUNITY): Payer: Medicare Other | Admitting: Physical Therapy

## 2020-06-13 ENCOUNTER — Other Ambulatory Visit: Payer: Self-pay

## 2020-06-13 DIAGNOSIS — R262 Difficulty in walking, not elsewhere classified: Secondary | ICD-10-CM

## 2020-06-13 DIAGNOSIS — M25562 Pain in left knee: Secondary | ICD-10-CM | POA: Diagnosis not present

## 2020-06-13 DIAGNOSIS — M25662 Stiffness of left knee, not elsewhere classified: Secondary | ICD-10-CM

## 2020-06-13 NOTE — Therapy (Signed)
[facility] [address] [place], [place], [address] Phone: [phone]   Fax:  [phone]  Physical Therapy Treatment  Patient Details  Name: Monique Wagner MRN: [id number] Date of Birth: [birth year] Referring Provider (PT): Samson Frederic   Encounter Date: 06/13/2020   PT End of Session - 06/13/20 1310    Visit Number 8    Number of Visits 18    Date for PT Re-Evaluation 07/06/20    Authorization Type BCBS  No auth, no VL, CP $25    Progress Note Due on Visit 10    PT Start Time 1132    PT Stop Time 1214    PT Time Calculation (min) 42 min    Activity Tolerance Patient tolerated treatment well    Behavior During Therapy [facility] for tasks assessed/performed           Past Medical History:  Diagnosis Date  . Arthritis   . Diverticulitis   . Dyspnea   . HTN (hypertension)   . Hyperlipidemia   . Stroke (HCC)    slight stroke 50 years ago - no deficits     Past Surgical History:  Procedure Laterality Date  . ABDOMINAL HYSTERECTOMY    . BREAST SURGERY    . 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   Subjective Assessment - 06/13/20 1135    Subjective pt states she is doing well overall.  No pain currently.  STates she is walking 15 minutes 1-2X a day now.    Currently in Pain? No/denies                             [facility] Adult PT Treatment/Exercise - 06/13/20 0001      Knee/Hip Exercises: Stretches   Active Hamstring Stretch Left;3 reps;30 seconds    Active Hamstring Stretch Limitations 12" step    Knee: Self-Stretch to increase Flexion 10 seconds    Knee: Self-Stretch Limitations 10 reps on 12" step      Knee/Hip Exercises: Standing   Heel Raises 15 reps    Knee Flexion Left;20 reps    Lateral Step Up Left;10  reps;Hand Hold: 1;Step Height: 4"    Forward Step Up 10 reps;Step Height: 4";Left;Hand Hold: 1      Knee/Hip Exercises: Seated   Sit to Sand 10 reps;without UE support   working on upright posturing not leaning forward     Knee/Hip Exercises: Supine   Quad Sets Left;5 reps    Heel Slides Left;5 reps    Knee Extension Left;AROM    Knee Extension Limitations 2    Knee Flexion Left;AROM    Knee Flexion Limitations 100   AAROM 102     Manual Therapy   Manual Therapy Myofascial release;Passive ROM    Manual therapy comments Manual complete separate rest of tx    Myofascial Release adhesions, scar tissue    Passive ROM contract relax 3X completed in Rt sidelying                  PT Education - 06/13/20 1310    Education Details educated on scar tissue, continuation of HEP and walking    Person(s) Educated Patient    Methods Explanation    Comprehension Verbalized understanding  PT Short Term Goals - 05/27/20 0902      PT SHORT TERM GOAL #1   Title Patient will be independent in self management strategies to improve quality of life and functional outcomes.    Time 3    Period Weeks    Status On-going    Target Date 06/15/20      PT SHORT TERM GOAL #2   Title Patient will be able to ambulate 226 feet in 2 minutes without AD to demonstrate improved walking mechanics    Time 3    Period Weeks    Status On-going    Target Date 06/15/20      PT SHORT TERM GOAL #3   Title Patient wil be able to transition from sit <-> stand without UE assist    Time 3    Period Weeks    Status On-going    Target Date 06/15/20             PT Long Term Goals - 05/27/20 0903      PT LONG TERM GOAL #1   Title Patient will be able to ascend and descend steps with use of railing as needed with reciprocal gait pattern to improve transitional mobility.    Time 6    Period Weeks    Status On-going      PT LONG TERM GOAL #2   Title Patient will improve on FOTO score to  meet predicted outcomes to demonstrate improved functional mobility.    Time 6    Period Weeks    Status On-going      PT LONG TERM GOAL #3   Title Patient will report at least 50% improvement in overall symptoms and/or function to demonstrate improved functional mobility    Time 6    Period Weeks    Status On-going      PT LONG TERM GOAL #4   Title Patient will demonstate 0-110 degress of left knee ROM    Time 6    Period Weeks    Status On-going                 Plan - 06/13/20 1312    Clinical Impression Statement Continued with primary focus on ROM, flexion >extension.  Decreased UE assistance with step ups today and cues for general posturing and slowing speed of activity. Added lunges to begin lead leg stabilization without use of UE's.   Pt briefly instructed with ambulation using SPC and able to demonstrate this correctly without cues needed.  Manual completed to reduce adhesions and scar tissue.  Several "pops" along incision line of released scar tissue during manual.  AROM to 100 degrees today, AAROM 102.  Extension only lacking 2 degrees but feel this is mainly due to residual edema.  sit to stands most challenging for pt as tends to push far foward without using UE's.  Verbal and tactile Cues to push from her hips/quads and keeping upright posturing throughout activity.   Pt is overall progressing well.    Personal Factors and Comorbidities Comorbidity 1    Comorbidities bilateral kne epain    Examination-Activity Limitations Bed Mobility;Bathing;Sit;Transfers;Locomotion Level;Stand;Stairs;Squat;Carry;Bend    Examination-Participation Restrictions Meal Prep;Driving;Shop;Community Activity;Cleaning    Stability/Clinical Decision Making Stable/Uncomplicated    Rehab Potential Good    PT Frequency 3x / week    PT Duration 6 weeks    PT Treatment/Interventions ADLs/Self Care Home Management;Aquatic Therapy;Cryotherapy;Electrical Stimulation;Iontophoresis 4mg /ml  Dexamethasone;Moist Heat;Traction;Balance training;Therapeutic exercise;Therapeutic activities;Functional mobility training;Stair training;Gait training;DME Instruction;Neuromuscular  re-education;Patient/family education;Manual techniques;Dry needling;Passive range of motion;Scar mobilization    PT Next Visit Plan Continue knee ROM and strengthening exercises. Edema management to reduce swelling and improve knee flexion.  Advance functional strengthening and stabiltiy.    PT Home Exercise Plan heel slides, elevation    Consulted and Agree with Plan of Care Patient           Patient will benefit from skilled therapeutic intervention in order to improve the following deficits and impairments:  Abnormal gait, Decreased endurance, Decreased skin integrity, Pain, Decreased strength, Decreased activity tolerance, Difficulty walking, Decreased mobility, Decreased balance, Decreased range of motion, Decreased safety awareness, Decreased knowledge of use of DME, Increased edema  Visit Diagnosis: Decreased range of motion (ROM) of left knee  Difficulty in walking, not elsewhere classified  Chronic pain of left knee     Problem List Patient Active Problem List   Diagnosis Date Noted  . Osteoarthritis of left knee 05/18/2020  . Anemia   . Lower abdominal pain   . Diverticulitis 08/28/2017  . Pain in the chest   . Chest pain 09/03/2014  . HTN (hypertension) 09/03/2014  . Hyperlipidemia 09/03/2014  . Pain in joint, shoulder region 07/10/2012  . 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  . Osteoarthrosis, unspecified whether generalized or localized, lower leg 04/21/2008  . KNEE PAIN 04/21/2008  . HIGH BLOOD PRESSURE 04/20/2008   Lurena Nida, PTA/CLT [phone]  Lurena Nida 06/13/2020, 1:17 PM  [place] [facility] [address] [place], [place], [address] Phone: [phone]   Fax:  [phone]  Name: Monique Wagner MRN: [id number] Date of  Birth: May 10, 1941

## 2020-06-15 ENCOUNTER — Other Ambulatory Visit: Payer: Self-pay

## 2020-06-15 ENCOUNTER — Ambulatory Visit (HOSPITAL_COMMUNITY): Payer: Medicare Other | Admitting: Physical Therapy

## 2020-06-15 DIAGNOSIS — M25562 Pain in left knee: Secondary | ICD-10-CM | POA: Diagnosis not present

## 2020-06-15 DIAGNOSIS — G8929 Other chronic pain: Secondary | ICD-10-CM

## 2020-06-15 DIAGNOSIS — R262 Difficulty in walking, not elsewhere classified: Secondary | ICD-10-CM

## 2020-06-15 DIAGNOSIS — M25662 Stiffness of left knee, not elsewhere classified: Secondary | ICD-10-CM

## 2020-06-15 NOTE — Therapy (Signed)
[facility] [address] [place], [place], [address] Phone: [phone]   Fax:  [phone]  Physical Therapy Treatment  Patient Details  Name: Monique Wagner MRN: [id number] Date of Birth: 10-Nov-1940 Referring Provider (PT): Samson Frederic   Encounter Date: 06/15/2020   PT End of Session - 06/15/20 1213    Visit Number 9    Number of Visits 18    Date for PT Re-Evaluation 07/06/20    Authorization Type BCBS  No auth, no VL, CP $25    Progress Note Due on Visit 10    PT Start Time 1134    PT Stop Time 1212    PT Time Calculation (min) 38 min    Activity Tolerance Patient tolerated treatment well    Behavior During Therapy [facility] for tasks assessed/performed           Past Medical History:  Diagnosis Date  . Arthritis   . Diverticulitis   . Dyspnea   . HTN (hypertension)   . Hyperlipidemia   . Stroke (HCC)    slight stroke 50 years ago - no deficits     Past Surgical History:  Procedure Laterality Date  . ABDOMINAL HYSTERECTOMY    . BREAST SURGERY    . 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   Subjective Assessment - 06/15/20 1127    Subjective pt states her Rt knee is giving her more of a fit than her Lt.    Currently not really hurting until she stretches or does alot of activity.    Currently in Pain? No/denies                             [facility] Adult PT Treatment/Exercise - 06/15/20 0001      Knee/Hip Exercises: Stretches   Active Hamstring Stretch Left;3 reps;30 seconds    Active Hamstring Stretch Limitations 12" step    Knee: Self-Stretch to increase Flexion 10 seconds    Knee: Self-Stretch Limitations 10 reps on 12" step      Knee/Hip Exercises: Standing   Heel Raises 20 reps    Knee Flexion Left;20 reps     Lateral Step Up Left;Hand Hold: 1;Step Height: 4";15 reps    Forward Step Up Step Height: 4";Left;Hand Hold: 1;15 reps      Knee/Hip Exercises: Seated   Sit to Sand 10 reps;without UE support   cues for upright posturing, not leaning forward     Knee/Hip Exercises: Supine   Quad Sets Left;5 reps    Heel Slides Left;5 reps    Knee Extension Left;AROM    Knee Extension Limitations 0    Knee Flexion Left;AROM    Knee Flexion Limitations 105                    PT Short Term Goals - 05/27/20 0902      PT SHORT TERM GOAL #1   Title Patient will be independent in self management strategies to improve quality of life and functional outcomes.    Time 3    Period Weeks    Status On-going    Target Date 06/15/20      PT SHORT TERM GOAL #2   Title Patient will be able to ambulate  226 feet in 2 minutes without AD to demonstrate improved walking mechanics    Time 3    Period Weeks    Status On-going    Target Date 06/15/20      PT SHORT TERM GOAL #3   Title Patient wil be able to transition from sit <-> stand without UE assist    Time 3    Period Weeks    Status On-going    Target Date 06/15/20             PT Long Term Goals - 05/27/20 0903      PT LONG TERM GOAL #1   Title Patient will be able to ascend and descend steps with use of railing as needed with reciprocal gait pattern to improve transitional mobility.    Time 6    Period Weeks    Status On-going      PT LONG TERM GOAL #2   Title Patient will improve on FOTO score to meet predicted outcomes to demonstrate improved functional mobility.    Time 6    Period Weeks    Status On-going      PT LONG TERM GOAL #3   Title Patient will report at least 50% improvement in overall symptoms and/or function to demonstrate improved functional mobility    Time 6    Period Weeks    Status On-going      PT LONG TERM GOAL #4   Title Patient will demonstate 0-110 degress of left knee ROM    Time 6    Period  Weeks    Status On-going                 Plan - 06/15/20 1216    Clinical Impression Statement Continued with focus on improving ROM and functional strengthening.  Added forward step down with overall good control, just cues for posturing and stability.  Sit to stands with cues for keeping upright posturing.  Pt tends to be more limited by her Rt knee rather than Lt.    ROM improved this session 0-105.  Continued tightness and adhesions around knee and scar line but improved with manual.  10th visit progress note is due next session.    Personal Factors and Comorbidities Comorbidity 1    Comorbidities bilateral kne epain    Examination-Activity Limitations Bed Mobility;Bathing;Sit;Transfers;Locomotion Level;Stand;Stairs;Squat;Carry;Bend    Examination-Participation Restrictions Meal Prep;Driving;Shop;Community Activity;Cleaning    Stability/Clinical Decision Making Stable/Uncomplicated    Rehab Potential Good    PT Frequency 3x / week    PT Duration 6 weeks    PT Treatment/Interventions ADLs/Self Care Home Management;Aquatic Therapy;Cryotherapy;Electrical Stimulation;Iontophoresis 4mg /ml Dexamethasone;Moist Heat;Traction;Balance training;Therapeutic exercise;Therapeutic activities;Functional mobility training;Stair training;Gait training;DME Instruction;Neuromuscular re-education;Patient/family education;Manual techniques;Dry needling;Passive range of motion;Scar mobilization    PT Next Visit Plan Continue knee ROM and strengthening exercises. Edema management to reduce swelling and improve knee flexion.  Advance functional strengthening and stabiltiy. complete 10th visit PN next session.    PT Home Exercise Plan heel slides, elevation    Consulted and Agree with Plan of Care Patient           Patient will benefit from skilled therapeutic intervention in order to improve the following deficits and impairments:  Abnormal gait, Decreased endurance, Decreased skin integrity, Pain,  Decreased strength, Decreased activity tolerance, Difficulty walking, Decreased mobility, Decreased balance, Decreased range of motion, Decreased safety awareness, Decreased knowledge of use of DME, Increased edema  Visit Diagnosis: Decreased range of motion (ROM) of left knee  Difficulty in walking, not elsewhere classified  Chronic pain of left knee     Problem List Patient Active Problem List   Diagnosis Date Noted  . Osteoarthritis of left knee 05/18/2020  . Anemia   . Lower abdominal pain   . Diverticulitis 08/28/2017  . Pain in the chest   . Chest pain 09/03/2014  . HTN (hypertension) 09/03/2014  . Hyperlipidemia 09/03/2014  . Pain in joint, shoulder region 07/10/2012  . 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  . Osteoarthrosis, unspecified whether generalized or localized, lower leg 04/21/2008  . KNEE PAIN 04/21/2008  . HIGH BLOOD PRESSURE 04/20/2008   Lurena Nida, PTA/CLT [phone]  Lurena Nida 06/15/2020, 12:16 PM  [facility] [address] [place], [place], [address] Phone: [phone]   Fax:  [phone]  Name: Monique Wagner MRN: [id number] Date of Birth: 1941-07-10

## 2020-06-17 ENCOUNTER — Other Ambulatory Visit: Payer: Self-pay

## 2020-06-17 ENCOUNTER — Ambulatory Visit (HOSPITAL_COMMUNITY): Payer: Medicare Other | Admitting: Physical Therapy

## 2020-06-17 ENCOUNTER — Encounter (HOSPITAL_COMMUNITY): Payer: Self-pay | Admitting: Physical Therapy

## 2020-06-17 DIAGNOSIS — M25662 Stiffness of left knee, not elsewhere classified: Secondary | ICD-10-CM

## 2020-06-17 DIAGNOSIS — R262 Difficulty in walking, not elsewhere classified: Secondary | ICD-10-CM

## 2020-06-17 DIAGNOSIS — G8929 Other chronic pain: Secondary | ICD-10-CM

## 2020-06-17 DIAGNOSIS — M25562 Pain in left knee: Secondary | ICD-10-CM | POA: Diagnosis not present

## 2020-06-17 NOTE — Patient Instructions (Addendum)
Hamstring Curl    Stand at countertop for support. Shift weight onto right leg, while bending left knee. Repeat _10__ times, alternating legs. Do __1-2_ times per day.  Copyright  [employer]HI. All rights reserved.   (Home) Squat: (Assist)    Using support at a countertop (not using chairs), arms close to body, squat by dropping hips back as if sitting in a chair. Repeat _10___ times per set. Do _1___ sets per session. Do _1-2___ sessions per week.  Copyright  VHI. All rights reserved.

## 2020-06-17 NOTE — Therapy (Signed)
[facility] [address] [place], [place], [address] Phone: [phone]   Fax:  [phone]  Physical Therapy Treatment / Progress Note  Patient Details  Name: Monique Wagner MRN: [id number] Date of Birth: 05/10/1941 Referring Provider (PT): Samson Frederic   Encounter Date: 06/17/2020  Progress Note Reporting Period 05/25/20 to 06/17/20  See note below for Objective Data and Assessment of Progress/Goals.         PT End of Session - 06/17/20 1123    Visit Number 10    Number of Visits 18    Date for PT Re-Evaluation 07/06/20    Authorization Type BCBS  No auth, no VL, CP $25    Progress Note Due on Visit 10    PT Start Time 1121    PT Stop Time 1200    PT Time Calculation (min) 39 min    Activity Tolerance Patient tolerated treatment well    Behavior During Therapy WFL for tasks assessed/performed           Past Medical History:  Diagnosis Date  . Arthritis   . Diverticulitis   . Dyspnea   . HTN (hypertension)   . Hyperlipidemia   . Stroke (HCC)    slight stroke 50 years ago - no deficits     Past Surgical History:  Procedure Laterality Date  . ABDOMINAL HYSTERECTOMY    . BREAST SURGERY    . 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   Subjective Assessment - 06/17/20 1122    Subjective Patient reports that she feels like she has been making great progress in therapy so far. She reports that she has the most difficulty when she is trying to sleep.    Currently in Pain? No/denies              [facility] PT Assessment - 06/17/20 0001      Assessment   Medical Diagnosis L TKA    Referring Provider (PT) Arlys John Swinteck    Onset Date/Surgical Date 05/18/20    Next MD Visit 06/28/20      Observation/Other Assessments   Focus on  Therapeutic Outcomes (FOTO)  54%   was 38%     AROM   Left Knee Extension 0    Left Knee Flexion 99      Ambulation/Gait   Ambulation/Gait Yes    Ambulation Distance (Feet) 390 Feet     Assistive device None    Stairs No    Gait Comments Attempted 7'' stairs, too difficult                         [facility] Adult PT Treatment/Exercise - 06/17/20 0001      Knee/Hip Exercises: Standing   Knee Flexion Left;20 reps    Lateral Step Up Left;Hand Hold: 1;Step Height: 4";10 reps    Forward Step Up Step Height: 4";Left;Hand Hold: 1;10 reps      Knee/Hip Exercises: Supine   Heel Slides AROM;Left;1 set;10 reps                  PT Education - 06/17/20 1203    Education Details Discussed re-assessment findings, updated HEP, and POC.    Person(s) Educated Patient    Methods Explanation;Handout    Comprehension  Verbalized understanding;Returned demonstration            PT Short Term Goals - 06/17/20 1124      PT SHORT TERM GOAL #1   Title Patient will be independent in self management strategies to improve quality of life and functional outcomes.    Baseline 06/17/20: Patient reports she has been doing HEP    Time 3    Period Weeks    Status Achieved    Target Date 06/15/20      PT SHORT TERM GOAL #2   Title Patient will be able to ambulate 226 feet in 2 minutes without AD to demonstrate improved walking mechanics    Time 3    Period Weeks    Status Achieved    Target Date 06/15/20      PT SHORT TERM GOAL #3   Title Patient wil be able to transition from sit <-> stand without UE assist    Baseline 06/17/20: Patient able to perform partial ROM before using UE to attain standing    Time 3    Period Weeks    Status On-going    Target Date 06/15/20             PT Long Term Goals - 06/17/20 1133      PT LONG TERM GOAL #1   Title Patient will be able to ascend and descend steps with use of railing as needed with reciprocal gait pattern to improve  transitional mobility.    Baseline 06/17/20: Unable currently    Time 6    Period Weeks    Status On-going      PT LONG TERM GOAL #2   Title Patient will improve on FOTO score to meet predicted outcomes to demonstrate improved functional mobility.    Baseline 06/17/20: Predicst 36% limitations at 46% currently    Time 6    Period Weeks    Status On-going      PT LONG TERM GOAL #3   Title Patient will report at least 50% improvement in overall symptoms and/or function to demonstrate improved functional mobility    Time 6    Period Weeks    Status On-going      PT LONG TERM GOAL #4   Title Patient will demonstate 0-110 degress of left knee ROM    Baseline 06/17/20: Patient has 0-99 degrees AROM on the LT knee    Time 6    Period Weeks    Status On-going                 Plan - 06/17/20 1234    Clinical Impression Statement This session performed a re-assessment of patient's progress towards goals. Patient achieved 2 out of 3 short term goals and is ongoing towards remaining long term goals. Patient demonstrates continued deficits in LT knee flexion AROM although she has made good progress since evaluation on this. In addition, patient is limited in functional strength, particularly patient is unable to ascend and descend stairs at this time. Focused on functional strength and educating patient on an updated HEP emphasizing use of a countertop for support with exercises. Patient would benefit from skilled physical therapy for an additional 3 weeks to continue addressing remaining deficits and return to PLOF.    Personal Factors and Comorbidities Comorbidity 1    Comorbidities bilateral kne epain    Examination-Activity Limitations Bed Mobility;Bathing;Sit;Transfers;Locomotion Level;Stand;Stairs;Squat;Carry;Bend    Examination-Participation Restrictions Meal Prep;Driving;Shop;Community Activity;Cleaning    Stability/Clinical Decision Making Stable/Uncomplicated  Rehab Potential  Good    PT Frequency 3x / week    PT Duration 6 weeks    PT Treatment/Interventions ADLs/Self Care Home Management;Aquatic Therapy;Cryotherapy;Electrical Stimulation;Iontophoresis 4mg /ml Dexamethasone;Moist Heat;Traction;Balance training;Therapeutic exercise;Therapeutic activities;Functional mobility training;Stair training;Gait training;DME Instruction;Neuromuscular re-education;Patient/family education;Manual techniques;Dry needling;Passive range of motion;Scar mobilization    PT Next Visit Plan Continue knee ROM and strengthening exercises. Edema management to reduce swelling and improve knee flexion.  Advance functional strengthening and stabiltiy.    PT Home Exercise Plan heel slides, elevation; 06/17/20: Functional squats, standing knee flexion    Consulted and Agree with Plan of Care Patient           Patient will benefit from skilled therapeutic intervention in order to improve the following deficits and impairments:  Abnormal gait, Decreased endurance, Decreased skin integrity, Pain, Decreased strength, Decreased activity tolerance, Difficulty walking, Decreased mobility, Decreased balance, Decreased range of motion, Decreased safety awareness, Decreased knowledge of use of DME, Increased edema  Visit Diagnosis: Decreased range of motion (ROM) of left knee  Difficulty in walking, not elsewhere classified  Chronic pain of left knee     Problem List Patient Active Problem List   Diagnosis Date Noted  . Osteoarthritis of left knee 05/18/2020  . Anemia   . Lower abdominal pain   . Diverticulitis 08/28/2017  . Pain in the chest   . Chest pain 09/03/2014  . HTN (hypertension) 09/03/2014  . Hyperlipidemia 09/03/2014  . Pain in joint, shoulder region 07/10/2012  . 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  . Osteoarthrosis, unspecified whether generalized or localized, lower leg 04/21/2008  . KNEE PAIN 04/21/2008  . HIGH BLOOD PRESSURE 04/20/2008   04/22/2008 PT, DPT 12:39 PM,  06/17/20 [phone]  [facility] [address] [place], [place], [place] Phone: [phone]   Fax:  [phone]  Name: Monique Wagner MRN: Augustine Radar Date of Birth: [birth year]

## 2020-06-20 ENCOUNTER — Other Ambulatory Visit: Payer: Self-pay

## 2020-06-20 ENCOUNTER — Ambulatory Visit (HOSPITAL_COMMUNITY): Payer: Medicare Other | Admitting: Physical Therapy

## 2020-06-20 DIAGNOSIS — M25562 Pain in left knee: Secondary | ICD-10-CM | POA: Diagnosis not present

## 2020-06-20 DIAGNOSIS — R262 Difficulty in walking, not elsewhere classified: Secondary | ICD-10-CM

## 2020-06-20 DIAGNOSIS — M25662 Stiffness of left knee, not elsewhere classified: Secondary | ICD-10-CM

## 2020-06-20 NOTE — Therapy (Signed)
[facility] [address] [place], [place], [address] Phone: [phone]   Fax:  [phone]  Physical Therapy Treatment  Patient Details  Name: Monique Wagner MRN: [id number] Date of Birth: 09-15-1941 Referring Provider (PT): Samson Frederic   Encounter Date: 06/20/2020   PT End of Session - 06/20/20 1237    Visit Number 11    Number of Visits 18    Date for PT Re-Evaluation 07/06/20    Authorization Type BCBS  No auth, no VL, CP $25    Progress Note Due on Visit 10    PT Start Time 1136    PT Stop Time 1215    PT Time Calculation (min) 39 min    Activity Tolerance Patient tolerated treatment well    Behavior During Therapy [facility] for tasks assessed/performed           Past Medical History:  Diagnosis Date  . Arthritis   . Diverticulitis   . Dyspnea   . HTN (hypertension)   . Hyperlipidemia   . Stroke (HCC)    slight stroke 50 years ago - no deficits     Past Surgical History:  Procedure Laterality Date  . ABDOMINAL HYSTERECTOMY    . BREAST SURGERY    . 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   Subjective Assessment - 06/20/20 1142    Subjective Pt states she was disappointment not to get more bend than 99 degrees last session.  No pain currently or issues.    Currently in Pain? No/denies                             [facility] Adult PT Treatment/Exercise - 06/20/20 0001      Knee/Hip Exercises: Stretches   Knee: Self-Stretch to increase Flexion 10 seconds    Knee: Self-Stretch Limitations 10 reps on 12" step    Gastroc Stretch Both;3 reps;30 seconds    Gastroc Stretch Limitations slant board      Knee/Hip Exercises: Standing   Knee Flexion Left;20 reps    Lateral Step Up Left;Hand Hold: 1;Step Height: 4";15 reps     Forward Step Up Step Height: 4";Left;Hand Hold: 1;15 reps      Knee/Hip Exercises: Supine   Heel Slides AROM;Left;1 set;10 reps    Knee Extension Left;AROM    Knee Extension Limitations 0    Knee Flexion Left;AROM    Knee Flexion Limitations 98 AROM, 102 AAROM, 105 PROM      Manual Therapy   Manual Therapy Myofascial release;Passive ROM    Manual therapy comments Manual complete separate rest of tx    Edema Management to Lt knee     Soft tissue mobilization to reduce tightness in scar line and permeiter of knee    Myofascial Release adhesions, scar tissue    Passive ROM contract relax 3X completed in Rt sidelying                    PT Short Term Goals - 06/17/20 1124      PT SHORT TERM GOAL #1   Title Patient will be independent in self management strategies to improve quality of life and functional outcomes.    Baseline 06/17/20: Patient reports she has been doing HEP  Time 3    Period Weeks    Status Achieved    Target Date 06/15/20      PT SHORT TERM GOAL #2   Title Patient will be able to ambulate 226 feet in 2 minutes without AD to demonstrate improved walking mechanics    Time 3    Period Weeks    Status Achieved    Target Date 06/15/20      PT SHORT TERM GOAL #3   Title Patient wil be able to transition from sit <-> stand without UE assist    Baseline 06/17/20: Patient able to perform partial ROM before using UE to attain standing    Time 3    Period Weeks    Status On-going    Target Date 06/15/20             PT Long Term Goals - 06/17/20 1133      PT LONG TERM GOAL #1   Title Patient will be able to ascend and descend steps with use of railing as needed with reciprocal gait pattern to improve transitional mobility.    Baseline 06/17/20: Unable currently    Time 6    Period Weeks    Status On-going      PT LONG TERM GOAL #2   Title Patient will improve on FOTO score to meet predicted outcomes to demonstrate improved functional mobility.     Baseline 06/17/20: Predicst 36% limitations at 46% currently    Time 6    Period Weeks    Status On-going      PT LONG TERM GOAL #3   Title Patient will report at least 50% improvement in overall symptoms and/or function to demonstrate improved functional mobility    Time 6    Period Weeks    Status On-going      PT LONG TERM GOAL #4   Title Patient will demonstate 0-110 degress of left knee ROM    Baseline 06/17/20: Patient has 0-99 degrees AROM on the LT knee    Time 6    Period Weeks    Status On-going                 Plan - 06/20/20 1239    Clinical Impression Statement cotinued with focus on improving AROM of Lt knee.  Pt now ambulating more without AD than with SPC.  manual continued with multiple areas of adhesions released.  Resultand AAROM of 102 and PROM of 105 following.  Encouraged aggressive ROM at home with verbalized understanding.    Personal Factors and Comorbidities Comorbidity 1    Comorbidities bilateral kne epain    Examination-Activity Limitations Bed Mobility;Bathing;Sit;Transfers;Locomotion Level;Stand;Stairs;Squat;Carry;Bend    Examination-Participation Restrictions Meal Prep;Driving;Shop;Community Activity;Cleaning    Stability/Clinical Decision Making Stable/Uncomplicated    Rehab Potential Good    PT Frequency 3x / week    PT Duration 6 weeks    PT Treatment/Interventions ADLs/Self Care Home Management;Aquatic Therapy;Cryotherapy;Electrical Stimulation;Iontophoresis 4mg /ml Dexamethasone;Moist Heat;Traction;Balance training;Therapeutic exercise;Therapeutic activities;Functional mobility training;Stair training;Gait training;DME Instruction;Neuromuscular re-education;Patient/family education;Manual techniques;Dry needling;Passive range of motion;Scar mobilization    PT Next Visit Plan Continue knee ROM and strengthening exercises.  Manual to improve knee flexion.  Advance functional strengthening and stabiltiy.    PT Home Exercise Plan heel slides,  elevation; 06/17/20: Functional squats, standing knee flexion    Consulted and Agree with Plan of Care Patient           Patient will benefit from skilled therapeutic intervention in order to improve the  following deficits and impairments:  Abnormal gait, Decreased endurance, Decreased skin integrity, Pain, Decreased strength, Decreased activity tolerance, Difficulty walking, Decreased mobility, Decreased balance, Decreased range of motion, Decreased safety awareness, Decreased knowledge of use of DME, Increased edema  Visit Diagnosis: Decreased range of motion (ROM) of left knee  Difficulty in walking, not elsewhere classified  Chronic pain of left knee     Problem List Patient Active Problem List   Diagnosis Date Noted  . Osteoarthritis of left knee 05/18/2020  . Anemia   . Lower abdominal pain   . Diverticulitis 08/28/2017  . Pain in the chest   . Chest pain 09/03/2014  . HTN (hypertension) 09/03/2014  . Hyperlipidemia 09/03/2014  . Pain in joint, shoulder region 07/10/2012  . 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  . Osteoarthrosis, unspecified whether generalized or localized, lower leg 04/21/2008  . KNEE PAIN 04/21/2008  . HIGH BLOOD PRESSURE 04/20/2008   .Lurena Nida, PTA/CLT [phone]  Lurena Nida 06/20/2020, 1:10 PM  [facility] [address] [place], [place], [address] Phone: [phone]   Fax:  [phone]  Name: TYLISHA DANIS MRN: [id number] Date of Birth: 01/21/1941

## 2020-06-22 ENCOUNTER — Other Ambulatory Visit: Payer: Self-pay

## 2020-06-22 ENCOUNTER — Ambulatory Visit (HOSPITAL_COMMUNITY): Payer: Medicare Other | Admitting: Physical Therapy

## 2020-06-22 DIAGNOSIS — M25662 Stiffness of left knee, not elsewhere classified: Secondary | ICD-10-CM

## 2020-06-22 DIAGNOSIS — M25562 Pain in left knee: Secondary | ICD-10-CM

## 2020-06-22 DIAGNOSIS — R262 Difficulty in walking, not elsewhere classified: Secondary | ICD-10-CM

## 2020-06-22 NOTE — Therapy (Signed)
[facility] [address] [place], [place], [address] Phone: [phone]   Fax:  [phone]  Physical Therapy Treatment  Patient Details  Name: Monique Wagner MRN: [id number] Date of Birth: 09/02/1941 Referring Provider (PT): Samson Frederic   Encounter Date: 06/22/2020   PT End of Session - 06/22/20 1200    Visit Number 12    Number of Visits 18    Date for PT Re-Evaluation 07/06/20    Authorization Type BCBS  No auth, no VL, CP $25    Progress Note Due on Visit 13    PT Start Time 1134    PT Stop Time 1213    PT Time Calculation (min) 39 min    Activity Tolerance Patient tolerated treatment well    Behavior During Therapy [facility] for tasks assessed/performed           Past Medical History:  Diagnosis Date  . Arthritis   . Diverticulitis   . Dyspnea   . HTN (hypertension)   . Hyperlipidemia   . Stroke (HCC)    slight stroke 50 years ago - no deficits     Past Surgical History:  Procedure Laterality Date  . ABDOMINAL HYSTERECTOMY    . BREAST SURGERY    . 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   Subjective Assessment - 06/22/20 1137    Subjective Pt states that she is doing well.  She is just carrying her cane at this time.  She is able to do everything that she wants to.    How long can you sit comfortably? no problem    How long can you stand comfortably? no limitations    How long can you walk comfortably? able to ambulate without her cane    Currently in Pain? No/denies                             [facility] Adult PT Treatment/Exercise - 06/22/20 0001      Exercises   Exercises Knee/Hip      Knee/Hip Exercises: Standing   Lateral Step Up Left;10 reps    Step Down Left;10 reps    Stairs 4RT :due to severe OAin RT  pt goes up one step at a time leading with Lt going up ; RT going down     SLS 3x      Knee/Hip Exercises: Supine   Quad Sets Left;10 reps    Heel Slides 15 reps    Knee Extension Limitations 0    Knee Flexion Limitations 108 AROM      Manual Therapy   Manual Therapy Myofascial release;Passive ROM    Manual therapy comments Manual complete separate rest of tx    Edema Management to Lt knee     Soft tissue mobilization to reduce tightness in scar line and permeiter of knee    Myofascial Release adhesions, scar tissue    Passive ROM contract relax 3X completed in Rt sidelying                    PT Short Term Goals - 06/22/20 1143      PT SHORT TERM GOAL #1   Title Patient will be independent in self management strategies to improve quality of  life and functional outcomes.    Baseline 06/17/20: Patient reports she has been doing HEP    Time 3    Period Weeks    Status Achieved    Target Date 06/15/20      PT SHORT TERM GOAL #2   Title Patient will be able to ambulate 226 feet in 2 minutes without AD to demonstrate improved walking mechanics    Time 3    Period Weeks    Status Achieved    Target Date 06/15/20      PT SHORT TERM GOAL #3   Title Patient wil be able to transition from sit <-> stand without UE assist    Baseline 06/17/20: Patient able to perform partial ROM before using UE to attain standing    Time 3    Period Weeks    Status Achieved    Target Date 06/15/20             PT Long Term Goals - 06/22/20 1146      PT LONG TERM GOAL #1   Title Patient will be able to ascend and descend steps with use of railing as needed with reciprocal gait pattern to improve transitional mobility.    Baseline 06/17/20: Unable currently; 9/22: Pt unable due to OA in Rt knee goes up leading with LT down leading with  RT    Time 6    Period Weeks    Status Revised      PT LONG TERM GOAL #2   Title Patient will improve on FOTO score to meet predicted outcomes to  demonstrate improved functional mobility.    Baseline 06/17/20: Predicst 36% limitations at 46% currently    Time 6    Period Weeks    Status On-going      PT LONG TERM GOAL #3   Title Patient will report at least 50% improvement in overall symptoms and/or function to demonstrate improved functional mobility    Baseline Lt states 100 %    Time 6    Period Weeks    Status Achieved      PT LONG TERM GOAL #4   Title Patient will demonstate 0-110 degress of left knee ROM    Baseline 06/17/20: Patient has 0-99 degrees AROM on the LT knee    Time 6    Period Weeks    Status On-going                 Plan - 06/22/20 1214    Clinical Impression Statement Pt had significant improvement of ROM with pt able to achieve 110 Actively today.  Attempted reciprocal stair climbing which pt would be able to complete except that she is having increased pain and weakness on Rt knee,(in need of a TKR as well).    Personal Factors and Comorbidities Comorbidity 1    Comorbidities bilateral kne epain    Examination-Activity Limitations Bed Mobility;Bathing;Sit;Transfers;Locomotion Level;Stand;Stairs;Squat;Carry;Bend    Examination-Participation Restrictions Meal Prep;Driving;Shop;Community Activity;Cleaning    Stability/Clinical Decision Making Stable/Uncomplicated    Rehab Potential Good    PT Frequency 3x / week    PT Duration 6 weeks    PT Treatment/Interventions ADLs/Self Care Home Management;Aquatic Therapy;Cryotherapy;Electrical Stimulation;Iontophoresis 4mg /ml Dexamethasone;Moist Heat;Traction;Balance training;Therapeutic exercise;Therapeutic activities;Functional mobility training;Stair training;Gait training;DME Instruction;Neuromuscular re-education;Patient/family education;Manual techniques;Dry needling;Passive range of motion;Scar mobilization    PT Next Visit Plan Pt will be seen be evaluating therapist.  Friday is her last scheduled appointment.  If pt increases flexion to 115 she may be  ready for discharge with pt working on Range on her own at home.  Evaluating therapist to assess.    PT Home Exercise Plan heel slides, elevation; 06/17/20: Functional squats, standing knee flexion    Consulted and Agree with Plan of Care Patient           Patient will benefit from skilled therapeutic intervention in order to improve the following deficits and impairments:  Abnormal gait, Decreased endurance, Decreased skin integrity, Pain, Decreased strength, Decreased activity tolerance, Difficulty walking, Decreased mobility, Decreased balance, Decreased range of motion, Decreased safety awareness, Decreased knowledge of use of DME, Increased edema  Visit Diagnosis: Decreased range of motion (ROM) of left knee  Difficulty in walking, not elsewhere classified  Chronic pain of left knee     Problem List Patient Active Problem List   Diagnosis Date Noted  . Osteoarthritis of left knee 05/18/2020  . Anemia   . Lower abdominal pain   . Diverticulitis 08/28/2017  . Pain in the chest   . Chest pain 09/03/2014  . HTN (hypertension) 09/03/2014  . Hyperlipidemia 09/03/2014  . Pain in joint, shoulder region 07/10/2012  . 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  . Osteoarthrosis, unspecified whether generalized or localized, lower leg 04/21/2008  . KNEE PAIN 04/21/2008  . HIGH BLOOD PRESSURE 04/20/2008    Virgina Organ, PT CLT [phone] 06/22/2020, 12:18 PM  [facility] [address] [place], [place], [address] Phone: [phone]   Fax:  [phone]  Name: Monique Wagner MRN: [id number] Date of Birth: 1940/11/20

## 2020-06-24 ENCOUNTER — Other Ambulatory Visit: Payer: Self-pay

## 2020-06-24 ENCOUNTER — Encounter (HOSPITAL_COMMUNITY): Payer: Self-pay | Admitting: Physical Therapy

## 2020-06-24 ENCOUNTER — Ambulatory Visit (HOSPITAL_COMMUNITY): Payer: Medicare Other | Admitting: Physical Therapy

## 2020-06-24 DIAGNOSIS — M25662 Stiffness of left knee, not elsewhere classified: Secondary | ICD-10-CM

## 2020-06-24 DIAGNOSIS — M25562 Pain in left knee: Secondary | ICD-10-CM

## 2020-06-24 DIAGNOSIS — G8929 Other chronic pain: Secondary | ICD-10-CM

## 2020-06-24 DIAGNOSIS — R262 Difficulty in walking, not elsewhere classified: Secondary | ICD-10-CM

## 2020-06-24 NOTE — Therapy (Signed)
[facility] [address] [place], [place], [address] Phone: [phone]   Fax:  [phone]  Physical Therapy Treatment  Patient Details  Name: Monique Wagner MRN: [id number] Date of Birth: Jul 19, 1941 Referring Provider (PT): Samson Frederic   Encounter Date: 06/24/2020   PT End of Session - 06/24/20 1106    Visit Number 13    Number of Visits 18    Date for PT Re-Evaluation 07/06/20    Authorization Type BCBS  No auth, no VL, CP $25    Progress Note Due on Visit 20    PT Start Time 1108    PT Stop Time 1148    PT Time Calculation (min) 40 min    Activity Tolerance Patient tolerated treatment well    Behavior During Therapy [facility] for tasks assessed/performed           Past Medical History:  Diagnosis Date  . Arthritis   . Diverticulitis   . Dyspnea   . HTN (hypertension)   . Hyperlipidemia   . Stroke (HCC)    slight stroke 50 years ago - no deficits     Past Surgical History:  Procedure Laterality Date  . ABDOMINAL HYSTERECTOMY    . BREAST SURGERY    . 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   Subjective Assessment - 06/24/20 1110    Subjective Patient states she feels 99% better. States she does her exercises daily. Reports no pain just minimal stiffness in the morning. States she drove for the first time yesterday with no difficulties    How long can you sit comfortably? no problem    How long can you stand comfortably? no limitations    How long can you walk comfortably? able to ambulate without her cane    Currently in Pain? No/denies              [facility] PT Assessment - 06/24/20 0001      Assessment   Medical Diagnosis L TKA    Referring Provider (PT) Arlys John Swinteck    Onset Date/Surgical Date 05/18/20    Next MD Visit  06/28/20      Observation/Other Assessments   Focus on Therapeutic Outcomes (FOTO)  53% function   predicted 64%     ROM / Strength   AROM / PROM / Strength Strength      AROM   Left Knee Extension 0      Strength   Strength Assessment Site Knee    Right/Left Knee Left;Right    Right Knee Flexion 4+/5    Right Knee Extension 4+/5    Left Knee Flexion 4+/5    Left Knee Extension 4+/5                         [facility] Adult PT Treatment/Exercise - 06/24/20 0001      Ambulation/Gait   Stairs Yes    Number of Stairs 7    Height of Stairs 4    Gait Comments step through pattern - one railing 2x 5 reps - right knee more challening unable to perform on 7" steps due to right knee      Knee/Hip Exercises: Standing   Other Standing Knee Exercises knee flexion and extension L on 12" step x5  20" holds      Knee/Hip Exercises: Supine   Heel Slides Left;5 reps   wiht strap 60" holds    Knee Extension Left;AROM    Knee Extension Limitations 0    Knee Flexion Left;AROM    Knee Flexion Limitations 110                  PT Education - 06/24/20 1232    Education Details discussed current plan, HEP , focus moving forward with patient.    Person(s) Educated Patient    Methods Explanation    Comprehension Verbalized understanding            PT Short Term Goals - 06/22/20 1143      PT SHORT TERM GOAL #1   Title Patient will be independent in self management strategies to improve quality of life and functional outcomes.    Baseline 06/17/20: Patient reports she has been doing HEP    Time 3    Period Weeks    Status Achieved    Target Date 06/15/20      PT SHORT TERM GOAL #2   Title Patient will be able to ambulate 226 feet in 2 minutes without AD to demonstrate improved walking mechanics    Time 3    Period Weeks    Status Achieved    Target Date 06/15/20      PT SHORT TERM GOAL #3   Title Patient wil be able to transition from sit <-> stand without UE  assist    Baseline 06/17/20: Patient able to perform partial ROM before using UE to attain standing    Time 3    Period Weeks    Status Achieved    Target Date 06/15/20             PT Long Term Goals - 06/22/20 1146      PT LONG TERM GOAL #1   Title Patient will be able to ascend and descend steps with use of railing as needed with reciprocal gait pattern to improve transitional mobility.    Baseline 06/17/20: Unable currently; 9/22: Pt unable due to OA in Rt knee goes up leading with LT down leading with  RT    Time 6    Period Weeks    Status Revised      PT LONG TERM GOAL #2   Title Patient will improve on FOTO score to meet predicted outcomes to demonstrate improved functional mobility.    Baseline 06/17/20: Predicst 36% limitations at 46% currently    Time 6    Period Weeks    Status On-going      PT LONG TERM GOAL #3   Title Patient will report at least 50% improvement in overall symptoms and/or function to demonstrate improved functional mobility    Baseline Lt states 100 %    Time 6    Period Weeks    Status Achieved      PT LONG TERM GOAL #4   Title Patient will demonstate 0-110 degress of left knee ROM    Baseline 06/17/20: Patient has 0-99 degrees AROM on the LT knee    Time 6    Period Weeks    Status On-going                 Plan - 06/24/20 1107    Clinical Impression Statement Patient continues to have ROM restrictions and desires to continue with therapy for a few more sessions to focus on knee  flexion and function. Current plan of care still within certification dates, will continue with set POC. Overall strength is good but continues to lack functional knee flexion on surgical leg and on non-surgical leg (right leg limits her ability to ascend taller stairs). Focus will be on knee flexion and establishing strong program for patient to continue to work on this at home. 110 degrees of flexion achieved today with belt. Will continue with current plan.     Personal Factors and Comorbidities Comorbidity 1    Comorbidities bilateral kne epain    Examination-Activity Limitations Bed Mobility;Bathing;Sit;Transfers;Locomotion Level;Stand;Stairs;Squat;Carry;Bend    Examination-Participation Restrictions Meal Prep;Driving;Shop;Community Activity;Cleaning    Stability/Clinical Decision Making Stable/Uncomplicated    Rehab Potential Good    PT Frequency 3x / week    PT Duration 6 weeks    PT Treatment/Interventions ADLs/Self Care Home Management;Aquatic Therapy;Cryotherapy;Electrical Stimulation;Iontophoresis 4mg /ml Dexamethasone;Moist Heat;Traction;Balance training;Therapeutic exercise;Therapeutic activities;Functional mobility training;Stair training;Gait training;DME Instruction;Neuromuscular re-education;Patient/family education;Manual techniques;Dry needling;Passive range of motion;Scar mobilization    PT Next Visit Plan focus on knee flexion on left LE - goal 115-120 degrees    PT Home Exercise Plan heel slides, elevation; 06/17/20: Functional squats, standing knee flexion; 9/24 supine heel slide with strap    Consulted and Agree with Plan of Care Patient           Patient will benefit from skilled therapeutic intervention in order to improve the following deficits and impairments:  Abnormal gait, Decreased endurance, Decreased skin integrity, Pain, Decreased strength, Decreased activity tolerance, Difficulty walking, Decreased mobility, Decreased balance, Decreased range of motion, Decreased safety awareness, Decreased knowledge of use of DME, Increased edema  Visit Diagnosis: Decreased range of motion (ROM) of left knee  Difficulty in walking, not elsewhere classified  Chronic pain of left knee     Problem List Patient Active Problem List   Diagnosis Date Noted  . Osteoarthritis of left knee 05/18/2020  . Anemia   . Lower abdominal pain   . Diverticulitis 08/28/2017  . Pain in the chest   . Chest pain 09/03/2014  . HTN  (hypertension) 09/03/2014  . Hyperlipidemia 09/03/2014  . Pain in joint, shoulder region 07/10/2012  . 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  . Osteoarthrosis, unspecified whether generalized or localized, lower leg 04/21/2008  . KNEE PAIN 04/21/2008  . HIGH BLOOD PRESSURE 04/20/2008   12:38 PM, 06/24/20 06/26/20, DPT Physical Therapy with [facility]  [phone] office  [facility] [address] [place], [place], [place] Phone: [phone]   Fax:  [phone]  Name: Monique Wagner MRN: [id number] Radar Date of Birth: August 06, 1941

## 2020-06-27 ENCOUNTER — Other Ambulatory Visit: Payer: Self-pay

## 2020-06-27 ENCOUNTER — Ambulatory Visit (HOSPITAL_COMMUNITY): Payer: Medicare Other | Admitting: Physical Therapy

## 2020-06-27 DIAGNOSIS — M25562 Pain in left knee: Secondary | ICD-10-CM | POA: Diagnosis not present

## 2020-06-27 DIAGNOSIS — M25662 Stiffness of left knee, not elsewhere classified: Secondary | ICD-10-CM

## 2020-06-27 DIAGNOSIS — R262 Difficulty in walking, not elsewhere classified: Secondary | ICD-10-CM

## 2020-06-27 NOTE — Therapy (Addendum)
[place], [place], [address] Phone: [phone]   Fax:  [phone]  Physical Therapy Treatment and Discharge   Patient Details  Name: Monique Wagner MRN: [id number] Date of Birth: Jun 05, 1941 Referring Provider (PT): Rod Can   PHYSICAL THERAPY DISCHARGE SUMMARY  Visits from Start of Care: 14  Current functional level related to goals / functional outcomes: Unable to assess secondary to unplanned discharge   Remaining deficits: Unable to assess secondary to unplanned discharge   Education / Equipment: Unable to assess secondary to unplanned discharge Plan: Patient agrees to discharge.  Patient goals were partially met. Patient is being discharged due to not returning since the last visit.  ?????         3:49 PM, 07/13/20 [name], DPT Physical Therapy with [facility]  [phone] office    Encounter Date: 06/27/2020   PT End of Session - 06/27/20 1316    Visit Number 14    Number of Visits 18    Date for PT Re-Evaluation 07/06/20    Authorization Type BCBS  No auth, no VL, CP $25    Progress Note Due on Visit 20    PT Start Time 1125    PT Stop Time 1200    PT Time Calculation (min) 35 min    Activity Tolerance Patient tolerated treatment well    Behavior During Therapy WFL for tasks assessed/performed           Past Medical History:  Diagnosis Date  . Arthritis   . Diverticulitis   . Dyspnea   . HTN (hypertension)   . Hyperlipidemia   . Stroke ([place])    slight stroke 50 years ago - no deficits     Past Surgical History:  Procedure Laterality Date  . ABDOMINAL HYSTERECTOMY    . BREAST SURGERY    . 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   Subjective  Assessment - 06/27/20 1307    Subjective pt states she has no pain or issues with Lt LE; it is now just the Rt knee that bothers her.  States she returns to MD tomorrow and unless he wants her to continue she will most likely stop therapy.  States she is happy with the ROM she has.    Currently in Pain? No/denies                             [facility] Adult PT Treatment/Exercise - 06/27/20 0001      Knee/Hip Exercises: Stretches   Knee: Self-Stretch to increase Flexion 10 seconds    Knee: Self-Stretch Limitations 10 reps on 12" step    Gastroc Stretch Both;3 reps;30 seconds    Gastroc Stretch Limitations slant board      Knee/Hip Exercises: Standing   Lateral Step Up Left;Hand Hold: 1;Step Height: 6";2 sets;10 reps    Lateral Step Up Limitations eccentric lowering    Forward Step Up Left;2 sets;10 reps;Hand Hold: 1;Step Height: 6"      Knee/Hip Exercises: Supine   Heel Slides Left;10 reps    Knee Extension Left;AROM    Knee Extension Limitations 0    Knee Flexion Left;AROM    Knee Flexion Limitations 108  Manual Therapy   Manual Therapy Myofascial release    Manual therapy comments Manual complete separate rest of tx    Edema Management to Lt knee     Soft tissue mobilization to reduce tightness in scar line and permeiter of knee    Myofascial Release adhesions, scar tissue    Passive ROM contract relax 3X completed in Rt sidelying                    PT Short Term Goals - 06/22/20 1143      PT SHORT TERM GOAL #1   Title Patient will be independent in self management strategies to improve quality of life and functional outcomes.    Baseline 06/17/20: Patient reports she has been doing HEP    Time 3    Period Weeks    Status Achieved    Target Date 06/15/20      PT SHORT TERM GOAL #2   Title Patient will be able to ambulate 226 feet in 2 minutes without AD to demonstrate improved walking mechanics    Time 3    Period Weeks    Status Achieved     Target Date 06/15/20      PT SHORT TERM GOAL #3   Title Patient wil be able to transition from sit <-> stand without UE assist    Baseline 06/17/20: Patient able to perform partial ROM before using UE to attain standing    Time 3    Period Weeks    Status Achieved    Target Date 06/15/20             PT Long Term Goals - 06/22/20 1146      PT LONG TERM GOAL #1   Title Patient will be able to ascend and descend steps with use of railing as needed with reciprocal gait pattern to improve transitional mobility.    Baseline 06/17/20: Unable currently; 9/22: Pt unable due to OA in Rt knee goes up leading with LT down leading with  RT    Time 6    Period Weeks    Status Revised      PT LONG TERM GOAL #2   Title Patient will improve on FOTO score to meet predicted outcomes to demonstrate improved functional mobility.    Baseline 06/17/20: Predicst 36% limitations at 46% currently    Time 6    Period Weeks    Status On-going      PT LONG TERM GOAL #3   Title Patient will report at least 50% improvement in overall symptoms and/or function to demonstrate improved functional mobility    Baseline Lt states 100 %    Time 6    Period Weeks    Status Achieved      PT LONG TERM GOAL #4   Title Patient will demonstate 0-110 degress of left knee ROM    Baseline 06/17/20: Patient has 0-99 degrees AROM on the LT knee    Time 6    Period Weeks    Status On-going                 Plan - 06/27/20 1318    Clinical Impression Statement Focus continued on Lt knee flexion this session.  STretches and manual completed to loosen tissue and promote increased ROM.  Rt Knee is actually at 90 degreees due to arthritis and she plans on getting this LE replaced in the future.  AROM achieved this session 108 and PROM  to 110.  Pt encouraged to continue with this.  Very little scar tissue or adhesions today.  Pt does have small stitch exposed in superior scar she will have MD look at tomorrow.     Personal Factors and Comorbidities Comorbidity 1    Comorbidities bilateral kne epain    Examination-Activity Limitations Bed Mobility;Bathing;Sit;Transfers;Locomotion Level;Stand;Stairs;Squat;Carry;Bend    Examination-Participation Restrictions Meal Prep;Driving;Shop;Community Activity;Cleaning    Stability/Clinical Decision Making Stable/Uncomplicated    Rehab Potential Good    PT Frequency 3x / week    PT Duration 6 weeks    PT Treatment/Interventions ADLs/Self Care Home Management;Aquatic Therapy;Cryotherapy;Electrical Stimulation;Iontophoresis 86m/ml Dexamethasone;Moist Heat;Traction;Balance training;Therapeutic exercise;Therapeutic activities;Functional mobility training;Stair training;Gait training;DME Instruction;Neuromuscular re-education;Patient/family education;Manual techniques;Dry needling;Passive range of motion;Scar mobilization    PT Next Visit Plan focus on knee flexion on left LE - goal 115-120 degrees; Pt may wish to be discharged following MD appt.    PT Home Exercise Plan heel slides, elevation; 06/17/20: Functional squats, standing knee flexion; 9/24 supine heel slide with strap    Consulted and Agree with Plan of Care Patient           Patient will benefit from skilled therapeutic intervention in order to improve the following deficits and impairments:  Abnormal gait, Decreased endurance, Decreased skin integrity, Pain, Decreased strength, Decreased activity tolerance, Difficulty walking, Decreased mobility, Decreased balance, Decreased range of motion, Decreased safety awareness, Decreased knowledge of use of DME, Increased edema  Visit Diagnosis: Decreased range of motion (ROM) of left knee  Difficulty in walking, not elsewhere classified  Chronic pain of left knee     Problem List Patient Active Problem List   Diagnosis Date Noted  . Osteoarthritis of left knee 05/18/2020  . Anemia   . Lower abdominal pain   . Diverticulitis 08/28/2017  . Pain in the chest    . Chest pain 09/03/2014  . HTN (hypertension) 09/03/2014  . Hyperlipidemia 09/03/2014  . Pain in joint, shoulder region 07/10/2012  . 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  . Osteoarthrosis, unspecified whether generalized or localized, lower leg 04/21/2008  . KNEE PAIN 04/21/2008  . HIGH BLOOD PRESSURE 04/20/2008   ATeena Irani PTA/CLT [phone] FTeena Irani9/27/2021, 1:21 PM  [place] [address]Phone: [phone]  Fax:  [phone] Name: Monique MONCRIEFFEMRN: [id number] of Birth: [birth year]

## 2020-06-28 ENCOUNTER — Telehealth (HOSPITAL_COMMUNITY): Payer: Self-pay | Admitting: Physical Therapy

## 2020-06-28 NOTE — Telephone Encounter (Signed)
pt wants to be discharged per md orders. She called to cancel all of her appts.

## 2020-07-04 ENCOUNTER — Encounter (HOSPITAL_COMMUNITY): Payer: Self-pay | Admitting: Physical Therapy

## 2020-07-06 ENCOUNTER — Encounter (HOSPITAL_COMMUNITY): Payer: Self-pay | Admitting: Physical Therapy

## 2020-07-07 ENCOUNTER — Ambulatory Visit (HOSPITAL_COMMUNITY)
Admission: RE | Admit: 2020-07-07 | Discharge: 2020-07-07 | Disposition: A | Discharge status: Home / Self Care | Payer: Medicare Other | Source: Ambulatory Visit | Attending: Family Medicine | Admitting: Family Medicine

## 2020-07-07 ENCOUNTER — Other Ambulatory Visit: Payer: Self-pay

## 2020-07-07 DIAGNOSIS — Z1231 Encounter for screening mammogram for malignant neoplasm of breast: Secondary | ICD-10-CM | POA: Insufficient documentation

## 2020-12-04 ENCOUNTER — Ambulatory Visit
Admission: EM | Admit: 2020-12-04 | Discharge: 2020-12-04 | Disposition: A | Discharge status: Home / Self Care | Payer: Medicare Other | Attending: Family Medicine | Admitting: Family Medicine

## 2020-12-04 ENCOUNTER — Other Ambulatory Visit: Payer: Self-pay

## 2020-12-04 ENCOUNTER — Encounter: Payer: Self-pay | Admitting: Emergency Medicine

## 2020-12-04 DIAGNOSIS — R350 Frequency of micturition: Secondary | ICD-10-CM

## 2020-12-04 DIAGNOSIS — N3001 Acute cystitis with hematuria: Secondary | ICD-10-CM

## 2020-12-04 DIAGNOSIS — N760 Acute vaginitis: Secondary | ICD-10-CM

## 2020-12-04 LAB — POCT URINALYSIS DIP (MANUAL ENTRY)
Bilirubin, UA: NEGATIVE
Glucose, UA: NEGATIVE mg/dL
Ketones, POC UA: NEGATIVE mg/dL
Leukocytes, UA: NEGATIVE
Nitrite, UA: NEGATIVE
Protein Ur, POC: 100 mg/dL — AB
Spec Grav, UA: 1.02 (ref 1.010–1.025)
Urobilinogen, UA: 0.2 E.U./dL
pH, UA: 6.5 (ref 5.0–8.0)

## 2020-12-04 MED ORDER — CEPHALEXIN 500 MG PO CAPS: 500.0000 mg | ORAL_CAPSULE | Freq: Two times a day (BID) | ORAL | 0 refills | Status: AC

## 2020-12-04 MED ORDER — FLUCONAZOLE 150 MG PO TABS: 150.0000 mg | ORAL_TABLET | Freq: Once | ORAL | 0 refills | Status: AC

## 2020-12-04 NOTE — ED Triage Notes (Signed)
States she was on antibiotics for diverticulitis last week and every time she takes antibiotics, she gets a yeast infection.  States when she wipes, she she a pink color on the toilet paper.  Hurts to urinate and is having urinary frequency.

## 2020-12-04 NOTE — ED Provider Notes (Signed)
[facility]    CSN: [id number] Arrival date & time: 12/04/20  0900      History   Chief Complaint No chief complaint on file.   HPI Monique Wagner is a 80 y.o. female.   HPI  Vaginitis ongoing for several days.  Patient recently treated with 2 antibiotics for diverticulitis and subsequently developed vaginal irritation and itching.  Last night patient reports acute onset of urine frequency which caused her to use the restroom multiple times throughout the night which further progress to burning with urination.  Patient denies any history of recurrent UTIs.  She reports noticing a small amount of pink which she suspect it was blood when wiping after urination during the night.  She is afebrile, denies N&V or back pain. Past Medical History:  Diagnosis Date  . Arthritis   . Diverticulitis   . Dyspnea   . HTN (hypertension)   . Hyperlipidemia   . Stroke (HCC)    slight stroke 50 years ago - no deficits     Patient Active Problem List   Diagnosis Date Noted  . Osteoarthritis of left knee 05/18/2020  . Anemia   . Lower abdominal pain   . Diverticulitis 08/28/2017  . Pain in the chest   . Chest pain 09/03/2014  . HTN (hypertension) 09/03/2014  . Hyperlipidemia 09/03/2014  . Pain in joint, shoulder region 07/10/2012  . 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  . Osteoarthrosis, unspecified whether generalized or localized, lower leg 04/21/2008  . KNEE PAIN 04/21/2008  . HIGH BLOOD PRESSURE 04/20/2008    Past Surgical History:  Procedure Laterality Date  . ABDOMINAL HYSTERECTOMY    . BREAST SURGERY    . KNEE ARTHROPLASTY Left 05/18/2020   Procedure: COMPUTER ASSISTED TOTAL KNEE ARTHROPLASTY;  Surgeon: Samson Frederic, MD;  Location: [facility];  Service: Orthopedics;  Laterality: Left;    OB History   No obstetric history on file.      Home Medications    Prior to Admission medications   Medication Sig Start Date End Date Taking? Authorizing Provider   cephALEXin (KEFLEX) 500 MG capsule Take 1 capsule (500 mg total) by mouth 2 (two) times daily for 5 days. 12/04/20 12/09/20 Yes Bing Neighbors, FNP  fluconazole (DIFLUCAN) 150 MG tablet Take 1 tablet (150 mg total) by mouth once for 1 dose. Repeat in 3 days 12/04/20 12/04/20 Yes Bing Neighbors, FNP  cholecalciferol (VITAMIN D3) 25 MCG (1000 UNIT) tablet Take 1,000 Units by mouth daily.    [provider]  DANDELION PO Take 1 capsule by mouth daily.    [provider]  docusate sodium (COLACE) 100 MG capsule Take 1 capsule (100 mg total) by mouth 2 (two) times daily. 05/19/20   Barrie Dunker B, PA  dorzolamide-timolol (COSOPT) 22.3-6.8 MG/ML ophthalmic solution Place 1 drop into both eyes 2 (two) times daily.    [provider]  ezetimibe (ZETIA) 10 MG tablet Take 10 mg by mouth daily. 03/03/20   [provider]  fenofibrate (TRICOR) 48 MG tablet Take 48 mg by mouth daily. 03/09/20   [provider]  Milk Thistle 150 MG CAPS Take 150 mg by mouth daily.    [provider]  Multiple Vitamins-Minerals (MULTIVITAMIN PO) Take 1 tablet by mouth daily.     [provider]  Netarsudil-Latanoprost (ROCKLATAN) 0.02-0.005 % SOLN Place 1 drop into both eyes daily.    [provider]  Omega-3 Fatty Acids (FISH OIL) 1000 MG CAPS Take  1,000 mg by mouth daily.    [provider]  ondansetron (ZOFRAN) 4 MG tablet Take 1 tablet (4 mg total) by mouth every 6 (six) hours as needed for nausea. 05/19/20   Darrick Grinder, PA  predniSONE (DELTASONE) 10 MG tablet Take 10 mg by mouth daily as needed (for knee/hand pain).     [provider]  Probiotic CAPS Take 1 capsule by mouth 2 (two) times daily.    [provider]  senna (SENOKOT) 8.6 MG TABS tablet Take 1 tablet (8.6 mg total) by mouth 2 (two) times daily. 05/19/20   Darrick Grinder, PA  telmisartan-hydrochlorothiazide (MICARDIS HCT) 40-12.5 MG tablet Take 1 tablet by  mouth daily.     [provider]  Turmeric 450 MG CAPS Take 450 mg by mouth daily.    [provider]    Family History Family History  Problem Relation Age of Onset  . Arthritis Other   . Cancer Other   . Diabetes Other   . Heart disease Other     Social History Social History   Tobacco Use  . Smoking status: Never Smoker  . Smokeless tobacco: Never Used  Vaping Use  . Vaping Use: Never used  Substance Use Topics  . Alcohol use: No  . Drug use: No     Allergies   Patient has no known allergies.   Review of Systems Review of Systems Pertinent negatives listed in HPI Physical Exam Triage Vital Signs ED Triage Vitals  Enc Vitals Group     BP 12/04/20 0955 139/68     Pulse Rate 12/04/20 0955 66     Resp 12/04/20 0955 16     Temp 12/04/20 0955 99.4 F (37.4 C)     Temp Source 12/04/20 0955 Oral     SpO2 12/04/20 0955 97 %     Weight --      Height --      Head Circumference --      Peak Flow --      Pain Score 12/04/20 1004 6     Pain Loc --      Pain Edu? --      Excl. in GC? --    No data found.  Updated Vital Signs BP 139/68 (BP Location: Right Arm)   Pulse 66   Temp 99.4 F (37.4 C) (Oral)   Resp 16   SpO2 97%   Visual Acuity Right Eye Distance:   Left Eye Distance:   Bilateral Distance:    Right Eye Near:   Left Eye Near:    Bilateral Near:     Physical Exam General appearance: Alert, well developed, well nourished, cooperative Head: Normocephalic, without obvious abnormality, atraumatic Respiratory: Respirations even and unlabored, normal respiratory rate Heart: rate and rhythm normal. No gallop or murmurs noted on exam  Abdomen: BS +, no distention, no rebound tenderness, no CVA tenderness  Extremities: No gross deformities Skin: Skin color, texture, turgor normal. No rashes seen  Psych: Appropriate mood and affect. Neurologic: GCS 15, normal coordination, normal gait UC Treatments / Results  Labs (all labs  ordered are listed, but only abnormal results are displayed) Labs Reviewed  POCT URINALYSIS DIP (MANUAL ENTRY) - Abnormal; Notable for the following components:      Result Value   Blood, UA moderate (*)    Protein Ur, POC =100 (*)    All other components within normal limits  URINE CULTURE    EKG   Radiology  No results found.  Procedures Procedures (including critical care time)  Medications Ordered in UC Medications - No data to display  Initial Impression / Assessment and Plan / UC Course  I have reviewed the triage vital signs and the nursing notes.  Pertinent labs & imaging results that were available during my care of the patient were reviewed by me and considered in my medical decision making (see chart for details).    UA significant for moderate blood and protein however given patient's symptoms will cover for acute cystitis.  Urine culture pending.  Keflex 500 mg twice daily for 5 days.  Vaginitis related to recent antibacterial use covering with Diflucan 1 tablet today repeat in 3 days.  Red flag discussed.  ER precautions given.  Follow-up with PCP as needed. Final Clinical Impressions(s) / UC Diagnoses   Final diagnoses:  Urinary frequency  Vaginitis and vulvovaginitis  Acute cystitis with hematuria   Discharge Instructions   None    ED Prescriptions    Medication Sig Dispense Auth. Provider   fluconazole (DIFLUCAN) 150 MG tablet Take 1 tablet (150 mg total) by mouth once for 1 dose. Repeat in 3 days 2 tablet Bing Neighbors, FNP   cephALEXin (KEFLEX) 500 MG capsule Take 1 capsule (500 mg total) by mouth 2 (two) times daily for 5 days. 10 capsule Bing Neighbors, FNP     PDMP not reviewed this encounter.   Bing Neighbors, [place] 12/05/20 [phone]

## 2020-12-05 LAB — URINE CULTURE
Culture: NO GROWTH
Special Requests: NORMAL

## 2020-12-08 ENCOUNTER — Other Ambulatory Visit (HOSPITAL_COMMUNITY)
Admission: RE | Admit: 2020-12-08 | Discharge: 2020-12-08 | Disposition: A | Discharge status: Home / Self Care | Payer: Medicare Other | Source: Ambulatory Visit | Attending: Nurse Practitioner | Admitting: Nurse Practitioner

## 2020-12-08 DIAGNOSIS — N898 Other specified noninflammatory disorders of vagina: Secondary | ICD-10-CM | POA: Diagnosis present

## 2020-12-09 LAB — MOLECULAR ANCILLARY ONLY
Bacterial Vaginitis (gardnerella): NEGATIVE
Candida Glabrata: NEGATIVE
Candida Vaginitis: NEGATIVE
Chlamydia: NEGATIVE
Comment: NEGATIVE
Comment: NEGATIVE
Comment: NEGATIVE
Comment: NEGATIVE
Comment: NEGATIVE
Comment: NORMAL
Neisseria Gonorrhea: NEGATIVE
Trichomonas: NEGATIVE

## 2020-12-14 DIAGNOSIS — N390 Urinary tract infection, site not specified: Secondary | ICD-10-CM

## 2020-12-14 HISTORY — DX: Urinary tract infection, site not specified: N39.0

## 2021-01-10 ENCOUNTER — Other Ambulatory Visit: Payer: Self-pay | Admitting: Nurse Practitioner

## 2021-01-10 ENCOUNTER — Other Ambulatory Visit (HOSPITAL_COMMUNITY)
Admission: RE | Admit: 2021-01-10 | Discharge: 2021-01-10 | Disposition: A | Discharge status: Home / Self Care | Payer: Medicare Other | Source: Ambulatory Visit | Attending: Nurse Practitioner | Admitting: Nurse Practitioner

## 2021-01-10 DIAGNOSIS — N898 Other specified noninflammatory disorders of vagina: Secondary | ICD-10-CM | POA: Diagnosis present

## 2021-01-11 LAB — MOLECULAR ANCILLARY ONLY
Bacterial Vaginitis (gardnerella): NEGATIVE
Candida Glabrata: NEGATIVE
Candida Vaginitis: NEGATIVE
Chlamydia: NEGATIVE
Comment: NEGATIVE
Comment: NEGATIVE
Comment: NEGATIVE
Comment: NEGATIVE
Comment: NEGATIVE
Comment: NORMAL
Neisseria Gonorrhea: NEGATIVE
Trichomonas: POSITIVE — AB

## 2021-02-03 ENCOUNTER — Other Ambulatory Visit (HOSPITAL_COMMUNITY)
Admission: RE | Admit: 2021-02-03 | Discharge: 2021-02-03 | Disposition: A | Discharge status: Home / Self Care | Payer: Medicare Other | Source: Ambulatory Visit | Attending: Obstetrics & Gynecology | Admitting: Obstetrics & Gynecology

## 2021-02-03 ENCOUNTER — Other Ambulatory Visit: Payer: Self-pay

## 2021-02-03 ENCOUNTER — Ambulatory Visit: Payer: Medicare Other | Admitting: Obstetrics & Gynecology

## 2021-02-03 ENCOUNTER — Encounter: Payer: Self-pay | Admitting: Obstetrics & Gynecology

## 2021-02-03 VITALS — BP 143/61 | HR 51 | Ht 64.0 in | Wt 160.6 lb

## 2021-02-03 DIAGNOSIS — A599 Trichomoniasis, unspecified: Secondary | ICD-10-CM | POA: Diagnosis present

## 2021-02-03 DIAGNOSIS — N3281 Overactive bladder: Secondary | ICD-10-CM

## 2021-02-03 MED ORDER — MIRABEGRON ER 25 MG PO TB24: 25.0000 mg | ORAL_TABLET | Freq: Every day | ORAL | 11 refills | Status: DC

## 2021-02-03 NOTE — Progress Notes (Signed)
   GYN VISIT Patient name: Monique Wagner MRN [id number]  Date of birth: 18-Sep-1941 Chief Complaint:   Urinary Tract Infection  History of Present Illness:   Monique Wagner is a 80 y.o. G1P1001 postmenopausal posthyst female being seen today for urinary concerns:  -Urinary issues: Symptoms have been going on for the past 3 mos or so.  She notes dysuria and burning. She also reports urinary frequency and urgency.  On occasion will have urge incontinence with a full bladder. -Given gemtesa, which has helped, but due to cost unable to pick up.  -Vaginal discharge: treated for Trich- discharge had gone away, but again noted intermittent discharge this past Friday.  No vaginal odor, no itching. Records reviewed last UCx 11/2020- negative.     No LMP recorded. Patient has had a hysterectomy.  No flowsheet data found.   Review of Systems:   Pertinent items are noted in HPI Denies fever/chills, dizziness, headaches, visual disturbances, fatigue, shortness of breath, chest pain, abdominal pain, vomiting, bowel movements, urination, or intercourse unless otherwise stated above.  Pertinent History Reviewed:  Reviewed past medical,surgical, social, obstetrical and family history.  Reviewed problem list, medications and allergies. Physical Assessment:   Vitals:   02/03/21 1114  BP: (!) 143/61  Pulse: (!) 51  Weight: 160 lb 9.6 oz (72.8 kg)  Height: 5\' 4"  (1.626 m)  Body mass index is 27.57 kg/m.       Physical Examination:   General appearance: alert, well appearing, and in no distress  Psych: mood appropriate, normal affect  Skin: warm & dry   Cardiovascular: normal heart rate noted  Respiratory: normal respiratory effort, no distress  Abdomen: soft, non-tender   Pelvic: normal external genitalia, vulva, vagina, cervix, uterus and adnexa  Extremities: no edema   Chaperone:    Assessment & Plan:  1) Overactive bladder -plan to send in myrbetriq, if not covered, pt to  call back -discussed possibility having to trial anti-cholinergic first- reviewed potential side effects, plan for med follow up in 3-66mos -also discussed conservative therapy, plan to stop coffee daily  2) Vaginal discharge -will r/o underlying infection  No orders of the defined types were placed in this encounter.   Return in about 3 months (around 05/06/2021) for Medication follow up, with Dr. 07/06/2021, please print AVS.   Charlotta Newton, DO Attending Obstetrician & Gynecologist, [facility], [facility]

## 2021-02-03 NOTE — Patient Instructions (Signed)
I will send in Myrbetriq- take one tablet daily.  If too expensive, please call me and I will send in a different prescription.  Start Myrbetriq AFTER you finish the other free samples.

## 2021-02-07 LAB — CERVICOVAGINAL ANCILLARY ONLY
Bacterial Vaginitis (gardnerella): NEGATIVE
Candida Glabrata: POSITIVE — AB
Candida Vaginitis: NEGATIVE
Comment: NEGATIVE
Comment: NEGATIVE
Comment: NEGATIVE
Comment: NEGATIVE
Trichomonas: NEGATIVE

## 2021-02-08 ENCOUNTER — Other Ambulatory Visit: Payer: Self-pay | Admitting: Obstetrics & Gynecology

## 2021-02-08 MED ORDER — BORIC ACID VAGINAL 600 MG VA SUPP: 600.0000 mg | Freq: Every evening | VAGINAL | 1 refills | Status: AC

## 2021-02-08 NOTE — Progress Notes (Signed)
Tried calling/left message regarding new Rx for boric acid.  Pt advised to call the office if further questions

## 2021-02-09 ENCOUNTER — Telehealth: Payer: Self-pay | Admitting: Adult Health

## 2021-02-09 ENCOUNTER — Other Ambulatory Visit: Payer: Self-pay

## 2021-02-09 NOTE — Telephone Encounter (Signed)
Called pt to clarify what she meant by bladder medication. She explained that Boric acid was called in, but the pharmacy claimed they didn't have it. She stated that she was in the hallway just outside the office. I told her I would call the pharmacy and contact her with more information.  Called pharmacy. They stated that they never received any prescription. I asked if they would take a verbal order. Gave order per Dr. Lawana Chambers order.  Went to tell pt in person that the medication had been called in and they were working on it now. She stated she would go over there now and wait for it.

## 2021-02-09 NOTE — Telephone Encounter (Signed)
Patient called stating that she would like Victorino Dike to call her in a medication for her Bladder, patient states she uses [facility]. Please contact pt

## 2021-02-16 ENCOUNTER — Other Ambulatory Visit (HOSPITAL_COMMUNITY)
Admission: RE | Admit: 2021-02-16 | Discharge: 2021-02-16 | Disposition: A | Discharge status: Home / Self Care | Payer: Medicare Other | Source: Ambulatory Visit | Attending: Nurse Practitioner | Admitting: Nurse Practitioner

## 2021-02-16 ENCOUNTER — Other Ambulatory Visit: Payer: Self-pay | Admitting: Nurse Practitioner

## 2021-02-16 DIAGNOSIS — N898 Other specified noninflammatory disorders of vagina: Secondary | ICD-10-CM | POA: Insufficient documentation

## 2021-02-20 LAB — MOLECULAR ANCILLARY ONLY
Bacterial Vaginitis (gardnerella): NEGATIVE
Candida Glabrata: NEGATIVE
Candida Vaginitis: NEGATIVE
Chlamydia: NEGATIVE
Comment: NEGATIVE
Comment: NEGATIVE
Comment: NEGATIVE
Comment: NEGATIVE
Comment: NEGATIVE
Comment: NORMAL
Neisseria Gonorrhea: NEGATIVE
Trichomonas: NEGATIVE

## 2021-02-28 ENCOUNTER — Telehealth: Payer: Self-pay

## 2021-02-28 ENCOUNTER — Telehealth: Payer: Self-pay | Admitting: Obstetrics & Gynecology

## 2021-02-28 NOTE — Telephone Encounter (Signed)
Patient called stating that she would like to know if Dr. Charlotta Newton would lie for her to take her last pill, patient states she is feeling better. Please contact pt

## 2021-02-28 NOTE — Telephone Encounter (Signed)
Called pt for clarification on the medication she was questioning. She stated that she was taking boric acid and was almost done with them, but she got another yeast infection. Her PCP prescribed a treatment for that. Informed pt that Dr Charlotta Newton was not in the office today, but would be here tomorrow and would f/u with her about her concerns. Pt confirmed understanding.

## 2021-03-01 NOTE — Telephone Encounter (Signed)
Patient states she was having vaginal irritation so she called her PCP in which they prescribed her Diflucan.  She took one then repeated 7 days later. Seems to be feeling a little better. Advised since she just took one today, to give another day or two to see if symptoms completely resolve.  Also advised to continue Myrbetriq.  Pt verbalized understanding.

## 2021-03-23 ENCOUNTER — Encounter: Payer: Self-pay | Admitting: Adult Health

## 2021-03-23 ENCOUNTER — Other Ambulatory Visit (HOSPITAL_COMMUNITY)
Admission: RE | Admit: 2021-03-23 | Discharge: 2021-03-23 | Disposition: A | Discharge status: Home / Self Care | Payer: Medicare Other | Source: Ambulatory Visit | Attending: Adult Health | Admitting: Adult Health

## 2021-03-23 ENCOUNTER — Other Ambulatory Visit: Payer: Self-pay

## 2021-03-23 ENCOUNTER — Ambulatory Visit: Payer: Medicare Other | Admitting: Adult Health

## 2021-03-23 VITALS — BP 139/77 | HR 74 | Ht 64.0 in | Wt 157.0 lb

## 2021-03-23 DIAGNOSIS — B373 Candidiasis of vulva and vagina: Secondary | ICD-10-CM | POA: Diagnosis present

## 2021-03-23 DIAGNOSIS — N898 Other specified noninflammatory disorders of vagina: Secondary | ICD-10-CM | POA: Diagnosis not present

## 2021-03-23 DIAGNOSIS — B3731 Acute candidiasis of vulva and vagina: Secondary | ICD-10-CM

## 2021-03-23 NOTE — Progress Notes (Signed)
  Subjective:     Patient ID: Monique Wagner, female   DOB: [birth year], 80 y.o.   MRN: [id number]  HPI Raul is a 80 year old black female, widowed, PM so hysterectomy in complaining of yeast infection, has discharge. She used boric acid supp. In May whn CV swab back back with candida glabrata. PCP is Dr Loleta Chance  Review of Systems +vaginal discharge, ?recurrent yeast She is not sexually active. Reviewed past medical,surgical, social and family history. Reviewed medications and allergies.     Objective:   Physical Exam BP 139/77 (BP Location: Right Arm, Patient Position: Sitting, Cuff Size: Normal)   Pulse 74   Ht 5\' 4"  (1.626 m)   Wt 157 lb (71.2 kg)   BMI 26.95 kg/m  Skin warm and dry.Pelvic: external genitalia is normal in appearance no lesions, vagina: tan discharge without odor,vaginal tissues dry,urethra has no lesions or masses noted, cervix and uterus are absent,adnexa: no masses or tenderness noted. Bladder is non tender and no masses felt. CV swab obtained.     Upstream - 03/23/21 1412       Pregnancy Intention Screening   Does the patient want to become pregnant in the next year? No    Does the patient's partner want to become pregnant in the next year? No    Would the patient like to discuss contraceptive options today? No      Contraception Wrap Up   Current Method Female Sterilization   hyst   End Method Female Sterilization    Contraception Counseling Provided No            Examination chaperoned by 03/25/21 Assessment:     1. Yeast vaginitis   2. Vaginal discharge CV swab sent for BV and yeast     Plan:     Will talk on Monday, on results and if meds needed

## 2021-03-27 LAB — CERVICOVAGINAL ANCILLARY ONLY
Bacterial Vaginitis (gardnerella): NEGATIVE
Candida Glabrata: NEGATIVE
Candida Vaginitis: NEGATIVE
Comment: NEGATIVE
Comment: NEGATIVE
Comment: NEGATIVE

## 2021-04-04 ENCOUNTER — Ambulatory Visit: Payer: Self-pay | Admitting: Student

## 2021-04-10 ENCOUNTER — Ambulatory Visit: Payer: Self-pay | Admitting: Student

## 2021-04-10 NOTE — H&P (View-Only) (Signed)
TOTAL KNEE ADMISSION H&P  Patient is being admitted for right total knee arthroplasty.  Subjective:  Chief Complaint:right knee pain.  HPI: Monique Wagner, 80 y.o. female, has a history of pain and functional disability in the right knee due to arthritis and has failed non-surgical conservative treatments for greater than 12 weeks to includeNSAID's and/or analgesics and activity modification.  Onset of symptoms was gradual, starting 3 years ago with gradually worsening course since that time. The patient noted no past surgery on the right knee(s).  Patient currently rates pain in the right knee(s) at 8 out of 10 with activity. Patient has worsening of pain with activity and weight bearing, pain that interferes with activities of daily living, pain with passive range of motion, and crepitus.  Patient has evidence of subchondral cysts, subchondral sclerosis, and joint space narrowing by imaging studies. There is no active infection.  Patient Active Problem List   Diagnosis Date Noted   Yeast vaginitis 03/23/2021   Vaginal discharge 03/23/2021   Osteoarthritis of left knee 05/18/2020   Anemia    Lower abdominal pain    Diverticulitis 08/28/2017   Pain in the chest    Chest pain 09/03/2014   HTN (hypertension) 09/03/2014   Hyperlipidemia 09/03/2014   Pain in joint, shoulder region 07/10/2012   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   Osteoarthrosis, unspecified whether generalized or localized, lower leg 04/21/2008   KNEE PAIN 04/21/2008   HIGH BLOOD PRESSURE 04/20/2008   Past Medical History:  Diagnosis Date   Arthritis    Diverticulitis    Dyspnea    HTN (hypertension)    Hyperlipidemia    Stroke (HCC)    slight stroke 50 years ago - no deficits     Past Surgical History:  Procedure Laterality Date   ABDOMINAL HYSTERECTOMY     BREAST SURGERY     KNEE ARTHROPLASTY Left 05/18/2020   Procedure: COMPUTER ASSISTED TOTAL KNEE ARTHROPLASTY;  Surgeon: Samson Frederic, MD;  Location:  [facility];  Service: Orthopedics;  Laterality: Left;    Current Outpatient Medications  Medication Sig Dispense Refill Last Dose   cholecalciferol (VITAMIN D3) 25 MCG (1000 UNIT) tablet Take 1,000 Units by mouth daily.      DANDELION PO Take 1 capsule by mouth daily.      dorzolamide-timolol (COSOPT) 22.3-6.8 MG/ML ophthalmic solution Place 1 drop into both eyes 2 (two) times daily.      ezetimibe (ZETIA) 10 MG tablet Take 10 mg by mouth daily.      fenofibrate (TRICOR) 48 MG tablet Take 48 mg by mouth daily.      Flaxseed, Linseed, (FLAXSEED OIL PO) Take by mouth.      Milk Thistle 150 MG CAPS Take 150 mg by mouth daily.      mirabegron ER (MYRBETRIQ) 25 MG TB24 tablet Take 1 tablet (25 mg total) by mouth daily. 30 tablet 11    Multiple Vitamins-Minerals (MULTIVITAMIN PO) Take 1 tablet by mouth daily.       Netarsudil-Latanoprost (ROCKLATAN) 0.02-0.005 % SOLN Place 1 drop into both eyes daily.      Omega-3 Fatty Acids (FISH OIL) 1000 MG CAPS Take 1,000 mg by mouth daily.      predniSONE (DELTASONE) 10 MG tablet Take 10 mg by mouth daily as needed (for knee/hand pain).      Probiotic CAPS Take 1 capsule by mouth 2 (two) times daily.      telmisartan-hydrochlorothiazide (MICARDIS HCT) 40-12.5 MG tablet Take 1 tablet by mouth daily.  Turmeric 450 MG CAPS Take 450 mg by mouth daily.      No current facility-administered medications for this visit.   Allergies  Allergen Reactions   Tape     Social History   Tobacco Use   Smoking status: Never   Smokeless tobacco: Never  Substance Use Topics   Alcohol use: No    Family History  Problem Relation Age of Onset   Arthritis Other    Cancer Other    Diabetes Other    Heart disease Other      Review of Systems  Musculoskeletal:  Positive for arthralgias.  All other systems reviewed and are negative.  Objective:  Physical Exam Constitutional:      Appearance: She is normal weight.  HENT:     Head: Normocephalic.  Eyes:      Pupils: Pupils are equal, round, and reactive to light.  Cardiovascular:     Rate and Rhythm: Normal rate and regular rhythm.     Pulses: Normal pulses.  Pulmonary:     Effort: Pulmonary effort is normal.  Abdominal:     Palpations: Abdomen is soft.     Tenderness: There is no abdominal tenderness.  Genitourinary:    Comments: Deferred Musculoskeletal:        General: Tenderness present.     Cervical back: Normal range of motion.  Skin:    General: Skin is warm and dry.  Neurological:     Mental Status: She is alert and oriented to person, place, and time.  Psychiatric:        Mood and Affect: Mood normal.    Vital signs in last 24 hours: @VSRANGES@  Labs:   Estimated body mass index is 26.95 kg/m as calculated from the following:   Height as of 03/23/21: 5' 4" (1.626 m).   Weight as of 03/23/21: 71.2 kg.   Imaging Review Plain radiographs demonstrate severe degenerative joint disease of the right knee(s). The bone quality appears to be adequate for age and reported activity level.      Assessment/Plan:  End stage arthritis, right knee   The patient history, physical examination, clinical judgment of the provider and imaging studies are consistent with end stage degenerative joint disease of the right knee(s) and total knee arthroplasty is deemed medically necessary. The treatment options including medical management, injection therapy arthroscopy and arthroplasty were discussed at length. The risks and benefits of total knee arthroplasty were presented and reviewed. The risks due to aseptic loosening, infection, stiffness, patella tracking problems, thromboembolic complications and other imponderables were discussed. The patient acknowledged the explanation, agreed to proceed with the plan and consent was signed. Patient is being admitted for inpatient treatment for surgery, pain control, PT, OT, prophylactic antibiotics, VTE prophylaxis, progressive ambulation and ADL's  and discharge planning. The patient is planning to be discharged  home after overnight observation. She will complete outpatient rehabilitation at  [facility]     Patient's anticipated LOS is less than 2 midnights, meeting these requirements: - Lives within 1 hour of care - Has a competent adult at home to recover with post-op recover - NO history of  - Chronic pain requiring opiods  - Diabetes  - Coronary Artery Disease  - Heart failure  - Heart attack  - Stroke  - DVT/VTE  - Cardiac arrhythmia  - Respiratory Failure/COPD  - Renal failure  - Anemia  - Advanced Liver disease   

## 2021-04-10 NOTE — H&P (Signed)
TOTAL KNEE ADMISSION H&P  Patient is being admitted for right total knee arthroplasty.  Subjective:  Chief Complaint:right knee pain.  HPI: Monique Wagner, 80 y.o. female, has a history of pain and functional disability in the right knee due to arthritis and has failed non-surgical conservative treatments for greater than 12 weeks to includeNSAID's and/or analgesics and activity modification.  Onset of symptoms was gradual, starting 3 years ago with gradually worsening course since that time. The patient noted no past surgery on the right knee(s).  Patient currently rates pain in the right knee(s) at 8 out of 10 with activity. Patient has worsening of pain with activity and weight bearing, pain that interferes with activities of daily living, pain with passive range of motion, and crepitus.  Patient has evidence of subchondral cysts, subchondral sclerosis, and joint space narrowing by imaging studies. There is no active infection.  Patient Active Problem List   Diagnosis Date Noted   Yeast vaginitis 03/23/2021   Vaginal discharge 03/23/2021   Osteoarthritis of left knee 05/18/2020   Anemia    Lower abdominal pain    Diverticulitis 08/28/2017   Pain in the chest    Chest pain 09/03/2014   HTN (hypertension) 09/03/2014   Hyperlipidemia 09/03/2014   Pain in joint, shoulder region 07/10/2012   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   Osteoarthrosis, unspecified whether generalized or localized, lower leg 04/21/2008   KNEE PAIN 04/21/2008   HIGH BLOOD PRESSURE 04/20/2008   Past Medical History:  Diagnosis Date   Arthritis    Diverticulitis    Dyspnea    HTN (hypertension)    Hyperlipidemia    Stroke (HCC)    slight stroke 50 years ago - no deficits     Past Surgical History:  Procedure Laterality Date   ABDOMINAL HYSTERECTOMY     BREAST SURGERY     KNEE ARTHROPLASTY Left 05/18/2020   Procedure: COMPUTER ASSISTED TOTAL KNEE ARTHROPLASTY;  Surgeon: Samson Frederic, MD;  Location:  [facility];  Service: Orthopedics;  Laterality: Left;    Current Outpatient Medications  Medication Sig Dispense Refill Last Dose   cholecalciferol (VITAMIN D3) 25 MCG (1000 UNIT) tablet Take 1,000 Units by mouth daily.      DANDELION PO Take 1 capsule by mouth daily.      dorzolamide-timolol (COSOPT) 22.3-6.8 MG/ML ophthalmic solution Place 1 drop into both eyes 2 (two) times daily.      ezetimibe (ZETIA) 10 MG tablet Take 10 mg by mouth daily.      fenofibrate (TRICOR) 48 MG tablet Take 48 mg by mouth daily.      Flaxseed, Linseed, (FLAXSEED OIL PO) Take by mouth.      Milk Thistle 150 MG CAPS Take 150 mg by mouth daily.      mirabegron ER (MYRBETRIQ) 25 MG TB24 tablet Take 1 tablet (25 mg total) by mouth daily. 30 tablet 11    Multiple Vitamins-Minerals (MULTIVITAMIN PO) Take 1 tablet by mouth daily.       Netarsudil-Latanoprost (ROCKLATAN) 0.02-0.005 % SOLN Place 1 drop into both eyes daily.      Omega-3 Fatty Acids (FISH OIL) 1000 MG CAPS Take 1,000 mg by mouth daily.      predniSONE (DELTASONE) 10 MG tablet Take 10 mg by mouth daily as needed (for knee/hand pain).      Probiotic CAPS Take 1 capsule by mouth 2 (two) times daily.      telmisartan-hydrochlorothiazide (MICARDIS HCT) 40-12.5 MG tablet Take 1 tablet by mouth daily.  Turmeric 450 MG CAPS Take 450 mg by mouth daily.      No current facility-administered medications for this visit.   Allergies  Allergen Reactions   Tape     Social History   Tobacco Use   Smoking status: Never   Smokeless tobacco: Never  Substance Use Topics   Alcohol use: No    Family History  Problem Relation Age of Onset   Arthritis Other    Cancer Other    Diabetes Other    Heart disease Other      Review of Systems  Musculoskeletal:  Positive for arthralgias.  All other systems reviewed and are negative.  Objective:  Physical Exam Constitutional:      Appearance: She is normal weight.  HENT:     Head: Normocephalic.  Eyes:      Pupils: Pupils are equal, round, and reactive to light.  Cardiovascular:     Rate and Rhythm: Normal rate and regular rhythm.     Pulses: Normal pulses.  Pulmonary:     Effort: Pulmonary effort is normal.  Abdominal:     Palpations: Abdomen is soft.     Tenderness: There is no abdominal tenderness.  Genitourinary:    Comments: Deferred Musculoskeletal:        General: Tenderness present.     Cervical back: Normal range of motion.  Skin:    General: Skin is warm and dry.  Neurological:     Mental Status: She is alert and oriented to person, place, and time.  Psychiatric:        Mood and Affect: Mood normal.    Vital signs in last 24 hours: @VSRANGES @  Labs:   Estimated body mass index is 26.95 kg/m as calculated from the following:   Height as of 03/23/21: 5\' 4"  (1.626 m).   Weight as of 03/23/21: 71.2 kg.   Imaging Review Plain radiographs demonstrate severe degenerative joint disease of the right knee(s). The bone quality appears to be adequate for age and reported activity level.      Assessment/Plan:  End stage arthritis, right knee   The patient history, physical examination, clinical judgment of the provider and imaging studies are consistent with end stage degenerative joint disease of the right knee(s) and total knee arthroplasty is deemed medically necessary. The treatment options including medical management, injection therapy arthroscopy and arthroplasty were discussed at length. The risks and benefits of total knee arthroplasty were presented and reviewed. The risks due to aseptic loosening, infection, stiffness, patella tracking problems, thromboembolic complications and other imponderables were discussed. The patient acknowledged the explanation, agreed to proceed with the plan and consent was signed. Patient is being admitted for inpatient treatment for surgery, pain control, PT, OT, prophylactic antibiotics, VTE prophylaxis, progressive ambulation and ADL's  and discharge planning. The patient is planning to be discharged  home after overnight observation. She will complete outpatient rehabilitation at  [facility]     Patient's anticipated LOS is less than 2 midnights, meeting these requirements: - Lives within 1 hour of care - Has a competent adult at home to recover with post-op recover - NO history of  - Chronic pain requiring opiods  - Diabetes  - Coronary Artery Disease  - Heart failure  - Heart attack  - Stroke  - DVT/VTE  - Cardiac arrhythmia  - Respiratory Failure/COPD  - Renal failure  - Anemia  - Advanced Liver disease

## 2021-04-12 ENCOUNTER — Ambulatory Visit: Payer: Medicare Other | Admitting: Obstetrics & Gynecology

## 2021-04-12 ENCOUNTER — Encounter: Payer: Self-pay | Admitting: Obstetrics & Gynecology

## 2021-04-12 ENCOUNTER — Other Ambulatory Visit: Payer: Self-pay

## 2021-04-12 ENCOUNTER — Other Ambulatory Visit (HOSPITAL_COMMUNITY)
Admission: RE | Admit: 2021-04-12 | Discharge: 2021-04-12 | Disposition: A | Discharge status: Home / Self Care | Payer: Medicare Other | Source: Ambulatory Visit | Attending: Obstetrics & Gynecology | Admitting: Obstetrics & Gynecology

## 2021-04-12 VITALS — BP 134/70 | HR 68 | Ht 64.0 in | Wt 157.8 lb

## 2021-04-12 DIAGNOSIS — N898 Other specified noninflammatory disorders of vagina: Secondary | ICD-10-CM | POA: Diagnosis present

## 2021-04-12 DIAGNOSIS — R3989 Other symptoms and signs involving the genitourinary system: Secondary | ICD-10-CM | POA: Diagnosis not present

## 2021-04-12 NOTE — Progress Notes (Signed)
   GYN VISIT Patient name: Monique Wagner MRN [id number]  Date of birth: November 06, 1940 Chief Complaint:   Vaginal Discharge  History of Present Illness:   Monique Wagner is a 80 y.o. G1P1001 PM, PH female being seen today for vaginal irritation:  Pt having replacement of right knee- required full work up/surgical clearance.    Today she notes a brown/yellow discharge and "tickling." Symptoms will seem to come and go- worse this past weekend.  Not a lot, but just enough to wear a pad.  Not sure what makes it worse or better. Prior vaginitis panel 02/03/21 showed C.glabrata- treated with boric acid. Lat seen 03/23/21 by Roseanne Reno- vaginitis panel negative.  No LMP recorded. Patient has had a hysterectomy.   Review of Systems:   Pertinent items are noted in HPI Denies fever/chills, dizziness, headaches, visual disturbances, fatigue, shortness of breath, chest pain, abdominal pain, vomiting, bowel movements, urination, or intercourse unless otherwise stated above.  Pertinent History Reviewed:  Reviewed past medical,surgical, social, obstetrical and family history.  Reviewed problem list, medications and allergies. Physical Assessment:   Vitals:   04/12/21 0838  BP: 134/70  Pulse: 68  Weight: 157 lb 12.8 oz (71.6 kg)  Height: 5\' 4"  (1.626 m)  Body mass index is 27.09 kg/m.       Physical Examination:   General appearance: alert, well appearing, and in no distress  Psych: mood appropriate, normal affect  Skin: warm & dry   Cardiovascular: normal heart rate noted  Respiratory: normal respiratory effort, no distress  Abdomen: soft, non-tender   Pelvic: VULVA: normal appearing vulva with no masses, tenderness or lesions, Vagina: minimal white discharge noted, slightly erythematous appearing tissue, minimal atrophic changes noted, normal vaginal cuff, uterus and cervix surgically absent  Extremities: no edema   Chaperone:    Assessment & Plan:  1) Vaginal irritation -will  r/o underlying infection- both urinary and vaginal -suspect symptoms may be due to vaginal atrophy- due to upcoming surgery will hold off for now.  If symptoms still persist ~ 3wks after surgery - pt to call in.  Plan to send in trial of vaginal estrogen  -Denies h/o breast or uterine cancer.  Denies h/o stroke, MI or VTE.  Denies active liver disease- no contraindications to estrogen therapy.  She has been advised that current symptoms should not postpone her upcoming surgery  Orders Placed This Encounter  Procedures   Urine Culture   Urinalysis, Routine w reflex microscopic    Return if symptoms worsen or fail to improve.   Malachy Mood, DO Attending Obstetrician & Gynecologist, [facility] for [facility]

## 2021-04-13 LAB — URINALYSIS, ROUTINE W REFLEX MICROSCOPIC
Bilirubin, UA: NEGATIVE
Glucose, UA: NEGATIVE
Ketones, UA: NEGATIVE
Nitrite, UA: POSITIVE — AB
Specific Gravity, UA: 1.02 (ref 1.005–1.030)
Urobilinogen, Ur: 0.2 mg/dL (ref 0.2–1.0)
pH, UA: 6 (ref 5.0–7.5)

## 2021-04-13 LAB — CERVICOVAGINAL ANCILLARY ONLY
Candida Glabrata: POSITIVE — AB
Candida Vaginitis: NEGATIVE
Comment: NEGATIVE
Comment: NEGATIVE

## 2021-04-13 LAB — MICROSCOPIC EXAMINATION
Casts: NONE SEEN /lpf
WBC, UA: >30 /hpf — AB (ref 0–5)

## 2021-04-14 ENCOUNTER — Other Ambulatory Visit: Payer: Self-pay | Admitting: Obstetrics & Gynecology

## 2021-04-14 ENCOUNTER — Telehealth: Payer: Self-pay | Admitting: Obstetrics & Gynecology

## 2021-04-14 DIAGNOSIS — B379 Candidiasis, unspecified: Secondary | ICD-10-CM

## 2021-04-14 MED ORDER — AZO BORIC ACID 600 MG VA SUPP: 600.0000 mg | Freq: Every evening | VAGINAL | 1 refills | Status: AC

## 2021-04-14 MED ORDER — AZO BORIC ACID 600 MG VA SUPP: 600.0000 mg | VAGINAL | 3 refills | Status: DC | PRN

## 2021-04-14 NOTE — Telephone Encounter (Signed)
Pt returned your call.  

## 2021-04-15 ENCOUNTER — Other Ambulatory Visit: Payer: Self-pay | Admitting: Obstetrics & Gynecology

## 2021-04-15 LAB — URINE CULTURE

## 2021-04-15 MED ORDER — SULFAMETHOXAZOLE-TRIMETHOPRIM 800-160 MG PO TABS: 1.0000 | ORAL_TABLET | Freq: Two times a day (BID) | ORAL | 0 refills | Status: AC

## 2021-04-15 NOTE — Progress Notes (Signed)
Called with results of recent UCx- +UTI- Rx sent in for Macrobid.  Pt to f/u if no improvement or return of her symptoms

## 2021-04-18 NOTE — Patient Instructions (Addendum)
DUE TO COVID-19 ONLY ONE VISITOR IS ALLOWED TO COME WITH YOU AND STAY IN THE WAITING ROOM ONLY DURING PRE OP AND PROCEDURE DAY OF SURGERY. THE 1 VISITOR  MAY VISIT WITH YOU AFTER SURGERY IN YOUR PRIVATE ROOM DURING VISITING HOURS ONLY!  YOU NEED TO HAVE A COVID 19 TEST ON: 04/24/21 @ 10:00 AM , THIS TEST MUST BE DONE BEFORE SURGERY,  COVID TESTING SITE [address] [place] [address], IT IS ON THE RIGHT GOING OUT [address] APPROXIMATELY  2 MINUTES PAST [employer] ON THE RIGHT. ONCE YOUR COVID TEST IS COMPLETED,  PLEASE BEGIN THE QUARANTINE INSTRUCTIONS AS OUTLINED IN YOUR HANDOUT.               Monique Wagner   Your procedure is scheduled on: 04/26/21   Report to [facility] Main  Entrance   Report to admitting at : 8:00 AM     Call this number if you have problems the morning of surgery [phone]    Remember: NO SOLID FOOD AFTER MIDNIGHT THE NIGHT PRIOR TO SURGERY. NOTHING BY MOUTH EXCEPT CLEAR LIQUIDS UNTIL: 7:30 AM . PLEASE FINISH ENSURE DRINK PER SURGEON ORDER  WHICH NEEDS TO BE COMPLETED AT : 7:30 AM.   CLEAR LIQUID DIET  Foods Allowed                                                                     Foods Excluded  Coffee and tea, regular and decaf                             liquids that you cannot  Plain Jell-O any favor except red or purple                                           see through such as: Fruit ices (not with fruit pulp)                                     milk, soups, orange juice  Iced Popsicles                                    All solid food Carbonated beverages, regular and diet                                    Cranberry, grape and apple juices Sports drinks like Gatorade Lightly seasoned clear broth or consume(fat free) Sugar, honey syrup  Sample Menu Breakfast                                Lunch  Supper Cranberry juice                    Beef broth                             Chicken broth Jell-O                                     Grape juice                           Apple juice Coffee or tea                        Jell-O                                      Popsicle                                                Coffee or tea                        Coffee or tea  _____________________________________________________________________   BRUSH YOUR TEETH MORNING OF SURGERY AND RINSE YOUR MOUTH OUT, NO CHEWING GUM CANDY OR MINTS.    Take these medicines the morning of surgery with A SIP OF WATER: mirabegron.Use eye drops as usual.                               You may not have any metal on your body including hair pins and              piercings  Do not wear jewelry, make-up, lotions, powders or perfumes, deodorant             Do not wear nail polish on your fingernails.  Do not shave  48 hours prior to surgery.    Do not bring valuables to the hospital. [place] IS NOT             RESPONSIBLE   FOR VALUABLES.  Contacts, dentures or bridgework may not be worn into surgery.  Leave suitcase in the car. After surgery it may be brought to your room.     Patients discharged the day of surgery will not be allowed to drive home. IF YOU ARE HAVING SURGERY AND GOING HOME THE SAME DAY, YOU MUST HAVE AN ADULT TO DRIVE YOU HOME AND BE WITH YOU FOR 24 HOURS. YOU MAY GO HOME BY TAXI OR UBER OR ORTHERWISE, BUT AN ADULT MUST ACCOMPANY YOU HOME AND STAY WITH YOU FOR 24 HOURS.  Name and phone number of your driver:  Special Instructions: N/A              Please read over the following fact sheets you were given: _____________________________________________________________________           [facility] - Preparing for Surgery Before surgery, you can play an important role.  Because skin is not sterile, your skin needs to be as free of  germs as possible.  You can reduce the number of germs on your skin by washing with CHG (chlorahexidine gluconate) soap before surgery.  CHG is  an antiseptic cleaner which kills germs and bonds with the skin to continue killing germs even after washing. Please DO NOT use if you have an allergy to CHG or antibacterial soaps.  If your skin becomes reddened/irritated stop using the CHG and inform your nurse when you arrive at Short Stay. Do not shave (including legs and underarms) for at least 48 hours prior to the first CHG shower.  You may shave your face/neck. Please follow these instructions carefully:  1.  Shower with CHG Soap the night before surgery and the  morning of Surgery.  2.  If you choose to wash your hair, wash your hair first as usual with your  normal  shampoo.  3.  After you shampoo, rinse your hair and body thoroughly to remove the  shampoo.                           4.  Use CHG as you would any other liquid soap.  You can apply chg directly  to the skin and wash                       Gently with a scrungie or clean washcloth.  5.  Apply the CHG Soap to your body ONLY FROM THE NECK DOWN.   Do not use on face/ open                           Wound or open sores. Avoid contact with eyes, ears mouth and genitals (private parts).                       Wash face,  Genitals (private parts) with your normal soap.             6.  Wash thoroughly, paying special attention to the area where your surgery  will be performed.  7.  Thoroughly rinse your body with warm water from the neck down.  8.  DO NOT shower/wash with your normal soap after using and rinsing off  the CHG Soap.                9.  Pat yourself dry with a clean towel.            10.  Wear clean pajamas.            11.  Place clean sheets on your bed the night of your first shower and do not  sleep with pets. Day of Surgery : Do not apply any lotions/deodorants the morning of surgery.  Please wear clean clothes to the hospital/surgery center.  FAILURE TO FOLLOW THESE INSTRUCTIONS MAY RESULT IN THE CANCELLATION OF YOUR SURGERY PATIENT  SIGNATURE_________________________________  NURSE SIGNATURE__________________________________  ________________________________________________________________________   Monique Wagner  An incentive spirometer is a tool that can help keep your lungs clear and active. This tool measures how well you are filling your lungs with each breath. Taking long deep breaths may help reverse or decrease the chance of developing breathing (pulmonary) problems (especially infection) following: A long period of time when you are unable to move or be active. BEFORE THE PROCEDURE  If the spirometer includes an indicator to show your best effort, your nurse or respiratory  therapist will set it to a desired goal. If possible, sit up straight or lean slightly forward. Try not to slouch. Hold the incentive spirometer in an upright position. INSTRUCTIONS FOR USE  Sit on the edge of your bed if possible, or sit up as far as you can in bed or on a chair. Hold the incentive spirometer in an upright position. Breathe out normally. Place the mouthpiece in your mouth and seal your lips tightly around it. Breathe in slowly and as deeply as possible, raising the piston or the ball toward the top of the column. Hold your breath for 3-5 seconds or for as long as possible. Allow the piston or ball to fall to the bottom of the column. Remove the mouthpiece from your mouth and breathe out normally. Rest for a few seconds and repeat Steps 1 through 7 at least 10 times every 1-2 hours when you are awake. Take your time and take a few normal breaths between deep breaths. The spirometer may include an indicator to show your best effort. Use the indicator as a goal to work toward during each repetition. After each set of 10 deep breaths, practice coughing to be sure your lungs are clear. If you have an incision (the cut made at the time of surgery), support your incision when coughing by placing a pillow or rolled up towels  firmly against it. Once you are able to get out of bed, walk around indoors and cough well. You may stop using the incentive spirometer when instructed by your caregiver.  RISKS AND COMPLICATIONS Take your time so you do not get dizzy or light-headed. If you are in pain, you may need to take or ask for pain medication before doing incentive spirometry. It is harder to take a deep breath if you are having pain. AFTER USE Rest and breathe slowly and easily. It can be helpful to keep track of a log of your progress. Your caregiver can provide you with a simple table to help with this. If you are using the spirometer at home, follow these instructions: [place] IF:  You are having difficultly using the spirometer. You have trouble using the spirometer as often as instructed. Your pain medication is not giving enough relief while using the spirometer. You develop fever of 100.5 F (38.1 C) or higher. SEEK IMMEDIATE MEDICAL CARE IF:  You cough up bloody sputum that had not been present before. You develop fever of 102 F (38.9 C) or greater. You develop worsening pain at or near the incision site. MAKE SURE YOU:  Understand these instructions. Will watch your condition. Will get help right away if you are not doing well or get worse. Document Released: 01/28/2007 Document Revised: 12/10/2011 Document Reviewed: 03/31/2007 [facility] Patient Information 2014 [place], [place].   ________________________________________________________________________

## 2021-04-19 ENCOUNTER — Encounter (HOSPITAL_COMMUNITY): Payer: Self-pay

## 2021-04-19 ENCOUNTER — Encounter (HOSPITAL_COMMUNITY)
Admission: RE | Admit: 2021-04-19 | Discharge: 2021-04-19 | Disposition: A | Discharge status: Home / Self Care | Payer: Medicare Other | Source: Ambulatory Visit | Attending: Orthopedic Surgery | Admitting: Orthopedic Surgery

## 2021-04-19 ENCOUNTER — Other Ambulatory Visit: Payer: Self-pay

## 2021-04-19 DIAGNOSIS — Z01818 Encounter for other preprocedural examination: Secondary | ICD-10-CM | POA: Insufficient documentation

## 2021-04-19 HISTORY — DX: Anemia, unspecified: D64.9

## 2021-04-19 LAB — COMPREHENSIVE METABOLIC PANEL
ALT: 13 U/L (ref 0–44)
AST: 18 U/L (ref 15–41)
Albumin: 4.1 g/dL (ref 3.5–5.0)
Alkaline Phosphatase: 40 U/L (ref 38–126)
Anion gap: 8 (ref 5–15)
BUN: 15 mg/dL (ref 8–23)
CO2: 26 mmol/L (ref 22–32)
Calcium: 10.3 mg/dL (ref 8.9–10.3)
Chloride: 103 mmol/L (ref 98–111)
Creatinine, Ser: 1.56 mg/dL — ABNORMAL HIGH (ref 0.44–1.00)
GFR, Estimated: 33 mL/min — ABNORMAL LOW (ref >60)
Glucose, Bld: 100 mg/dL — ABNORMAL HIGH (ref 70–99)
Potassium: 4.2 mmol/L (ref 3.5–5.1)
Sodium: 137 mmol/L (ref 135–145)
Total Bilirubin: 0.7 mg/dL (ref 0.3–1.2)
Total Protein: 7.3 g/dL (ref 6.5–8.1)

## 2021-04-19 LAB — URINALYSIS, ROUTINE W REFLEX MICROSCOPIC
Bacteria, UA: NONE SEEN
Bilirubin Urine: NEGATIVE
Glucose, UA: NEGATIVE mg/dL
Ketones, ur: NEGATIVE mg/dL
Nitrite: NEGATIVE
Protein, ur: NEGATIVE mg/dL
Specific Gravity, Urine: 1.008 (ref 1.005–1.030)
WBC, UA: >50 WBC/hpf — ABNORMAL HIGH (ref 0–5)
pH: 5 (ref 5.0–8.0)

## 2021-04-19 LAB — CBC
HCT: 42.9 % (ref 36.0–46.0)
Hemoglobin: 13.7 g/dL (ref 12.0–15.0)
MCH: 30 pg (ref 26.0–34.0)
MCHC: 31.9 g/dL (ref 30.0–36.0)
MCV: 94.1 fL (ref 80.0–100.0)
Platelets: 98 10*3/uL — ABNORMAL LOW (ref 150–400)
RBC: 4.56 MIL/uL (ref 3.87–5.11)
RDW: 13.2 % (ref 11.5–15.5)
WBC: 8 10*3/uL (ref 4.0–10.5)
nRBC: 0 % (ref 0.0–0.2)

## 2021-04-19 LAB — PROTIME-INR
INR: 1 (ref 0.8–1.2)
Prothrombin Time: 12.8 seconds (ref 11.4–15.2)

## 2021-04-19 LAB — SURGICAL PCR SCREEN
MRSA, PCR: NEGATIVE
Staphylococcus aureus: NEGATIVE

## 2021-04-19 NOTE — Progress Notes (Signed)
Lab. Results: Platelets: 98 Creatinine: 1.56 UA: Leukocytes: large,Hgb urin: moderate WBC,UA: >50

## 2021-04-19 NOTE — Progress Notes (Signed)
COVID Vaccine Completed: Yes Date COVID Vaccine completed: 03/14/21. Second boaster. COVID vaccine manufacturer:    Moderna     PCP - Dr. Mirna Mires Cardiologist - No  Chest x-ray -  EKG -  Stress Test -  ECHO -  Cardiac Cath -  Pacemaker/ICD device last checked:  Sleep Study -  CPAP -   Fasting Blood Sugar -  Checks Blood Sugar _____ times a day  Blood Thinner Instructions: Aspirin Instructions: Last Dose:  Anesthesia review: Hx: HTN,stroke(more than 20 years ago)  Patient denies shortness of breath, fever, cough and chest pain at PAT appointment   Patient verbalized understanding of instructions that were given to them at the PAT appointment. Patient was also instructed that they will need to review over the PAT instructions again at home before surgery.

## 2021-04-21 ENCOUNTER — Other Ambulatory Visit: Payer: Self-pay

## 2021-04-21 ENCOUNTER — Ambulatory Visit: Payer: Medicare Other | Admitting: Cardiology

## 2021-04-21 ENCOUNTER — Encounter: Payer: Self-pay | Admitting: Cardiology

## 2021-04-21 VITALS — BP 123/68 | Temp 97.3°F | Resp 16 | Ht 64.0 in | Wt 155.0 lb

## 2021-04-21 DIAGNOSIS — Z01818 Encounter for other preprocedural examination: Secondary | ICD-10-CM

## 2021-04-21 DIAGNOSIS — E782 Mixed hyperlipidemia: Secondary | ICD-10-CM

## 2021-04-21 MED ORDER — ATORVASTATIN CALCIUM 40 MG PO TABS: 40.0000 mg | ORAL_TABLET | Freq: Every day | ORAL | 3 refills | Status: DC

## 2021-04-21 NOTE — Progress Notes (Signed)
Patient referred by Iona Beard, MD for pre-op risk stratification  Subjective:   Monique Wagner, female    DOB: [birth year], 80 y.o.   MRN: [id number]   Chief Complaint  Patient presents with   Hypertension   Medical Clearance    RT knee     HPI  80 y.o. African American female with hypertension, hyperlipidemia  Patient is going to undergo right knee arthroplasty on 04/26/2021.  She underwent left knee arthroplasty a year ago without any cardiac events.  Patient does not have any baseline general dyspnea symptoms.  He is able to get around in her house but fair functional capacity without any symptoms.  Reviewed recent lab results with the patient, details below.  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   Past Medical History:  Diagnosis Date   Anemia    Arthritis    Diverticulitis    Dyspnea    HTN (hypertension)    Hyperlipidemia    Stroke ([place])    slight stroke 80 years ago - no deficits     \ Past Surgical History:  Procedure Laterality Date   ABDOMINAL HYSTERECTOMY     BREAST SURGERY     KNEE ARTHROPLASTY Left 05/18/2020   Procedure: COMPUTER ASSISTED TOTAL KNEE ARTHROPLASTY;  Surgeon: Rod Can, MD;  Location: [facility];  Service: Orthopedics;  Laterality: Left;     Social History   Tobacco Use  Smoking Status Never  Smokeless Tobacco Never    Social History   Substance and Sexual Activity  Alcohol Use No     Family History  Problem Relation Age of Onset   Arthritis Other    Cancer Other    Diabetes Other    Heart disease Other      Current Outpatient Medications on File Prior to Visit  Medication Sig Dispense Refill   acetaminophen (TYLENOL) 325 MG tablet Take 650 mg by mouth every 6 (six) hours as needed for moderate pain.     Acetaminophen-Codeine 300-30 MG tablet Take 1 tablet by mouth every 12 (twelve) hours as needed for pain.     BLACK CURRANT  SEED OIL PO Take 5 mLs by mouth 2 (two) times a week.     Boric Acid Vaginal (AZO BORIC ACID) 600 MG SUPP Place 600 mg vaginally at bedtime for 7 days. (Patient taking differently: Place 600 mg vaginally at bedtime. 10 day supply) 7 suppository 1   Boric Acid Vaginal (AZO BORIC ACID) 600 MG SUPP Place 600 mg vaginally as needed. Weekly (Patient not taking: No sig reported) 7 suppository 3   cholecalciferol (VITAMIN D3) 25 MCG (1000 UNIT) tablet Take 1,000 Units by mouth daily.     CRANBERRY PO Take 1 tablet by mouth in the morning and at bedtime.     DANDELION PO Take 1 capsule by mouth daily.     dorzolamide-timolol (COSOPT) 22.3-6.8 MG/ML ophthalmic solution Place 1 drop into both eyes 2 (two) times daily.     ezetimibe (ZETIA) 10 MG tablet Take 10 mg by mouth daily.     fenofibrate (TRICOR) 48 MG tablet Take 48 mg by mouth daily.     Milk Thistle 150 MG CAPS Take 150 mg by mouth daily.     mirabegron ER (MYRBETRIQ) 25 MG TB24 tablet Take 1 tablet (25 mg total) by mouth daily. 30 tablet 11   Multiple Vitamins-Minerals (MULTIVITAMIN PO) Take 1 tablet  by mouth daily.      Netarsudil-Latanoprost (ROCKLATAN) 0.02-0.005 % SOLN Place 1 drop into both eyes every evening.     Omega-3 Fatty Acids (FISH OIL) 1200 MG CAPS Take 1,200 mg by mouth daily.     predniSONE (DELTASONE) 10 MG tablet Take 10 mg by mouth daily as needed (for knee/hand pain).     Probiotic CAPS Take 1 capsule by mouth daily.     telmisartan-hydrochlorothiazide (MICARDIS HCT) 40-12.5 MG tablet Take 1 tablet by mouth daily.      Turmeric 450 MG CAPS Take 450 mg by mouth daily.     No current facility-administered medications on file prior to visit.    Cardiovascular and other pertinent studies:  EKG 04/21/2021: Sinus rhythm 84 bpm  Leftward axis Low voltage Nonspecific T-abnormality   Recent labs: 04/11/2021: Glucose 85, BUN/Cr 15/1.22. EGFR 45. Na/K 140/4.2. Calcium 10.5. Rest of the CMP normal H/H 13/40. MCV 92. Platelets  252 Chol 245, TG 190, HDL ?, LDL 153    Review of Systems  Cardiovascular:  Negative for chest pain, dyspnea on exertion, leg swelling, palpitations and syncope.  Musculoskeletal:  Positive for joint pain.        Vitals:   04/21/21 1406  BP: 123/68  Resp: 16  Temp: (!) 97.3 F (36.3 C)     Body mass index is 26.61 kg/m. Filed Weights   04/21/21 1406  Weight: 155 lb (70.3 kg)     Objective:   Physical Exam Vitals and nursing note reviewed.  Constitutional:      General: She is not in acute distress. Neck:     Vascular: No JVD.  Cardiovascular:     Rate and Rhythm: Normal rate and regular rhythm.     Heart sounds: Normal heart sounds. No murmur heard. Pulmonary:     Effort: Pulmonary effort is normal.     Breath sounds: Normal breath sounds. No wheezing or rales.  Musculoskeletal:     Right lower leg: No edema.     Left lower leg: No edema.        Assessment & Recommendations:    80 y.o. African American female with hypertension, hyperlipidemia  Preop risk stratification: Fair functional capacity without any angina or heart failure symptoms.  Normal physical exam.  Normal resting EKG. Okay to proceed with upcoming knee arthroplasty on 7/27 with low cardiac risk  Mixed hyperlipidemia: Given elevated LDL, switch to fenofibrate and ezetimibe to rosuvastatin 40 mg daily. Follow-up with PCP   Thank you for referring the patient to [place]. Please feel free to contact with any questions.   Nigel Mormon, MD Pager: [phone] Office: [phone]

## 2021-04-24 ENCOUNTER — Other Ambulatory Visit (HOSPITAL_COMMUNITY)
Admission: RE | Admit: 2021-04-24 | Discharge: 2021-04-24 | Disposition: A | Discharge status: Home / Self Care | Payer: Medicare Other | Source: Ambulatory Visit | Attending: Orthopedic Surgery | Admitting: Orthopedic Surgery

## 2021-04-24 DIAGNOSIS — Z01812 Encounter for preprocedural laboratory examination: Secondary | ICD-10-CM | POA: Diagnosis present

## 2021-04-24 DIAGNOSIS — Z20822 Contact with and (suspected) exposure to covid-19: Secondary | ICD-10-CM | POA: Diagnosis not present

## 2021-04-24 LAB — SARS CORONAVIRUS 2 (TAT 6-24 HRS): SARS Coronavirus 2: NEGATIVE

## 2021-04-24 NOTE — Telephone Encounter (Signed)
Pt called with lab results and management- Rx had been sent in

## 2021-04-25 NOTE — Anesthesia Preprocedure Evaluation (Addendum)
Anesthesia Evaluation  Patient identified by MRN, date of birth, ID band Patient awake    Reviewed: Allergy & Precautions, NPO status , Patient's Chart, lab work & pertinent test results  Airway Mallampati: II  TM Distance: >3 FB Neck ROM: Full    Dental no notable dental hx. (+) Partial Upper, Lower Dentures   Pulmonary neg pulmonary ROS,    Pulmonary exam normal breath sounds clear to auscultation       Cardiovascular hypertension, Pt. on medications Normal cardiovascular exam Rhythm:Regular Rate:Normal     Neuro/Psych negative neurological ROS     GI/Hepatic negative GI ROS, Neg liver ROS,   Endo/Other    Renal/GU Renal InsufficiencyRenal diseaseLab Results      Component                Value               Date                      CREATININE               1.56 (H)            04/19/2021                BUN                      15                  04/19/2021                NA                       137                 04/19/2021                K                        4.2                 04/19/2021                CL                       103                 04/19/2021                CO2                      26                  04/19/2021                Musculoskeletal  (+) Arthritis ,   Abdominal   Peds  Hematology  (+) anemia , Lab Results      Component                Value               Date                      WBC  8.0                 04/19/2021                HGB                      13.7                04/19/2021                HCT                      42.9                04/19/2021                MCV                      94.1                04/19/2021                PLT                      98 (L)              04/19/2021              Anesthesia Other Findings All: Latex, Tape  Reproductive/Obstetrics                            Anesthesia Physical Anesthesia  Plan  ASA: 2  Anesthesia Plan: Spinal   Post-op Pain Management:  Regional for Post-op pain   Induction:   PONV Risk Score and Plan: 3 and Treatment may vary due to age or medical condition, Ondansetron and Midazolam  Airway Management Planned: Natural Airway  Additional Equipment: None  Intra-op Plan:   Post-operative Plan:   Informed Consent: I have reviewed the patients History and Physical, chart, labs and discussed the procedure including the risks, benefits and alternatives for the proposed anesthesia with the patient or authorized representative who has indicated his/her understanding and acceptance.     Dental advisory given  Plan Discussed with:   Anesthesia Plan Comments: (Spinal w R adductor)       Anesthesia Quick Evaluation

## 2021-04-26 ENCOUNTER — Ambulatory Visit (HOSPITAL_COMMUNITY)
Admission: RE | Admit: 2021-04-26 | Discharge: 2021-04-27 | Disposition: A | Discharge status: Home / Self Care | Payer: Medicare Other | Source: Ambulatory Visit | Attending: Orthopedic Surgery | Admitting: Orthopedic Surgery

## 2021-04-26 ENCOUNTER — Other Ambulatory Visit: Payer: Self-pay

## 2021-04-26 ENCOUNTER — Encounter (HOSPITAL_COMMUNITY)
Admission: RE | Disposition: A | Discharge status: Home / Self Care | Payer: Self-pay | Source: Ambulatory Visit | Attending: Orthopedic Surgery

## 2021-04-26 ENCOUNTER — Ambulatory Visit (HOSPITAL_COMMUNITY): Payer: Medicare Other | Admitting: Anesthesiology

## 2021-04-26 ENCOUNTER — Encounter (HOSPITAL_COMMUNITY): Payer: Self-pay | Admitting: Orthopedic Surgery

## 2021-04-26 ENCOUNTER — Ambulatory Visit (HOSPITAL_COMMUNITY): Payer: Medicare Other | Admitting: Physician Assistant

## 2021-04-26 ENCOUNTER — Ambulatory Visit (HOSPITAL_COMMUNITY): Payer: Medicare Other

## 2021-04-26 DIAGNOSIS — Z888 Allergy status to other drugs, medicaments and biological substances status: Secondary | ICD-10-CM | POA: Diagnosis not present

## 2021-04-26 DIAGNOSIS — Z96652 Presence of left artificial knee joint: Secondary | ICD-10-CM | POA: Diagnosis not present

## 2021-04-26 DIAGNOSIS — M1711 Unilateral primary osteoarthritis, right knee: Secondary | ICD-10-CM | POA: Diagnosis present

## 2021-04-26 DIAGNOSIS — Z79899 Other long term (current) drug therapy: Secondary | ICD-10-CM | POA: Insufficient documentation

## 2021-04-26 HISTORY — PX: KNEE ARTHROPLASTY: SHX992

## 2021-04-26 LAB — TYPE AND SCREEN
ABO/RH(D): A POS
Antibody Screen: NEGATIVE

## 2021-04-26 SURGERY — ARTHROPLASTY, KNEE, TOTAL, USING IMAGELESS COMPUTER-ASSISTED NAVIGATION
Anesthesia: Spinal | Site: Knee | Laterality: Right

## 2021-04-26 MED ORDER — ATORVASTATIN CALCIUM 40 MG PO TABS
40.0000 mg | ORAL_TABLET | Freq: Every day | ORAL | Status: DC
  Administered 2021-04-26 – 2021-04-27 (×2): 40 mg via ORAL
  Filled 2021-04-26 (×2): qty 1

## 2021-04-26 MED ORDER — SODIUM CHLORIDE 0.9 % IV SOLN
2.0000 g | INTRAVENOUS | Status: AC
  Administered 2021-04-26: 2 g via INTRAVENOUS
  Filled 2021-04-26: qty 2

## 2021-04-26 MED ORDER — HYDROCODONE-ACETAMINOPHEN 7.5-325 MG PO TABS: 1.0000 | ORAL_TABLET | ORAL | Status: DC | PRN

## 2021-04-26 MED ORDER — ACETAMINOPHEN 10 MG/ML IV SOLN
1000.0000 mg | Freq: Once | INTRAVENOUS | Status: AC
  Administered 2021-04-26: 1000 mg via INTRAVENOUS
  Filled 2021-04-26: qty 100

## 2021-04-26 MED ORDER — SODIUM CHLORIDE 0.9 % IR SOLN
Status: DC | PRN
  Administered 2021-04-26: 1000 mL

## 2021-04-26 MED ORDER — ACETAMINOPHEN 10 MG/ML IV SOLN: 1000.0000 mg | Freq: Once | INTRAVENOUS | Status: DC | PRN

## 2021-04-26 MED ORDER — PHENOL 1.4 % MT LIQD: 1.0000 | OROMUCOSAL | Status: DC | PRN

## 2021-04-26 MED ORDER — SODIUM CHLORIDE 0.9 % IV SOLN: INTRAVENOUS | Status: DC

## 2021-04-26 MED ORDER — POLYETHYLENE GLYCOL 3350 17 G PO PACK: 17.0000 g | PACK | Freq: Every day | ORAL | Status: DC | PRN

## 2021-04-26 MED ORDER — METOCLOPRAMIDE HCL 5 MG PO TABS: 5.0000 mg | ORAL_TABLET | Freq: Three times a day (TID) | ORAL | Status: DC | PRN

## 2021-04-26 MED ORDER — MIDAZOLAM HCL 2 MG/2ML IJ SOLN
1.0000 mg | INTRAMUSCULAR | Status: DC
  Filled 2021-04-26: qty 2

## 2021-04-26 MED ORDER — MIRABEGRON ER 25 MG PO TB24
25.0000 mg | ORAL_TABLET | Freq: Every day | ORAL | Status: DC
  Administered 2021-04-27: 25 mg via ORAL
  Filled 2021-04-26: qty 1

## 2021-04-26 MED ORDER — METOCLOPRAMIDE HCL 5 MG/ML IJ SOLN: 5.0000 mg | Freq: Three times a day (TID) | INTRAMUSCULAR | Status: DC | PRN

## 2021-04-26 MED ORDER — STERILE WATER FOR IRRIGATION IR SOLN
Status: DC | PRN
  Administered 2021-04-26: 2000 mL

## 2021-04-26 MED ORDER — ONDANSETRON HCL 4 MG PO TABS: 4.0000 mg | ORAL_TABLET | Freq: Four times a day (QID) | ORAL | Status: DC | PRN

## 2021-04-26 MED ORDER — ALUM & MAG HYDROXIDE-SIMETH 200-200-20 MG/5ML PO SUSP: 30.0000 mL | ORAL | Status: DC | PRN

## 2021-04-26 MED ORDER — POVIDONE-IODINE 10 % EX SWAB: 2.0000 "application " | Freq: Once | CUTANEOUS | Status: DC

## 2021-04-26 MED ORDER — SODIUM CHLORIDE 0.9% FLUSH
INTRAVENOUS | Status: DC | PRN
  Administered 2021-04-26: 30 mL

## 2021-04-26 MED ORDER — DEXAMETHASONE SODIUM PHOSPHATE 10 MG/ML IJ SOLN
10.0000 mg | Freq: Once | INTRAMUSCULAR | Status: AC
  Administered 2021-04-27: 10 mg via INTRAVENOUS
  Filled 2021-04-26: qty 1

## 2021-04-26 MED ORDER — CELECOXIB 200 MG PO CAPS
200.0000 mg | ORAL_CAPSULE | Freq: Two times a day (BID) | ORAL | Status: DC
  Administered 2021-04-26 – 2021-04-27 (×2): 200 mg via ORAL
  Filled 2021-04-26 (×2): qty 1

## 2021-04-26 MED ORDER — POVIDONE-IODINE 10 % EX SWAB
2.0000 "application " | Freq: Once | CUTANEOUS | Status: AC
  Administered 2021-04-26: 2 via TOPICAL

## 2021-04-26 MED ORDER — ORAL CARE MOUTH RINSE: 15.0000 mL | Freq: Once | OROMUCOSAL | Status: AC

## 2021-04-26 MED ORDER — DIPHENHYDRAMINE HCL 12.5 MG/5ML PO ELIX: 12.5000 mg | ORAL_SOLUTION | ORAL | Status: DC | PRN

## 2021-04-26 MED ORDER — MENTHOL 3 MG MT LOZG: 1.0000 | LOZENGE | OROMUCOSAL | Status: DC | PRN

## 2021-04-26 MED ORDER — PROPOFOL 500 MG/50ML IV EMUL: INTRAVENOUS | Status: DC | PRN

## 2021-04-26 MED ORDER — FENTANYL CITRATE (PF) 100 MCG/2ML IJ SOLN
25.0000 ug | INTRAMUSCULAR | Status: DC | PRN
  Administered 2021-04-26 (×2): 50 ug via INTRAVENOUS

## 2021-04-26 MED ORDER — SODIUM CHLORIDE 0.9 % IV SOLN
2.0000 g | Freq: Three times a day (TID) | INTRAVENOUS | Status: AC
  Administered 2021-04-26 – 2021-04-27 (×2): 2 g via INTRAVENOUS
  Filled 2021-04-26 (×2): qty 2

## 2021-04-26 MED ORDER — MORPHINE SULFATE (PF) 2 MG/ML IV SOLN: 0.5000 mg | INTRAVENOUS | Status: DC | PRN

## 2021-04-26 MED ORDER — 0.9 % SODIUM CHLORIDE (POUR BTL) OPTIME
TOPICAL | Status: DC | PRN
  Administered 2021-04-26: 1000 mL

## 2021-04-26 MED ORDER — SODIUM CHLORIDE 0.9 % IV SOLN: 2.0000 g | Freq: Four times a day (QID) | INTRAVENOUS | Status: DC

## 2021-04-26 MED ORDER — LACTATED RINGERS IV SOLN: INTRAVENOUS | Status: DC

## 2021-04-26 MED ORDER — SENNA 8.6 MG PO TABS
1.0000 | ORAL_TABLET | Freq: Two times a day (BID) | ORAL | Status: DC
  Administered 2021-04-26 – 2021-04-27 (×2): 8.6 mg via ORAL
  Filled 2021-04-26 (×2): qty 1

## 2021-04-26 MED ORDER — BUPIVACAINE-EPINEPHRINE 0.25% -1:200000 IJ SOLN
INTRAMUSCULAR | Status: DC | PRN
  Administered 2021-04-26: 30 mL

## 2021-04-26 MED ORDER — DOCUSATE SODIUM 100 MG PO CAPS
100.0000 mg | ORAL_CAPSULE | Freq: Two times a day (BID) | ORAL | Status: DC
  Administered 2021-04-26 – 2021-04-27 (×2): 100 mg via ORAL
  Filled 2021-04-26 (×2): qty 1

## 2021-04-26 MED ORDER — SODIUM CHLORIDE (PF) 0.9 % IJ SOLN
INTRAMUSCULAR | Status: AC
  Filled 2021-04-26: qty 30

## 2021-04-26 MED ORDER — BUPIVACAINE IN DEXTROSE 0.75-8.25 % IT SOLN
INTRATHECAL | Status: DC | PRN
  Administered 2021-04-26: 1.4 mL via INTRATHECAL

## 2021-04-26 MED ORDER — CHLORHEXIDINE GLUCONATE 0.12 % MT SOLN
15.0000 mL | Freq: Once | OROMUCOSAL | Status: AC
  Administered 2021-04-26: 15 mL via OROMUCOSAL

## 2021-04-26 MED ORDER — ASPIRIN 81 MG PO CHEW
81.0000 mg | CHEWABLE_TABLET | Freq: Two times a day (BID) | ORAL | Status: DC
  Administered 2021-04-26 – 2021-04-27 (×2): 81 mg via ORAL
  Filled 2021-04-26 (×2): qty 1

## 2021-04-26 MED ORDER — SODIUM CHLORIDE (PF) 0.9 % IJ SOLN
INTRAMUSCULAR | Status: DC | PRN
  Administered 2021-04-26: 50 mL

## 2021-04-26 MED ORDER — HYDROCODONE-ACETAMINOPHEN 5-325 MG PO TABS: 1.0000 | ORAL_TABLET | ORAL | Status: DC | PRN

## 2021-04-26 MED ORDER — FENTANYL CITRATE (PF) 100 MCG/2ML IJ SOLN
INTRAMUSCULAR | Status: AC
  Filled 2021-04-26: qty 2

## 2021-04-26 MED ORDER — FENTANYL CITRATE (PF) 100 MCG/2ML IJ SOLN
INTRAMUSCULAR | Status: DC | PRN
  Administered 2021-04-26 (×2): 25 ug via INTRAVENOUS

## 2021-04-26 MED ORDER — ONDANSETRON HCL 4 MG/2ML IJ SOLN: 4.0000 mg | Freq: Four times a day (QID) | INTRAMUSCULAR | Status: DC | PRN

## 2021-04-26 MED ORDER — ISOPROPYL ALCOHOL 70 % SOLN
Status: DC | PRN
  Administered 2021-04-26: 1 via TOPICAL

## 2021-04-26 MED ORDER — DEXAMETHASONE SODIUM PHOSPHATE 10 MG/ML IJ SOLN
INTRAMUSCULAR | Status: DC | PRN
  Administered 2021-04-26: 10 mg via INTRAVENOUS

## 2021-04-26 MED ORDER — PHENYLEPHRINE HCL-NACL 20-0.9 MG/250ML-% IV SOLN
INTRAVENOUS | Status: DC | PRN
  Administered 2021-04-26: 25 ug/min via INTRAVENOUS

## 2021-04-26 MED ORDER — ACETAMINOPHEN 325 MG PO TABS: 325.0000 mg | ORAL_TABLET | Freq: Four times a day (QID) | ORAL | Status: DC | PRN

## 2021-04-26 MED ORDER — ONDANSETRON HCL 4 MG/2ML IJ SOLN: 4.0000 mg | Freq: Once | INTRAMUSCULAR | Status: DC | PRN

## 2021-04-26 MED ORDER — DORZOLAMIDE HCL-TIMOLOL MAL 2-0.5 % OP SOLN
1.0000 [drp] | Freq: Two times a day (BID) | OPHTHALMIC | Status: DC
  Administered 2021-04-27: 1 [drp] via OPHTHALMIC
  Filled 2021-04-26: qty 10

## 2021-04-26 MED ORDER — PHENYLEPHRINE HCL (PRESSORS) 10 MG/ML IV SOLN
INTRAVENOUS | Status: AC
  Filled 2021-04-26: qty 2

## 2021-04-26 MED ORDER — TRANEXAMIC ACID-NACL 1000-0.7 MG/100ML-% IV SOLN
1000.0000 mg | INTRAVENOUS | Status: AC
  Administered 2021-04-26: 1000 mg via INTRAVENOUS
  Filled 2021-04-26: qty 100

## 2021-04-26 MED ORDER — BUPIVACAINE-EPINEPHRINE (PF) 0.25% -1:200000 IJ SOLN
INTRAMUSCULAR | Status: AC
  Filled 2021-04-26: qty 30

## 2021-04-26 MED ORDER — KETOROLAC TROMETHAMINE 30 MG/ML IJ SOLN
INTRAMUSCULAR | Status: DC | PRN
  Administered 2021-04-26: 30 mg

## 2021-04-26 MED ORDER — PROPOFOL 500 MG/50ML IV EMUL
INTRAVENOUS | Status: DC | PRN
  Administered 2021-04-26: 40 ug/kg/min via INTRAVENOUS

## 2021-04-26 MED ORDER — KETOROLAC TROMETHAMINE 30 MG/ML IJ SOLN
INTRAMUSCULAR | Status: AC
  Filled 2021-04-26: qty 1

## 2021-04-26 MED ORDER — FENTANYL CITRATE (PF) 100 MCG/2ML IJ SOLN
50.0000 ug | INTRAMUSCULAR | Status: DC
  Administered 2021-04-26: 50 ug via INTRAVENOUS
  Filled 2021-04-26: qty 2

## 2021-04-26 SURGICAL SUPPLY — 78 items
ADH SKN CLS APL DERMABOND .7 (GAUZE/BANDAGES/DRESSINGS) ×1
APL PRP STRL LF DISP 70% ISPRP (MISCELLANEOUS) ×2
BAG COUNTER SPONGE SURGICOUNT (BAG) IMPLANT
BAG SPEC THK2 15X12 ZIP CLS (MISCELLANEOUS)
BAG SPNG CNTER NS LX DISP (BAG)
BAG ZIPLOCK 12X15 (MISCELLANEOUS) IMPLANT
BATTERY INSTRU NAVIGATION (MISCELLANEOUS) ×6 IMPLANT
BEARING TIBIA INSRT KNEE SZ4 9 (Insert) IMPLANT
BLADE SAW RECIPROCATING 77.5 (BLADE) ×2 IMPLANT
BNDG ELASTIC 4X5.8 VLCR STR LF (GAUZE/BANDAGES/DRESSINGS) ×2 IMPLANT
BNDG ELASTIC 6X5.8 VLCR STR LF (GAUZE/BANDAGES/DRESSINGS) ×2 IMPLANT
BSPLAT TIB 4 KN TRITANIUM (Knees) ×1 IMPLANT
BTRY SRG DRVR LF (MISCELLANEOUS) ×3
CHLORAPREP W/TINT 26 (MISCELLANEOUS) ×4 IMPLANT
COMPONENT TRI CR FEM SZ5 KNEE (Orthopedic Implant) IMPLANT
COVER SURGICAL LIGHT HANDLE (MISCELLANEOUS) ×2 IMPLANT
CUFF TOURN SGL QUICK 34 (TOURNIQUET CUFF) ×2
CUFF TRNQT CYL 34X4.125X (TOURNIQUET CUFF) ×1 IMPLANT
DECANTER SPIKE VIAL GLASS SM (MISCELLANEOUS) ×4 IMPLANT
DERMABOND ADVANCED (GAUZE/BANDAGES/DRESSINGS) ×1
DERMABOND ADVANCED .7 DNX12 (GAUZE/BANDAGES/DRESSINGS) ×2 IMPLANT
DRAPE SHEET LG 3/4 BI-LAMINATE (DRAPES) ×6 IMPLANT
DRAPE U-SHAPE 47X51 STRL (DRAPES) ×2 IMPLANT
DRSG AQUACEL AG ADV 3.5X10 (GAUZE/BANDAGES/DRESSINGS) ×2 IMPLANT
DRSG TEGADERM 4X4.75 (GAUZE/BANDAGES/DRESSINGS) IMPLANT
ELECT BLADE TIP CTD 4 INCH (ELECTRODE) ×2 IMPLANT
ELECT REM PT RETURN 15FT ADLT (MISCELLANEOUS) ×2 IMPLANT
EVACUATOR 1/8 PVC DRAIN (DRAIN) IMPLANT
GAUZE SPONGE 4X4 12PLY STRL (GAUZE/BANDAGES/DRESSINGS) ×2 IMPLANT
GLOVE SRG 8 PF TXTR STRL LF DI (GLOVE) ×2 IMPLANT
GLOVE SURG ENC MOIS LTX SZ8.5 (GLOVE) ×4 IMPLANT
GLOVE SURG ENC TEXT LTX SZ7.5 (GLOVE) ×6 IMPLANT
GLOVE SURG UNDER POLY LF SZ8 (GLOVE) ×4
GLOVE SURG UNDER POLY LF SZ8.5 (GLOVE) ×2 IMPLANT
GOWN SPEC L3 XXLG W/TWL (GOWN DISPOSABLE) ×2 IMPLANT
GOWN SPEC L4 XLG W/TWL (GOWN DISPOSABLE) ×2 IMPLANT
HANDPIECE INTERPULSE COAX TIP (DISPOSABLE) ×2
HOLDER FOLEY CATH W/STRAP (MISCELLANEOUS) ×2 IMPLANT
HOOD PEEL AWAY FLYTE STAYCOOL (MISCELLANEOUS) ×6 IMPLANT
JET LAVAGE IRRISEPT WOUND (IRRIGATION / IRRIGATOR)
KIT TURNOVER KIT A (KITS) ×2 IMPLANT
KNEE PATELLA ASYMMETRIC 10X32 (Knees) ×1 IMPLANT
KNEE TIBIAL COMP TRI SZ4 (Knees) ×1 IMPLANT
LAVAGE JET IRRISEPT WOUND (IRRIGATION / IRRIGATOR) IMPLANT
MARKER SKIN DUAL TIP RULER LAB (MISCELLANEOUS) ×2 IMPLANT
NDL SAFETY ECLIPSE 18X1.5 (NEEDLE) ×1 IMPLANT
NDL SPNL 18GX3.5 QUINCKE PK (NEEDLE) ×1 IMPLANT
NEEDLE HYPO 18GX1.5 SHARP (NEEDLE) ×2
NEEDLE SPNL 18GX3.5 QUINCKE PK (NEEDLE) ×2 IMPLANT
NS IRRIG 1000ML POUR BTL (IV SOLUTION) ×2 IMPLANT
PACK TOTAL KNEE CUSTOM (KITS) ×2 IMPLANT
PADDING CAST COTTON 6X4 STRL (CAST SUPPLIES) ×2 IMPLANT
PENCIL SMOKE EVACUATOR (MISCELLANEOUS) IMPLANT
PIN FLUTED HEDLESS FIX 3.5X1/8 (PIN) ×1 IMPLANT
PROTECTOR NERVE ULNAR (MISCELLANEOUS) ×2 IMPLANT
SAW OSC TIP CART 19.5X105X1.3 (SAW) ×2 IMPLANT
SEALER BIPOLAR AQUA 6.0 (INSTRUMENTS) ×2 IMPLANT
SET HNDPC FAN SPRY TIP SCT (DISPOSABLE) ×1 IMPLANT
SET PAD KNEE POSITIONER (MISCELLANEOUS) ×2 IMPLANT
SPONGE DRAIN TRACH 4X4 STRL 2S (GAUZE/BANDAGES/DRESSINGS) IMPLANT
SUT MNCRL AB 3-0 PS2 18 (SUTURE) ×2 IMPLANT
SUT MNCRL AB 4-0 PS2 18 (SUTURE) ×2 IMPLANT
SUT MON AB 2-0 CT1 36 (SUTURE) ×2 IMPLANT
SUT STRATAFIX PDO 1 14 VIOLET (SUTURE) ×2
SUT STRATFX PDO 1 14 VIOLET (SUTURE) ×1
SUT VIC AB 1 CTX 36 (SUTURE) ×4
SUT VIC AB 1 CTX36XBRD ANBCTR (SUTURE) ×2 IMPLANT
SUT VIC AB 2-0 CT1 27 (SUTURE) ×2
SUT VIC AB 2-0 CT1 TAPERPNT 27 (SUTURE) ×1 IMPLANT
SUTURE STRATFX PDO 1 14 VIOLET (SUTURE) ×1 IMPLANT
SYR 3ML LL SCALE MARK (SYRINGE) ×2 IMPLANT
TIBIA BEAR INSERT KNEE SZ4 9 (Insert) ×2 IMPLANT
TOWER CARTRIDGE SMART MIX (DISPOSABLE) IMPLANT
TRAY FOLEY MTR SLVR 16FR STAT (SET/KITS/TRAYS/PACK) IMPLANT
TRI CRUC RET FEM SZ5 KNEE (Orthopedic Implant) ×2 IMPLANT
TUBE SUCTION HIGH CAP CLEAR NV (SUCTIONS) ×2 IMPLANT
WATER STERILE IRR 1000ML POUR (IV SOLUTION) ×4 IMPLANT
WRAP KNEE MAXI GEL POST OP (GAUZE/BANDAGES/DRESSINGS) ×1 IMPLANT

## 2021-04-26 NOTE — Interval H&P Note (Signed)
History and Physical Interval Note:  04/26/2021 9:37 AM  Monique Wagner  has presented today for surgery, with the diagnosis of right knee osteoarthritis.  The various methods of treatment have been discussed with the patient and family. After consideration of risks, benefits and other options for treatment, the patient has consented to  Procedure(s): COMPUTER ASSISTED TOTAL KNEE ARTHROPLASTY (Right) as a surgical intervention.  The patient's history has been reviewed, patient examined, no change in status, stable for surgery.  I have reviewed the patient's chart and labs.  Questions were answered to the patient's satisfaction.     [name]

## 2021-04-26 NOTE — Evaluation (Signed)
Physical Therapy Evaluation Patient Details Name: Monique Wagner MRN: [id number] DOB: 04-Jan-1941 Today's Date: 04/26/2021   History of Present Illness  Patient is 80 y.o. female s/p Rt TKA on 04/26/21  with PMH significant for stroke, HLD, HTN, dyspnea, OA, Lt TKA on 05/18/20.    Clinical Impression  Monique Wagner is a 80 y.o. female POD 0 s/p Rt TKA. Patient reports independence with mobility at baseline. Patient is now limited by functional impairments (see PT problem list below) and requires min assist for transfers and gait with RW. Patient was able to ambulate ~60 feet with RW and min assist. Patient instructed in exercise to facilitate ROM and circulation to manage edema and reduce risk of DVT. Patient will benefit from continued skilled PT interventions to address impairments and progress towards PLOF. Acute PT will follow to progress mobility and stair training in preparation for safe discharge home.     Follow Up Recommendations Follow surgeon's recommendation for DC plan and follow-up therapies    Equipment Recommendations  None recommended by PT    Recommendations for Other Services       Precautions / Restrictions Precautions Precautions: Fall Restrictions Weight Bearing Restrictions: No Other Position/Activity Restrictions: WBAT      Mobility  Bed Mobility Overal bed mobility: Needs Assistance Bed Mobility: Supine to Sit     Supine to sit: HOB elevated;Min guard     General bed mobility comments: ming uard for safety and cues to use bed rail. pt able to bring Rt LE off EOB and scoot.    Transfers Overall transfer level: Needs assistance Equipment used: Rolling walker (2 wheeled) Transfers: Sit to/from Stand Sit to Stand: Min assist         General transfer comment: cues for hand placement/technique and assist to initiate and steady with power up.  Ambulation/Gait Ambulation/Gait assistance: Min assist Gait Distance (Feet): 60 Feet Assistive device:  Rolling walker (2 wheeled) Gait Pattern/deviations: Step-to pattern;Decreased stride length;Decreased weight shift to right;Trunk flexed Gait velocity: decr   General Gait Details: cues for step to pattern and inttermittent assist to maintain safe proximity to [employer] Mobility    Modified Rankin (Stroke Patients Only)       Balance Overall balance assessment: Needs assistance Sitting-balance support: Feet supported Sitting balance-Leahy Scale: Good     Standing balance support: During functional activity;Bilateral upper extremity supported Standing balance-Leahy Scale: Poor                               Pertinent Vitals/Pain Pain Assessment: No/denies pain    Home Living Family/patient expects to be discharged to:: Private residence Living Arrangements: Other relatives Available Help at Discharge: Family Type of Home: House Home Access: Ramped entrance     Home Layout: One level Home Equipment: Cane - single point;Walker - 2 wheels;Bedside commode;Shower seat Additional Comments: pt is planning to stay at her sisters while she first recovers    Prior Function Level of Independence: Independent               Hand Dominance   Dominant Hand: Right    Extremity/Trunk Assessment   Upper Extremity Assessment Upper Extremity Assessment: Overall WFL for tasks assessed    Lower Extremity Assessment Lower Extremity Assessment: Generalized weakness;RLE deficits/detail RLE Deficits / Details: good quad activation, no extensor lag with SLR RLE Sensation: WNL RLE  Coordination: WNL    Cervical / Trunk Assessment Cervical / Trunk Assessment: Normal  Communication   Communication: No difficulties;HOH (HOH?)  Cognition Arousal/Alertness: Awake/alert Behavior During Therapy: WFL for tasks assessed/performed Overall Cognitive Status: Within Functional Limits for tasks assessed                                         General Comments      Exercises Total Joint Exercises Ankle Circles/Pumps: AROM;Both;20 reps;Seated Quad Sets: AROM;Right;5 reps;Seated Heel Slides: AROM;Right;5 reps;Seated   Assessment/Plan    PT Assessment Patient needs continued PT services  PT Problem List Decreased strength;Decreased range of motion;Decreased activity tolerance;Decreased balance;Decreased mobility;Decreased knowledge of use of DME;Pain       PT Treatment Interventions DME instruction;Gait training;Stair training;Functional mobility training;Therapeutic activities;Therapeutic exercise;Balance training;Patient/family education    PT Goals (Current goals can be found in the Care Plan section)  Acute Rehab PT Goals Patient Stated Goal: get home and independent again PT Goal Formulation: With patient Time For Goal Achievement: 05/03/21 Potential to Achieve Goals: Good    Frequency 7X/week   Barriers to discharge        Co-evaluation               AM-PAC PT "6 Clicks" Mobility  Outcome Measure Help needed turning from your back to your side while in a flat bed without using bedrails?: A Little Help needed moving from lying on your back to sitting on the side of a flat bed without using bedrails?: A Little Help needed moving to and from a bed to a chair (including a wheelchair)?: A Little Help needed standing up from a chair using your arms (e.g., wheelchair or bedside chair)?: A Little Help needed to walk in hospital room?: A Little Help needed climbing 3-5 steps with a railing? : A Little 6 Click Score: 18    End of Session Equipment Utilized During Treatment: Gait belt Activity Tolerance: Patient tolerated treatment well Patient left: in chair;with call bell/phone within reach;with chair alarm set;with family/visitor present Nurse Communication: Mobility status PT Visit Diagnosis: Muscle weakness (generalized) (M62.81);Difficulty in walking, not elsewhere classified (R26.2)     Time: 0923-3007 PT Time Calculation (min) (ACUTE ONLY): 23 min   Charges:   PT Evaluation $PT Eval Low Complexity: 1 Low PT Treatments $Gait Training: 8-22 mins        Wynn Maudlin, DPT Acute Rehabilitation Services Office [phone] Pager [phone]   Anitra Lauth 04/26/2021, 6:49 PM

## 2021-04-26 NOTE — Plan of Care (Signed)
?  Problem: Education: ?Goal: Knowledge of General Education information will improve ?Description: Including pain rating scale, medication(s)/side effects and non-pharmacologic comfort measures ?Outcome: Progressing ?  ?Problem: Activity: ?Goal: Risk for activity intolerance will decrease ?Outcome: Progressing ?  ?Problem: Nutrition: ?Goal: Adequate nutrition will be maintained ?Outcome: Progressing ?  ?Problem: Elimination: ?Goal: Will not experience complications related to bowel motility ?Outcome: Progressing ?  ?Problem: Pain Managment: ?Goal: General experience of comfort will improve ?Outcome: Progressing ?  ?Problem: Education: ?Goal: Knowledge of the prescribed therapeutic regimen will improve ?Outcome: Progressing ?  ?Problem: Activity: ?Goal: Ability to avoid complications of mobility impairment will improve ?Outcome: Progressing ?  ?Problem: Pain Management: ?Goal: Pain level will decrease with appropriate interventions ?Outcome: Progressing ?  ?

## 2021-04-26 NOTE — Progress Notes (Signed)
AssistedDr. Houser with right, ultrasound guided, adductor canal block. Side rails up, monitors on throughout procedure. See vital signs in flow sheet. Tolerated Procedure well.  

## 2021-04-26 NOTE — Anesthesia Procedure Notes (Signed)
Spinal  Patient location during procedure: OR Start time: 04/26/2021 10:40 AM Reason for block: surgical anesthesia Staffing Performed: resident/CRNA  Anesthesiologist: Barnet Glasgow, MD Resident/CRNA: Gerald Leitz, CRNA Preanesthetic Checklist Completed: patient identified, IV checked, site marked, risks and benefits discussed, surgical consent, monitors and equipment checked, pre-op evaluation and timeout performed Spinal Block Patient position: sitting Prep: DuraPrep Patient monitoring: heart rate, continuous pulse ox, blood pressure and cardiac monitor Approach: midline Location: L3-4 Injection technique: single-shot Needle Needle type: Whitacre, Introducer and Pencan  Needle gauge: 25 G Needle length: 9 cm Assessment Sensory level: T4 Events: CSF return Additional Notes Negative paresthesia. Negative blood return. Positive free-flowing CSF. Expiration date of kit checked and confirmed. Patient tolerated procedure well, without complications.

## 2021-04-26 NOTE — Plan of Care (Signed)
  Problem: Education: Goal: Knowledge of General Education information will improve Description: Including pain rating scale, medication(s)/side effects and non-pharmacologic comfort measures Outcome: Progressing   Problem: Activity: Goal: Risk for activity intolerance will decrease Outcome: Progressing   Problem: Nutrition: Goal: Adequate nutrition will be maintained Outcome: Progressing   Problem: Elimination: Goal: Will not experience complications related to bowel motility Outcome: Progressing   Problem: Pain Managment: Goal: General experience of comfort will improve Outcome: Progressing   Problem: Education: Goal: Knowledge of the prescribed therapeutic regimen will improve 04/26/2021 1655 by Marni Griffon I, RN Outcome: Progressing 04/26/2021 1652 by Marni Griffon I, RN Outcome: Progressing   Problem: Activity: Goal: Ability to avoid complications of mobility impairment will improve 04/26/2021 1655 by Marni Griffon I, RN Outcome: Progressing 04/26/2021 1652 by Marni Griffon I, RN Outcome: Progressing Goal: Range of joint motion will improve Outcome: Progressing   Problem: Pain Management: Goal: Pain level will decrease with appropriate interventions Outcome: Progressing

## 2021-04-26 NOTE — Op Note (Signed)
OPERATIVE REPORT  SURGEON: Rod Can, MD   ASSISTANT: Cherlynn June, PA-C  PREOPERATIVE DIAGNOSIS: Right knee arthritis.   POSTOPERATIVE DIAGNOSIS: Right knee arthritis.   PROCEDURE: Right total knee arthroplasty.   IMPLANTS: Stryker Triathlon CR femur, size 5. Stryker Tritanium tibia, size 4. X3 polyethelyene insert, size 9 mm, CR. 3 button asymmetric patella, size 32 mm.  ANESTHESIA:  MAC, Regional, and Spinal  TOURNIQUET TIME: Not utilized.   ESTIMATED BLOOD LOSS:-100 mL    ANTIBIOTICS: 2 g Ancef.  DRAINS: None.  COMPLICATIONS: None   CONDITION: PACU - hemodynamically stable.   BRIEF CLINICAL NOTE: Monique Wagner is a 80 y.o. female with a long-standing history of Right knee arthritis. After failing conservative management, the patient was indicated for total knee arthroplasty. The risks, benefits, and alternatives to the procedure were explained, and the patient elected to proceed.  PROCEDURE IN DETAIL: Adductor canal block was obtained in the pre-op holding area. Once inside the operative room, spinal anesthesia was obtained, and a foley catheter was inserted. The patient was then positioned, a nonsterile tourniquet was placed, and the lower extremity was prepped and draped in the normal sterile surgical fashion.  A time-out was called verifying side and site of surgery. The patient received IV antibiotics within 60 minutes of beginning the procedure. The tourniquet was not utilized.   An anterior approach to the knee was performed utilizing a medial parapatellar arthrotomy. A medial release was performed and the patellar fat pad was excised. Stryker navigation was used to cut the distal femur perpendicular to the mechanical axis. A freehand patellar resection was performed, and the patella was sized an prepared with 3 lug holes.  Nagivation was used to make a neutral proximal tibia resection, taking 8 mm of  bone from the less affected lateral side with 3 degrees of slope. The menisci were excised. A spacer block was placed, and the alignment and balance in extension were confirmed.   The distal femur was sized using the 3-degree external rotation guide referencing the posterior femoral cortex. The appropriate 4-in-1 cutting block was pinned into place. Rotation was checked using Whiteside's line, the epicondylar axis, and then confirmed with a spacer block in flexion. The remaining femoral cuts were performed, taking care to protect the MCL.  The tibia was sized and the trial tray was pinned into place. The remaining trail components were inserted. The knee was stable to varus and valgus stress through a full range of motion. The patella tracked centrally, and the PCL was well balanced. The trial components were removed, and the proximal tibial surface was prepared. Final components were impacted into place. The knee was tested for a final time and found to be well balanced.   The wound was copiously irrigated with Irrisept solution and normal saline using pule lavage.  Marcaine solution was injected into the periarticular soft tissue.  The wound was closed in layers using #1 Vicryl and Stratafix for the fascia, 2-0 Vicryl for the subcutaneous fat, 2-0 Monocryl for the deep dermal layer, 3-0 running Monocryl subcuticular Stitch, and 4-0 Monocryl stay sutures at both ends of the wound. Dermabond was applied to the skin.  Once the glue was fully dried, an Aquacell Ag and compressive dressing were applied.  Tthe patient was transported to the recovery room in stable condition.  Sponge, needle, and instrument counts were correct at the end of the case x2.  The patient tolerated the procedure well and there were no known complications.  Please note  that a surgical assistant was a medical necessity for this procedure in order to perform it in a safe and expeditious manner. Surgical assistant was necessary to retract  the ligaments and vital neurovascular structures to prevent injury to them and also necessary for proper positioning of the limb to allow for anatomic placement of the prosthesis.

## 2021-04-26 NOTE — Anesthesia Postprocedure Evaluation (Signed)
Anesthesia Post Note  Patient: Monique Wagner  Procedure(s) Performed: COMPUTER ASSISTED TOTAL KNEE ARTHROPLASTY (Right: Knee)     Patient location during evaluation: PACU Anesthesia Type: Spinal Level of consciousness: oriented and awake and alert Pain management: pain level controlled Vital Signs Assessment: post-procedure vital signs reviewed and stable Respiratory status: spontaneous breathing, respiratory function stable and nonlabored ventilation Cardiovascular status: blood pressure returned to baseline and stable Postop Assessment: no headache, no backache, spinal receding and patient able to bend at knees Anesthetic complications: no   No notable events documented.  Last Vitals:  Vitals:   04/26/21 1526 04/26/21 1735  BP: (!) 104/52 (!) 103/58  Pulse: 65 70  Resp: 18 16  Temp: (!) 36.4 C 36.4 C  SpO2: 100% 99%    Last Pain:  Vitals:   04/26/21 1526  TempSrc:   PainSc: 3                  Mackinzee Roszak A.

## 2021-04-26 NOTE — Discharge Instructions (Signed)
 Dr. Careena Degraffenreid Total Joint Specialist [facility] [address] [place], [place] [address] [phone]  TOTAL KNEE REPLACEMENT POSTOPERATIVE DIRECTIONS    Knee Rehabilitation, Guidelines Following Surgery  Results after knee surgery are often greatly improved when you follow the exercise, range of motion and muscle strengthening exercises prescribed by your doctor. Safety measures are also important to protect the knee from further injury. Any time any of these exercises cause you to have increased pain or swelling in your knee joint, decrease the amount until you are comfortable again and slowly increase them. If you have problems or questions, call your caregiver or physical therapist for advice.   WEIGHT BEARING Weight bearing as tolerated with assist device (walker, cane, etc) as directed, use it as long as suggested by your surgeon or therapist, typically at least 4-6 weeks.  HOME CARE INSTRUCTIONS  Remove items at home which could result in a fall. This includes throw rugs or furniture in walking pathways.  Continue medications as instructed at time of discharge. You may have some home medications which will be placed on hold until you complete the course of blood thinner medication.  You may start showering once you are discharged home but do not submerge the incision under water. Just pat the incision dry and apply a dry gauze dressing on daily. Walk with walker as instructed.  You may resume a sexual relationship in one month or when given the OK by your doctor.  Use walker as long as suggested by your caregivers. Avoid periods of inactivity such as sitting longer than an hour when not asleep. This helps prevent blood clots.  You may put full weight on your legs and walk as much as is comfortable.  You may return to work once you are cleared by your doctor.  Do not drive a car for 6 weeks or until released by you surgeon.  Do not drive while  taking narcotics.  Wear the elastic stockings for three weeks following surgery during the day but you may remove then at night. Make sure you keep all of your appointments after your operation with all of your doctors and caregivers. You should call the office at the above phone number and make an appointment for approximately two weeks after the date of your surgery. Do not remove your surgical dressing. The dressing is waterproof; you may take showers in 3 days, but do not take tub baths or submerge the dressing. Please pick up a stool softener and laxative for home use as long as you are requiring pain medications. ICE to the affected knee every three hours for 30 minutes at a time and then as needed for pain and swelling.  Continue to use ice on the knee for pain and swelling from surgery. You may notice swelling that will progress down to the foot and ankle.  This is normal after surgery.  Elevate the leg when you are not up walking on it.   It is important for you to complete the blood thinner medication as prescribed by your doctor. Continue to use the breathing machine which will help keep your temperature down.  It is common for your temperature to cycle up and down following surgery, especially at night when you are not up moving around and exerting yourself.  The breathing machine keeps your lungs expanded and your temperature down.  RANGE OF MOTION AND STRENGTHENING EXERCISES  Rehabilitation of the knee is important following a knee injury or an   operation. After just a few days of immobilization, the muscles of the thigh which control the knee become weakened and shrink (atrophy). Knee exercises are designed to build up the tone and strength of the thigh muscles and to improve knee motion. Often times heat used for twenty to thirty minutes before working out will loosen up your tissues and help with improving the range of motion but do not use heat for the first two weeks following surgery.  These exercises can be done on a training (exercise) mat, on the floor, on a table or on a bed. Use what ever works the best and is most comfortable for you Knee exercises include:  Leg Lifts - While your knee is still immobilized in a splint or cast, you can do straight leg raises. Lift the leg to 60 degrees, hold for 3 sec, and slowly lower the leg. Repeat 10-20 times 2-3 times daily. Perform this exercise against resistance later as your knee gets better.  Quad and Hamstring Sets - Tighten up the muscle on the front of the thigh (Quad) and hold for 5-10 sec. Repeat this 10-20 times hourly. Hamstring sets are done by pushing the foot backward against an object and holding for 5-10 sec. Repeat as with quad sets.  A rehabilitation program following serious knee injuries can speed recovery and prevent re-injury in the future due to weakened muscles. Contact your doctor or a physical therapist for more information on knee rehabilitation.   POST-OPERATIVE OPIOID TAPER INSTRUCTIONS: It is important to wean off of your opioid medication as soon as possible. If you do not need pain medication after your surgery it is ok to stop day one. Opioids include: Codeine, Hydrocodone(Norco, Vicodin), Oxycodone(Percocet, oxycontin) and hydromorphone amongst others.  Long term and even short term use of opiods can cause: Increased pain response Dependence Constipation Depression Respiratory depression And more.  Withdrawal symptoms can include Flu like symptoms Nausea, vomiting And more Techniques to manage these symptoms Hydrate well Eat regular healthy meals Stay active Use relaxation techniques(deep breathing, meditating, yoga) Do Not substitute Alcohol to help with tapering If you have been on opioids for less than two weeks and do not have pain than it is ok to stop all together.  Plan to wean off of opioids This plan should start within one week post op of your joint replacement. Maintain the same  interval or time between taking each dose and first decrease the dose.  Cut the total daily intake of opioids by one tablet each day Next start to increase the time between doses. The last dose that should be eliminated is the evening dose.    SKILLED REHAB INSTRUCTIONS: If the patient is transferred to a skilled rehab facility following release from the hospital, a list of the current medications will be sent to the facility for the patient to continue.  When discharged from the skilled rehab facility, please have the facility set up the patient's Home Health Physical Therapy prior to being released. Also, the skilled facility will be responsible for providing the patient with their medications at time of release from the facility to include their pain medication, the muscle relaxants, and their blood thinner medication. If the patient is still at the rehab facility at time of the two week follow up appointment, the skilled rehab facility will also need to assist the patient in arranging follow up appointment in our office and any transportation needs.  MAKE SURE YOU:  Understand these instructions.  Will watch   your condition.  Will get help right away if you are not doing well or get worse.    Pick up stool softner and laxative for home use following surgery while on pain medications. Do NOT remove your dressing. You may shower.  Do not take tub baths or submerge incision under water. May shower starting three days after surgery. Please use a clean towel to pat the incision dry following showers. Continue to use ice for pain and swelling after surgery. Do not use any lotions or creams on the incision until instructed by your surgeon.  

## 2021-04-26 NOTE — Transfer of Care (Signed)
Immediate Anesthesia Transfer of Care Note  Patient: Monique Wagner  Procedure(s) Performed: Procedure(s): COMPUTER ASSISTED TOTAL KNEE ARTHROPLASTY (Right)  Patient Location: PACU  Anesthesia Type:Spinal  Level of Consciousness: awake, alert  and oriented  Airway & Oxygen Therapy: Patient Spontanous Breathing  Post-op Assessment: Report given to RN and Post -op Vital signs reviewed and stable  Post vital signs: Reviewed and stable  Last Vitals:  Vitals:   04/26/21 0958 04/26/21 1000  BP:  (!) 141/69  Pulse: 73 71  Resp: 18 17  Temp:    SpO2: 100% 100%    Complications: No apparent anesthesia complications

## 2021-04-27 ENCOUNTER — Encounter (HOSPITAL_COMMUNITY): Payer: Self-pay | Admitting: Orthopedic Surgery

## 2021-04-27 DIAGNOSIS — M1711 Unilateral primary osteoarthritis, right knee: Secondary | ICD-10-CM | POA: Diagnosis not present

## 2021-04-27 LAB — CBC
HCT: 33.9 % — ABNORMAL LOW (ref 36.0–46.0)
Hemoglobin: 11.1 g/dL — ABNORMAL LOW (ref 12.0–15.0)
MCH: 30.7 pg (ref 26.0–34.0)
MCHC: 32.7 g/dL (ref 30.0–36.0)
MCV: 93.6 fL (ref 80.0–100.0)
Platelets: 236 10*3/uL (ref 150–400)
RBC: 3.62 MIL/uL — ABNORMAL LOW (ref 3.87–5.11)
RDW: 12.8 % (ref 11.5–15.5)
WBC: 15.8 10*3/uL — ABNORMAL HIGH (ref 4.0–10.5)
nRBC: 0 % (ref 0.0–0.2)

## 2021-04-27 LAB — BASIC METABOLIC PANEL
Anion gap: 7 (ref 5–15)
BUN: 16 mg/dL (ref 8–23)
CO2: 24 mmol/L (ref 22–32)
Calcium: 8.7 mg/dL — ABNORMAL LOW (ref 8.9–10.3)
Chloride: 108 mmol/L (ref 98–111)
Creatinine, Ser: 1.09 mg/dL — ABNORMAL HIGH (ref 0.44–1.00)
GFR, Estimated: 51 mL/min — ABNORMAL LOW (ref >60)
Glucose, Bld: 126 mg/dL — ABNORMAL HIGH (ref 70–99)
Potassium: 4.1 mmol/L (ref 3.5–5.1)
Sodium: 139 mmol/L (ref 135–145)

## 2021-04-27 MED ORDER — HYDROCODONE-ACETAMINOPHEN 5-325 MG PO TABS: 1.0000 | ORAL_TABLET | ORAL | 0 refills | Status: DC | PRN

## 2021-04-27 MED ORDER — ASPIRIN 81 MG PO CHEW: 81.0000 mg | CHEWABLE_TABLET | Freq: Two times a day (BID) | ORAL | 0 refills | Status: AC

## 2021-04-27 MED ORDER — DOCUSATE SODIUM 100 MG PO CAPS: 100.0000 mg | ORAL_CAPSULE | Freq: Two times a day (BID) | ORAL | 0 refills | Status: DC

## 2021-04-27 MED ORDER — SENNA 8.6 MG PO TABS: 1.0000 | ORAL_TABLET | Freq: Two times a day (BID) | ORAL | 0 refills | Status: DC

## 2021-04-27 MED ORDER — ONDANSETRON HCL 4 MG PO TABS: 4.0000 mg | ORAL_TABLET | Freq: Four times a day (QID) | ORAL | 0 refills | Status: DC | PRN

## 2021-04-27 MED ORDER — MELOXICAM 15 MG PO TABS: 15.0000 mg | ORAL_TABLET | Freq: Every day | ORAL | 0 refills | Status: AC

## 2021-04-27 NOTE — Progress Notes (Signed)
    Subjective:  Patient reports pain as mild.  Denies N/V/CP/SOB. No c/o  Objective:   VITALS:   Vitals:   04/26/21 2237 04/27/21 0120 04/27/21 0512 04/27/21 0951  BP:  (!) 105/59 (!) 104/49 110/62  Pulse:  74 73 75  Resp:  16 14 18   Temp:  98 F (36.7 C) 97.9 F (36.6 C) 98.8 F (37.1 C)  TempSrc:  Oral Oral   SpO2:  100% 100% 100%  Weight: 70.3 kg     Height: 5\' 4"  (1.626 m)       NAD ABD soft Sensation intact distally Intact pulses distally Dorsiflexion/Plantar flexion intact Incision: dressing C/D/I Compartment soft   Lab Results  Component Value Date   WBC 15.8 (H) 04/27/2021   HGB 11.1 (L) 04/27/2021   HCT 33.9 (L) 04/27/2021   MCV 93.6 04/27/2021   PLT 236 04/27/2021   BMET    Component Value Date/Time   NA 139 04/27/2021 0344   K 4.1 04/27/2021 0344   CL 108 04/27/2021 0344   CO2 24 04/27/2021 0344   GLUCOSE 126 (H) 04/27/2021 0344   BUN 16 04/27/2021 0344   CREATININE 1.09 (H) 04/27/2021 0344   CALCIUM 8.7 (L) 04/27/2021 0344   GFRNONAA 51 (L) 04/27/2021 0344   GFRAA 54 (L) 05/19/2020 0246     Assessment/Plan: 1 Day Post-Op   Principal Problem:   Osteoarthritis of right knee   WBAT with walker DVT ppx: Aspirin, SCDs, TEDS PO pain control PT/OT Dispo: D/C home after clears therapy with OPPT   04/29/2021 Loden Laurent 04/27/2021, 10:20 AM   Iline Oven, MD [phone] [facility] is now [facility]  [place] [address], [address], [place], [address] [place] Phone: [phone] [email or web address] Facebook  [phone]

## 2021-04-27 NOTE — Progress Notes (Signed)
Physical Therapy Treatment Patient Details Name: Monique Wagner MRN: [id number] DOB: Oct 07, 1940 Today's Date: 04/27/2021    History of Present Illness Patient is 80 y.o. female s/p Rt TKA on 04/26/21  with PMH significant for stroke, HLD, HTN, dyspnea, OA, Lt TKA on 05/18/20.    PT Comments    The patient is progressing very well. Ambulated x 200', no complaints of pain. Will see again this AM for HEP review. Plans DC today to sister's home.   Follow Up Recommendations  Follow surgeon's recommendation for DC plan and follow-up therapies     Equipment Recommendations  None recommended by PT    Recommendations for Other Services       Precautions / Restrictions Precautions Precautions: Fall;Knee    Mobility  Bed Mobility               General bed mobility comments: in recliner    Transfers   Equipment used: Rolling walker (2 wheeled) Transfers: Sit to/from Stand Sit to Stand: Supervision         General transfer comment: cues for hand placement/technique, right leg position prior to sitting  Ambulation/Gait Ambulation/Gait assistance: Supervision Gait Distance (Feet): 200 Feet Assistive device: Rolling walker (2 wheeled) Gait Pattern/deviations: Step-through pattern   Gait velocity interpretation: <1.31 ft/sec, indicative of household ambulator General Gait Details: cues for step through and not think too hard about sequence   Stairs             Wheelchair Mobility    Modified Rankin (Stroke Patients Only)       Balance     Sitting balance-Leahy Scale: Good       Standing balance-Leahy Scale: Fair                              Cognition Arousal/Alertness: Awake/alert Behavior During Therapy: WFL for tasks assessed/performed                                          Exercises Total Joint Exercises Quad Sets: AROM;Both;10 reps;Supine Straight Leg Raises: AROM;Right;10 reps;Supine Long Arc Quad:  AROM;Right;10 reps;Seated Knee Flexion: AROM;Right;10 reps;Seated Goniometric ROM: 0-80 right knee    General Comments General comments (skin integrity, edema, etc.): stands without RW support      Pertinent Vitals/Pain Pain Assessment: No/denies pain    Home Living                      Prior Function            PT Goals (current goals can now be found in the care plan section) Progress towards PT goals: Progressing toward goals    Frequency    7X/week      PT Plan Current plan remains appropriate    Co-evaluation              AM-PAC PT "6 Clicks" Mobility   Outcome Measure  Help needed turning from your back to your side while in a flat bed without using bedrails?: None Help needed moving from lying on your back to sitting on the side of a flat bed without using bedrails?: None Help needed moving to and from a bed to a chair (including a wheelchair)?: A Little Help needed standing up from a chair using your arms (e.g., wheelchair or bedside chair)?: A  Little Help needed to walk in hospital room?: A Little Help needed climbing 3-5 steps with a railing? : A Little 6 Click Score: 20    End of Session Equipment Utilized During Treatment: Gait belt Activity Tolerance: Patient tolerated treatment well Patient left: in chair;with call bell/phone within reach;with chair alarm set Nurse Communication: Mobility status PT Visit Diagnosis: Muscle weakness (generalized) (M62.81);Difficulty in walking, not elsewhere classified (R26.2)     Time: 8325-4982 PT Time Calculation (min) (ACUTE ONLY): 16 min  Charges:  $Gait Training: 8-22 mins                     Blanchard Kelch PT Acute Rehabilitation Services Pager [phone] Office [phone]    Rada Hay 04/27/2021, 8:33 AM

## 2021-04-27 NOTE — Progress Notes (Signed)
   04/27/21 1200  PT Visit Information  Last PT Received On 04/27/21  Assistance Needed +1  History of Present Illness Patient is 80 y.o. female s/p Rt TKA on 04/26/21  with PMH significant for stroke, HLD, HTN, dyspnea, OA, Lt TKA on 05/18/20.  Precautions  Precautions Fall;Knee  Cognition  Arousal/Alertness Awake/alert  Behavior During Therapy WFL for tasks assessed/performed  Bed Mobility  General bed mobility comments in recliner  Transfers  Equipment used Rolling walker (2 wheeled)  Transfers Sit to/from Stand  Sit to Stand Supervision  General transfer comment cues for hand placement/technique, right leg position prior to sitting  Ambulation/Gait  Ambulation/Gait assistance Supervision  Gait Distance (Feet) 200 Feet  Assistive device Rolling walker (2 wheeled)  Gait Pattern/deviations Step-through pattern  General Gait Details gait smoth and steady  PT - End of Session  Activity Tolerance Patient tolerated treatment well, ready for Dc, sister present. Reviewed activity at Dc and HEP.  Patient left in chair;with call bell/phone within reach;with chair alarm set  Nurse Communication Mobility status   PT - Assessment/Plan  PT Plan Current plan remains appropriate  PT Visit Diagnosis Muscle weakness (generalized) (M62.81);Difficulty in walking, not elsewhere classified (R26.2)  PT Frequency (ACUTE ONLY) 7X/week  Follow Up Recommendations Follow surgeon's recommendation for DC plan and follow-up therapies  PT equipment None recommended by PT  AM-PAC PT "6 Clicks" Mobility Outcome Measure (Version 2)  Help needed turning from your back to your side while in a flat bed without using bedrails? 4  Help needed moving from lying on your back to sitting on the side of a flat bed without using bedrails? 4  Help needed moving to and from a bed to a chair (including a wheelchair)? 3  Help needed standing up from a chair using your arms (e.g., wheelchair or bedside chair)? 3  Help needed to  walk in hospital room? 3  6 Click Score 17  Consider Recommendation of Discharge To: Home with [facility]  PT Goal Progression  Progress towards PT goals Progressing toward goals  PT Time Calculation  PT Start Time (ACUTE ONLY) 1142  PT Stop Time (ACUTE ONLY) 1208  PT Time Calculation (min) (ACUTE ONLY) 26 min  PT General Charges  $$ ACUTE PT VISIT 1 Visit  Blanchard Kelch PT Acute Rehabilitation Services Pager [phone] Office [phone]

## 2021-04-27 NOTE — Progress Notes (Signed)
Discharge package printed and instructions given to pt. Verbalizes understanding. 

## 2021-04-27 NOTE — Plan of Care (Signed)
  Problem: Pain Managment: Goal: General experience of comfort will improve Outcome: Progressing   Problem: Activity: Goal: Ability to avoid complications of mobility impairment will improve Outcome: Progressing Goal: Range of joint motion will improve Outcome: Progressing

## 2021-04-27 NOTE — TOC Transition Note (Signed)
Transition of Care [facility]) - CM/SW Discharge Note  Patient Details  Name: Monique Wagner MRN: [id number] Date of Birth: Feb 21, 1941  [facility] CM/SW Contact:  Sherie Don, LCSW Phone Number: 04/27/2021, 10:22 AM  Clinical Narrative: Patient is expected to discharge after working with PT. CSW met with patient to confirm discharge plan. Patient will go to [facility]. Patient has a rolling walker and cane at home, but she will be discharging to her sister's family care home which has all the DME she will need. TOC signing off.  Final next level of care: OP Rehab Barriers to Discharge: No Barriers Identified  Patient Goals and CMS Choice Patient states their goals for this hospitalization and ongoing recovery are:: Discharge to sister's house CMS Medicare.gov Compare Post Acute Care list provided to:: Patient Choice offered to / list presented to : NA  Discharge Plan and Services        DME Arranged: N/A DME Agency: NA  Readmission Risk Interventions No flowsheet data found.

## 2021-05-01 ENCOUNTER — Ambulatory Visit (HOSPITAL_COMMUNITY): Payer: Medicare Other | Attending: Student | Admitting: Physical Therapy

## 2021-05-01 ENCOUNTER — Other Ambulatory Visit: Payer: Self-pay | Admitting: Cardiology

## 2021-05-01 ENCOUNTER — Encounter (HOSPITAL_COMMUNITY): Payer: Self-pay | Admitting: Physical Therapy

## 2021-05-01 ENCOUNTER — Other Ambulatory Visit: Payer: Self-pay

## 2021-05-01 DIAGNOSIS — R262 Difficulty in walking, not elsewhere classified: Secondary | ICD-10-CM | POA: Diagnosis present

## 2021-05-01 DIAGNOSIS — G8929 Other chronic pain: Secondary | ICD-10-CM | POA: Diagnosis present

## 2021-05-01 DIAGNOSIS — M25662 Stiffness of left knee, not elsewhere classified: Secondary | ICD-10-CM | POA: Insufficient documentation

## 2021-05-01 DIAGNOSIS — M6281 Muscle weakness (generalized): Secondary | ICD-10-CM | POA: Diagnosis present

## 2021-05-01 DIAGNOSIS — M25561 Pain in right knee: Secondary | ICD-10-CM | POA: Diagnosis present

## 2021-05-01 NOTE — Therapy (Signed)
[facility] [address] [place], [place], [address] Phone: [phone]   Fax:  [phone]  Physical Therapy Evaluation  Patient Details  Name: Monique Wagner MRN: [id number] Date of Birth: 1940-10-04 Referring Provider (PT): MDBrian Linna CapriceSwinteck, MD   Encounter Date: 05/01/2021   PT End of Session - 05/01/21 0909     Visit Number 1    Number of Visits 18    Date for PT Re-Evaluation 06/12/21    Authorization Type BCBS medicare - no ath or VL    Progress Note Due on Visit 10    PT Start Time 0911    PT Stop Time 0945    PT Time Calculation (min) 34 min             Past Medical History:  Diagnosis Date   Anemia    Arthritis    Diverticulitis    Dyspnea    HTN (hypertension)    Hyperlipidemia    Stroke (HCC)    slight stroke 50 years ago - no deficits    UTI (urinary tract infection) 12/14/2020    Past Surgical History:  Procedure Laterality Date   ABDOMINAL HYSTERECTOMY     BREAST SURGERY     KNEE ARTHROPLASTY Left 05/18/2020   Procedure: COMPUTER ASSISTED TOTAL KNEE ARTHROPLASTY;  Surgeon: Samson FredericSwinteck, Brian, MD;  Location: [facility];  Service: Orthopedics;  Laterality: Left;   KNEE ARTHROPLASTY Right 04/26/2021   Procedure: COMPUTER ASSISTED TOTAL KNEE ARTHROPLASTY;  Surgeon: Samson FredericSwinteck, Brian, MD;  Location: [facility];  Service: Orthopedics;  Laterality: Right;    There were no vitals filed for this visit.    Subjective Assessment - 05/01/21 0921     Subjective States that prior to her surgery she had pain with walking and was using a cane for her right knee. States that she felt her knee was bow legged and now it is straight. States that she had a knee replacement on her right knee on 04/26/21 and reports 8/10 stiffness in knee. States she is not having that much pain an dis only taking her pain meds if needed. Currently she is staying with her sister until she is safe to return to her home.    Pertinent History left TKA, HTN, hx  of CVA    Limitations House hold activities;Walking;Standing    Patient Stated Goals to have less pain    Currently in Pain? Yes    Pain Score 8     Pain Location Knee    Pain Orientation Right    Pain Descriptors / Indicators Tightness    Pain Type Surgical pain    Pain Radiating Towards around knee    Pain Onset More than a month ago    Pain Frequency Intermittent    Aggravating Factors  standing, walking, bending    Pain Relieving Factors ice, medication                [facility] PT Assessment - 05/01/21 0001       Assessment   Medical Diagnosis R TKA    Referring Provider (PT) MDBrian Swinteck, MD    Onset Date/Surgical Date 04/26/21    Next MD Visit 2 weeks post op    Prior Therapy yes for left knee replacement      Balance Screen   Has the patient fallen in the past 6 months No      Home Environment   Living Environment Private residence    Living Arrangements Alone  Available Help at Discharge Family    Type of Home House    Home Access Stairs to enter    Entrance Stairs-Number of Steps ramp    Home Layout One level    Home Equipment Walker - 2 wheels      Prior Function   Level of Independence Independent with community mobility with device   used cane as needed for right knee     Cognition   Overall Cognitive Status Within Functional Limits for tasks assessed      Observation/Other Assessments   Observations ace bandage in place with cotton - once removed - bandage in place -  no signs of infection, swelling and bruising throughout right leg    Focus on Therapeutic Outcomes (FOTO)  27.7% function      ROM / Strength   AROM / PROM / Strength AROM;Strength      AROM   AROM Assessment Site Knee    Right/Left Knee Left;Right    Right Knee Extension 1    Right Knee Flexion 105    Left Knee Extension 11   lacking   Left Knee Flexion 68   --> 74  after ROM     Strength   Overall Strength Comments fair quad isometric on right with tactile cues       Transfers   Transfers Sit to Stand;Stand to Sit    Sit to Stand 6: Modified independent (Device/Increase time);With upper extremity assist   with right leg kicked out   Stand to Sit 6: Modified independent (Device/Increase time);With upper extremity assist   with right leg kicked out     Ambulation/Gait   Ambulation/Gait Yes    Ambulation/Gait Assistance 6: Modified independent (Device/Increase time)    Ambulation Distance (Feet) 148 Feet    Assistive device Rolling walker    Gait Pattern Decreased hip/knee flexion - right;Decreased step length - right;Decreased step length - left;Decreased stance time - right;Decreased stance time - left;Decreased stride length;Left foot flat;Right foot flat    Ambulation Surface Level;Unlevel    Gait Comments                        Objective measurements completed on examination: See above findings.       [facility] Adult PT Treatment/Exercise - 05/01/21 0001       Exercises   Exercises Knee/Hip      Knee/Hip Exercises: Seated   Long Arc Quad 10 reps;Right      Knee/Hip Exercises: Supine   Quad Sets Right;15 reps   5" holds   Heel Slides AROM;Right;15 reps   10 seconds                   PT Education - 05/01/21 0944     Education Details on current presentation, POC, HEP and elevation and ROM exercises, on elevation    Person(s) Educated Patient    Methods Explanation    Comprehension Verbalized understanding              PT Short Term Goals - 05/01/21 3383       PT SHORT TERM GOAL #1   Title Patient will be independent in self management strategies to improve quality of life and functional outcomes.    Time 3    Period Weeks    Status New    Target Date 05/22/21      PT SHORT TERM GOAL #2   Title Patient will be able  to ambulate 226 feet in 2 minutes without AD to demonstrate improved walking mechanics    Time 3    Period Weeks    Status New    Target Date 05/22/21      PT SHORT TERM GOAL #3    Title Patient wil be able to transition from sit <-> stand without UE assist    Time 3    Period Weeks    Status New    Target Date 05/22/21               PT Long Term Goals - 05/01/21 0938       PT LONG TERM GOAL #1   Title Patient will demonstrate at least 0-105 degrees on right LE    Time 6    Period Weeks    Status New    Target Date 06/12/21      PT LONG TERM GOAL #2   Title Patient will improve on FOTO score to meet predicted outcomes to demonstrate improved functional mobility.    Time 6    Period Weeks    Status New    Target Date 06/12/21      PT LONG TERM GOAL #3   Title Patient will report at least 50% improvement in overall symptoms and/or function to demonstrate improved functional mobility    Time 6    Period Weeks    Status New    Target Date 06/12/21                    Plan - 05/01/21 0945     Clinical Impression Statement Patient is an 80 y.o. female who presents to physical therapy with complaint of right knee pain after right TKA on 04/26/21.  Patient demonstrates decreased strength, ROM restriction, balance deficits and gait abnormalities which are likely contributing to symptoms of pain and are negatively impacting patient ability to perform ADLs and functional mobility tasks. Patient will benefit from skilled physical therapy services to address these deficits to reduce pain, improve level of function with ADLs, functional mobility tasks, and reduce risk for falls.    Personal Factors and Comorbidities Comorbidity 1;Comorbidity 2    Comorbidities L TKA, CVA    Examination-Activity Limitations Locomotion Level;Dressing;Transfers;Stand;Squat;Bed Mobility;Sleep;Sit;Hygiene/Grooming;Lift    Examination-Participation Restrictions Cleaning;Church;Driving;Community Activity;Meal Prep    Stability/Clinical Decision Making Stable/Uncomplicated    Clinical Decision Making Low    Rehab Potential Good    PT Frequency 3x / week    PT Duration 6  weeks    PT Treatment/Interventions ADLs/Self Care Home Management;Aquatic Therapy;Cryotherapy;Electrical Stimulation;Moist Heat;Therapeutic exercise;Manual techniques;Therapeutic activities;Functional mobility training;Stair training;Gait training;DME Instruction;Neuromuscular re-education;Patient/family education;Dry needling;Passive range of motion;Joint Manipulations    PT Next Visit Plan knee ROM, quad activation, edema management    PT Home Exercise Plan heel slides, quad sets, elevation    Consulted and Agree with Plan of Care Patient             Patient will benefit from skilled therapeutic intervention in order to improve the following deficits and impairments:  Decreased endurance, Decreased skin integrity, Increased edema, Decreased scar mobility, Decreased knowledge of use of DME, Decreased strength, Pain, Decreased mobility, Difficulty walking, Decreased range of motion  Visit Diagnosis: Chronic pain of right knee  Difficulty in walking, not elsewhere classified  Muscle weakness (generalized)     Problem List Patient Active Problem List   Diagnosis Date Noted   Osteoarthritis of right knee 04/26/2021   Pre-op evaluation 04/21/2021   Yeast  vaginitis 03/23/2021   Vaginal discharge 03/23/2021   Osteoarthritis of left knee 05/18/2020   Anemia    Lower abdominal pain    Diverticulitis 08/28/2017   Pain in the chest    Chest pain 09/03/2014   HTN (hypertension) 09/03/2014   Mixed hyperlipidemia 09/03/2014   Pain in joint, shoulder region 07/10/2012   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   Osteoarthrosis, unspecified whether generalized or localized, lower leg 04/21/2008   KNEE PAIN 04/21/2008   HIGH BLOOD PRESSURE 04/20/2008   9:50 AM, 05/01/21 Tereasa Coop, DPT Physical Therapy with [facility]  [phone] office   [facility] [address] [place], [place], [address] Phone: [phone]    Fax:  [phone]  Name: Monique Wagner MRN: [id number] Date of Birth: May 15, 1941

## 2021-05-03 ENCOUNTER — Encounter (HOSPITAL_COMMUNITY): Payer: Self-pay

## 2021-05-03 ENCOUNTER — Other Ambulatory Visit: Payer: Self-pay

## 2021-05-03 ENCOUNTER — Ambulatory Visit (HOSPITAL_COMMUNITY): Payer: Medicare Other

## 2021-05-03 DIAGNOSIS — R262 Difficulty in walking, not elsewhere classified: Secondary | ICD-10-CM

## 2021-05-03 DIAGNOSIS — M6281 Muscle weakness (generalized): Secondary | ICD-10-CM

## 2021-05-03 DIAGNOSIS — M25561 Pain in right knee: Secondary | ICD-10-CM | POA: Diagnosis not present

## 2021-05-03 DIAGNOSIS — G8929 Other chronic pain: Secondary | ICD-10-CM

## 2021-05-03 NOTE — Therapy (Signed)
[facility] [address] [place], [place], [address] Phone: [phone]   Fax:  [phone]  Physical Therapy Treatment  Patient Details  Name: Monique Wagner MRN: [id number] Date of Birth: September 08, 1941 Referring Provider (PT): MDBrian Swinteck, MD   Encounter Date: 05/03/2021   PT End of Session - 05/03/21 0834     Visit Number 2    Number of Visits 18    Date for PT Re-Evaluation 06/12/21    Authorization Type BCBS medicare - no ath or VL    Progress Note Due on Visit 10    PT Start Time 0830    PT Stop Time 0914    PT Time Calculation (min) 44 min    Activity Tolerance Patient tolerated treatment well    Behavior During Therapy [facility] for tasks assessed/performed             Past Medical History:  Diagnosis Date   Anemia    Arthritis    Diverticulitis    Dyspnea    HTN (hypertension)    Hyperlipidemia    Stroke (HCC)    slight stroke 50 years ago - no deficits    UTI (urinary tract infection) 12/14/2020    Past Surgical History:  Procedure Laterality Date   ABDOMINAL HYSTERECTOMY     BREAST SURGERY     KNEE ARTHROPLASTY Left 05/18/2020   Procedure: COMPUTER ASSISTED TOTAL KNEE ARTHROPLASTY;  Surgeon: Samson Frederic, MD;  Location: [facility];  Service: Orthopedics;  Laterality: Left;   KNEE ARTHROPLASTY Right 04/26/2021   Procedure: COMPUTER ASSISTED TOTAL KNEE ARTHROPLASTY;  Surgeon: Samson Frederic, MD;  Location: [facility];  Service: Orthopedics;  Laterality: Right;    There were no vitals filed for this visit.   Subjective Assessment - 05/03/21 0833     Subjective Pt stated she has no pain just some stiffness.  Reports numbness down Rt LE.  Stated she had some pain at night.  Has began exercises at home daily wihtout questions.    Pertinent History left TKA, HTN, hx of CVA    Patient Stated Goals to have less pain    Currently in Pain? No/denies                               [facility] Adult PT  Treatment/Exercise - 05/03/21 0001       Ambulation/Gait   Ambulation/Gait Yes    Ambulation/Gait Assistance 6: Modified independent (Device/Increase time)    Ambulation Distance (Feet) 226 Feet    Assistive device Rolling walker    Gait Pattern Decreased hip/knee flexion - right;Decreased step length - right;Decreased step length - left;Decreased stance time - right;Decreased stance time - left;Decreased stride length;Left foot flat;Right foot flat    Ambulation Surface Level;Indoor    Pre-Gait Activities cueing for heel strike and toe push off to improve gait mechanics      Exercises   Exercises Knee/Hip      Knee/Hip Exercises: Stretches   Knee: Self-Stretch to increase Flexion Right;5 reps;10 seconds    Knee: Self-Stretch Limitations 2nd step 14in height      Knee/Hip Exercises: Standing   Rocker Board 2 minutes    Rocker Board Limitations lateral      Knee/Hip Exercises: Seated   Long Arc Quad 10 reps;Right;2 sets    Heel Slides 10 reps      Knee/Hip Exercises: Supine   Quad Sets Right;2 sets;10 reps  Short Arc [employer] 10 reps    Heel Slides AROM;Right   2 min duration wiht good hold each direction   Knee Extension AROM    Knee Extension Limitations 6    Knee Flexion AROM    Knee Flexion Limitations 85      Manual Therapy   Manual Therapy Edema management    Manual therapy comments Manual complete separate rest of tx    Edema Management Retrograde massage with LE elevated                    PT Education - 05/03/21 0844     Education Details Reviewed goals, educated importance of compliance wiht HEP for maximal benefits, importance of ROM to improve gait and comfort sitting/stairs, RICE for pain and edema, gait training    Person(s) Educated Patient    Methods Explanation;Demonstration    Comprehension Verbalized understanding;Returned demonstration              PT Short Term Goals - 05/01/21 0938       PT SHORT TERM GOAL #1   Title  Patient will be independent in self management strategies to improve quality of life and functional outcomes.    Time 3    Period Weeks    Status New    Target Date 05/22/21      PT SHORT TERM GOAL #2   Title Patient will be able to ambulate 226 feet in 2 minutes without AD to demonstrate improved walking mechanics    Time 3    Period Weeks    Status New    Target Date 05/22/21      PT SHORT TERM GOAL #3   Title Patient wil be able to transition from sit <-> stand without UE assist    Time 3    Period Weeks    Status New    Target Date 05/22/21               PT Long Term Goals - 05/01/21 0938       PT LONG TERM GOAL #1   Title Patient will demonstrate at least 0-105 degrees on right LE    Time 6    Period Weeks    Status New    Target Date 06/12/21      PT LONG TERM GOAL #2   Title Patient will improve on FOTO score to meet predicted outcomes to demonstrate improved functional mobility.    Time 6    Period Weeks    Status New    Target Date 06/12/21      PT LONG TERM GOAL #3   Title Patient will report at least 50% improvement in overall symptoms and/or function to demonstrate improved functional mobility    Time 6    Period Weeks    Status New    Target Date 06/12/21                   Plan - 05/03/21 0849     Clinical Impression Statement Reviewed goals, educated importance of HEP compliance for maximal benefits, pt able to recall and demonstrate appropriate mechanics with current exercise program.  Gait training to improve heel strike and stance phase with RW. Reviewed RICE techniques for pain and edema control.  Session focus on ROM based exercises.  Pt able to demonstrate good mechanics with all exercises.  EOS with manual retrograde for edema control.  Improved AROM 6-85 degrees,    Personal Factors  and Comorbidities Comorbidity 1;Comorbidity 2    Comorbidities L TKA, CVA    Examination-Activity Limitations Locomotion  Level;Dressing;Transfers;Stand;Squat;Bed Mobility;Sleep;Sit;Hygiene/Grooming;Lift    Examination-Participation Restrictions Cleaning;Church;Driving;Community Activity;Meal Prep    Stability/Clinical Decision Making Stable/Uncomplicated    Clinical Decision Making Low    Rehab Potential Good    PT Frequency 3x / week    PT Duration 6 weeks    PT Treatment/Interventions ADLs/Self Care Home Management;Aquatic Therapy;Cryotherapy;Electrical Stimulation;Moist Heat;Therapeutic exercise;Manual techniques;Therapeutic activities;Functional mobility training;Stair training;Gait training;DME Instruction;Neuromuscular re-education;Patient/family education;Dry needling;Passive range of motion;Joint Manipulations    PT Next Visit Plan knee ROM, quad activation, edema management    PT Home Exercise Plan heel slides, quad sets, elevation    Consulted and Agree with Plan of Care Patient             Patient will benefit from skilled therapeutic intervention in order to improve the following deficits and impairments:  Decreased endurance, Decreased skin integrity, Increased edema, Decreased scar mobility, Decreased knowledge of use of DME, Decreased strength, Pain, Decreased mobility, Difficulty walking, Decreased range of motion  Visit Diagnosis: Difficulty in walking, not elsewhere classified  Muscle weakness (generalized)  Chronic pain of right knee     Problem List Patient Active Problem List   Diagnosis Date Noted   Osteoarthritis of right knee 04/26/2021   Pre-op evaluation 04/21/2021   Yeast vaginitis 03/23/2021   Vaginal discharge 03/23/2021   Osteoarthritis of left knee 05/18/2020   Anemia    Lower abdominal pain    Diverticulitis 08/28/2017   Pain in the chest    Chest pain 09/03/2014   HTN (hypertension) 09/03/2014   Mixed hyperlipidemia 09/03/2014   Pain in joint, shoulder region 07/10/2012   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   Osteoarthrosis, unspecified whether  generalized or localized, lower leg 04/21/2008   KNEE PAIN 04/21/2008   HIGH BLOOD PRESSURE 04/20/2008   Becky Sax, LPTA/CLT; CBIS [phone]  Juel Burrow 05/03/2021, 9:29 AM  [facility] [address] [place], [place], [address] Phone: [phone]   Fax:  [phone]  Name: CARRIANN HESSE MRN: [id number] Date of Birth: 1941/03/13

## 2021-05-05 ENCOUNTER — Encounter (HOSPITAL_COMMUNITY): Payer: Self-pay | Admitting: Physical Therapy

## 2021-05-05 ENCOUNTER — Other Ambulatory Visit: Payer: Self-pay

## 2021-05-05 ENCOUNTER — Ambulatory Visit (HOSPITAL_COMMUNITY): Payer: Medicare Other | Admitting: Physical Therapy

## 2021-05-05 DIAGNOSIS — M25561 Pain in right knee: Secondary | ICD-10-CM | POA: Diagnosis not present

## 2021-05-05 DIAGNOSIS — R262 Difficulty in walking, not elsewhere classified: Secondary | ICD-10-CM

## 2021-05-05 DIAGNOSIS — M6281 Muscle weakness (generalized): Secondary | ICD-10-CM

## 2021-05-05 DIAGNOSIS — G8929 Other chronic pain: Secondary | ICD-10-CM

## 2021-05-05 NOTE — Therapy (Signed)
[facility] [address] [place], [place], [address] Phone: [phone]   Fax:  [phone]  Physical Therapy Treatment  Patient Details  Name: Monique Wagner MRN: [id number] Date of Birth: 15-Oct-1940 Referring Provider (PT): MDBrian Swinteck, MD   Encounter Date: 05/05/2021   PT End of Session - 05/05/21 0817     Visit Number 3    Number of Visits 18    Date for PT Re-Evaluation 06/12/21    Authorization Type BCBS medicare - no ath or VL    Progress Note Due on Visit 10    PT Start Time 0818    PT Stop Time 0859    PT Time Calculation (min) 41 min    Activity Tolerance Patient tolerated treatment well    Behavior During Therapy [facility] for tasks assessed/performed             Past Medical History:  Diagnosis Date   Anemia    Arthritis    Diverticulitis    Dyspnea    HTN (hypertension)    Hyperlipidemia    Stroke (HCC)    slight stroke 50 years ago - no deficits    UTI (urinary tract infection) 12/14/2020    Past Surgical History:  Procedure Laterality Date   ABDOMINAL HYSTERECTOMY     BREAST SURGERY     KNEE ARTHROPLASTY Left 05/18/2020   Procedure: COMPUTER ASSISTED TOTAL KNEE ARTHROPLASTY;  Surgeon: Samson Frederic, MD;  Location: [facility];  Service: Orthopedics;  Laterality: Left;   KNEE ARTHROPLASTY Right 04/26/2021   Procedure: COMPUTER ASSISTED TOTAL KNEE ARTHROPLASTY;  Surgeon: Samson Frederic, MD;  Location: [facility];  Service: Orthopedics;  Laterality: Right;    There were no vitals filed for this visit.   Subjective Assessment - 05/05/21 0819     Subjective Patient with some numbness around knee. Her home exercises are going well. She walked the driveway twice yesterday.    Pertinent History left TKA, HTN, hx of CVA    Patient Stated Goals to have less pain    Currently in Pain? No/denies                               [facility] Adult PT Treatment/Exercise - 05/05/21 0001       Knee/Hip  Exercises: Stretches   Gastroc Stretch Both;3 reps;30 seconds    Gastroc Stretch Limitations slant board      Knee/Hip Exercises: Aerobic   Recumbent Bike 5 minutes rocking for dynamic warm up and ROM      Knee/Hip Exercises: Standing   Heel Raises Both;10 reps      Knee/Hip Exercises: Seated   Long Arc Quad 10 reps;Right    Long Arc Quad Limitations 5 second holds      Knee/Hip Exercises: Supine   Advice worker Limitations 10 second hold    Heel Slides Right;10 reps    Heel Slides Limitations 10 second holds    Straight Leg Raises Right;2 sets;10 reps    Knee Extension AROM    Knee Extension Limitations 4    Knee Flexion AROM    Knee Flexion Limitations 90   improves to 96 following heel slides   Other Supine Knee/Hip Exercises hamstring isometric into green ball 10x 5 second holds                    PT Education -  05/05/21 0819     Education Details HEP, elevation    Person(s) Educated Patient    Methods Explanation;Handout    Comprehension Verbalized understanding              PT Short Term Goals - 05/01/21 0938       PT SHORT TERM GOAL #1   Title Patient will be independent in self management strategies to improve quality of life and functional outcomes.    Time 3    Period Weeks    Status New    Target Date 05/22/21      PT SHORT TERM GOAL #2   Title Patient will be able to ambulate 226 feet in 2 minutes without AD to demonstrate improved walking mechanics    Time 3    Period Weeks    Status New    Target Date 05/22/21      PT SHORT TERM GOAL #3   Title Patient wil be able to transition from sit <-> stand without UE assist    Time 3    Period Weeks    Status New    Target Date 05/22/21               PT Long Term Goals - 05/01/21 0938       PT LONG TERM GOAL #1   Title Patient will demonstrate at least 0-105 degrees on right LE    Time 6    Period Weeks    Status New    Target Date 06/12/21      PT  LONG TERM GOAL #2   Title Patient will improve on FOTO score to meet predicted outcomes to demonstrate improved functional mobility.    Time 6    Period Weeks    Status New    Target Date 06/12/21      PT LONG TERM GOAL #3   Title Patient will report at least 50% improvement in overall symptoms and/or function to demonstrate improved functional mobility    Time 6    Period Weeks    Status New    Target Date 06/12/21                   Plan - 05/05/21 0819     Clinical Impression Statement Began session with bike for dynamic war up and ROM. Patient demonstrating improving ROM following from lacking 4 to 90 degrees. She demonstrates minimal quad lag with SLR.  ROM improves to 96 following heel slides. Patient progressing well. Patient will continue to benefit from skilled physical therapy in order to reduce impairment and improve function.    Personal Factors and Comorbidities Comorbidity 1;Comorbidity 2    Comorbidities L TKA, CVA    Examination-Activity Limitations Locomotion Level;Dressing;Transfers;Stand;Squat;Bed Mobility;Sleep;Sit;Hygiene/Grooming;Lift    Examination-Participation Restrictions Cleaning;Church;Driving;Community Activity;Meal Prep    Stability/Clinical Decision Making Stable/Uncomplicated    Rehab Potential Good    PT Frequency 3x / week    PT Duration 6 weeks    PT Treatment/Interventions ADLs/Self Care Home Management;Aquatic Therapy;Cryotherapy;Electrical Stimulation;Moist Heat;Therapeutic exercise;Manual techniques;Therapeutic activities;Functional mobility training;Stair training;Gait training;DME Instruction;Neuromuscular re-education;Patient/family education;Dry needling;Passive range of motion;Joint Manipulations    PT Next Visit Plan knee ROM, quad activation, edema management    PT Home Exercise Plan heel slides, quad sets, elevation 8/5 SLR, heel raise    Consulted and Agree with Plan of Care Patient             Patient will benefit from  skilled therapeutic intervention in order to  improve the following deficits and impairments:  Decreased endurance, Decreased skin integrity, Increased edema, Decreased scar mobility, Decreased knowledge of use of DME, Decreased strength, Pain, Decreased mobility, Difficulty walking, Decreased range of motion  Visit Diagnosis: Difficulty in walking, not elsewhere classified  Muscle weakness (generalized)  Chronic pain of right knee     Problem List Patient Active Problem List   Diagnosis Date Noted   Osteoarthritis of right knee 04/26/2021   Pre-op evaluation 04/21/2021   Yeast vaginitis 03/23/2021   Vaginal discharge 03/23/2021   Osteoarthritis of left knee 05/18/2020   Anemia    Lower abdominal pain    Diverticulitis 08/28/2017   Pain in the chest    Chest pain 09/03/2014   HTN (hypertension) 09/03/2014   Mixed hyperlipidemia 09/03/2014   Pain in joint, shoulder region 07/10/2012   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   Osteoarthrosis, unspecified whether generalized or localized, lower leg 04/21/2008   KNEE PAIN 04/21/2008   HIGH BLOOD PRESSURE 04/20/2008    9:03 AM, 05/05/21 Wyman Songster PT, DPT Physical Therapist at [facility]    [facility] [address] [place], [place], [address] Phone: [phone]   Fax:  [phone]  Name: Monique Wagner MRN: [id number] Date of Birth: 1941/05/27

## 2021-05-05 NOTE — Patient Instructions (Signed)
Access Code: [facility] URL: [email or web address] Date: 05/05/2021 Prepared by: Greig Castilla Josmar Messimer  Exercises Heel Raises with Counter Support - 2 x daily - 7 x weekly - 2 sets - 10 reps Supine Straight Leg Raises (Mirrored) - 1 x daily - 7 x weekly - 2 sets - 10 reps

## 2021-05-08 ENCOUNTER — Encounter (HOSPITAL_COMMUNITY): Payer: Self-pay | Admitting: Physical Therapy

## 2021-05-08 ENCOUNTER — Ambulatory Visit (HOSPITAL_COMMUNITY): Payer: Medicare Other | Admitting: Physical Therapy

## 2021-05-08 ENCOUNTER — Other Ambulatory Visit: Payer: Self-pay

## 2021-05-08 DIAGNOSIS — M25561 Pain in right knee: Secondary | ICD-10-CM

## 2021-05-08 DIAGNOSIS — M25662 Stiffness of left knee, not elsewhere classified: Secondary | ICD-10-CM

## 2021-05-08 DIAGNOSIS — M6281 Muscle weakness (generalized): Secondary | ICD-10-CM

## 2021-05-08 DIAGNOSIS — G8929 Other chronic pain: Secondary | ICD-10-CM

## 2021-05-08 DIAGNOSIS — R262 Difficulty in walking, not elsewhere classified: Secondary | ICD-10-CM

## 2021-05-08 NOTE — Therapy (Signed)
[facility] [address] [place], [place], [address] Phone: [phone]   Fax:  [phone]  Physical Therapy Treatment  Patient Details  Name: Monique Wagner MRN: [id number] Date of Birth: 04/07/1941 Referring Provider (PT): MDBrian Swinteck, MD   Encounter Date: 05/08/2021   PT End of Session - 05/08/21 7169     Visit Number 4    Number of Visits 18    Date for PT Re-Evaluation 06/12/21    Authorization Type BCBS medicare - no ath or VL    Progress Note Due on Visit 10    PT Start Time 0916    PT Stop Time 0955    PT Time Calculation (min) 39 min    Activity Tolerance Patient tolerated treatment well    Behavior During Therapy [facility] for tasks assessed/performed             Past Medical History:  Diagnosis Date   Anemia    Arthritis    Diverticulitis    Dyspnea    HTN (hypertension)    Hyperlipidemia    Stroke (HCC)    slight stroke 50 years ago - no deficits    UTI (urinary tract infection) 12/14/2020    Past Surgical History:  Procedure Laterality Date   ABDOMINAL HYSTERECTOMY     BREAST SURGERY     KNEE ARTHROPLASTY Left 05/18/2020   Procedure: COMPUTER ASSISTED TOTAL KNEE ARTHROPLASTY;  Surgeon: Samson Frederic, MD;  Location: [facility];  Service: Orthopedics;  Laterality: Left;   KNEE ARTHROPLASTY Right 04/26/2021   Procedure: COMPUTER ASSISTED TOTAL KNEE ARTHROPLASTY;  Surgeon: Samson Frederic, MD;  Location: [facility];  Service: Orthopedics;  Laterality: Right;    There were no vitals filed for this visit.   Subjective Assessment - 05/08/21 0923     Subjective Reports she stil has some soreness and swelling throughout her leg    Pertinent History left TKA, HTN, hx of CVA    Patient Stated Goals to have less pain    Currently in Pain? Yes    Pain Score 4     Pain Location Knee    Pain Orientation Right    Pain Descriptors / Indicators Tightness                [facility] PT Assessment - 05/08/21 0001        Assessment   Medical Diagnosis R TKA    Referring Provider (PT) MDBrian Swinteck, MD                           [facility] Adult PT Treatment/Exercise - 05/08/21 0001       Knee/Hip Exercises: Aerobic   Stationary Bike 6 minutes - tactile and verbal cues - rocking back and forth on seat 10      Knee/Hip Exercises: Supine   Heel Slides Right;10 reps    Heel Slides Limitations 10 second holds    Terminal Knee Extension 3 sets    Straight Leg Raises PROM;Right;3 sets;10 reps    Other Supine Knee/Hip Exercises hamstring curls with holds in extensionand flexion - 5 minutes on ball      Manual Therapy   Manual Therapy Edema management;Joint mobilization    Manual therapy comments Manual complete separate rest of tx    Edema Management Retrograde massage with R LE elevated    Joint Mobilization patella mobilizations in all directions  PT Education - 05/08/21 [phone]     Education Details on compression garments, swelling management and current presention, compression measurements    Person(s) Educated Patient    Methods Explanation    Comprehension Verbalized understanding              PT Short Term Goals - 05/01/21 5409       PT SHORT TERM GOAL #1   Title Patient will be independent in self management strategies to improve quality of life and functional outcomes.    Time 3    Period Weeks    Status New    Target Date 05/22/21      PT SHORT TERM GOAL #2   Title Patient will be able to ambulate 226 feet in 2 minutes without AD to demonstrate improved walking mechanics    Time 3    Period Weeks    Status New    Target Date 05/22/21      PT SHORT TERM GOAL #3   Title Patient wil be able to transition from sit <-> stand without UE assist    Time 3    Period Weeks    Status New    Target Date 05/22/21               PT Long Term Goals - 05/01/21 0938       PT LONG TERM GOAL #1   Title Patient will demonstrate at least  0-105 degrees on right LE    Time 6    Period Weeks    Status New    Target Date 06/12/21      PT LONG TERM GOAL #2   Title Patient will improve on FOTO score to meet predicted outcomes to demonstrate improved functional mobility.    Time 6    Period Weeks    Status New    Target Date 06/12/21      PT LONG TERM GOAL #3   Title Patient will report at least 50% improvement in overall symptoms and/or function to demonstrate improved functional mobility    Time 6    Period Weeks    Status New    Target Date 06/12/21                   Plan - 05/08/21 8119     Clinical Impression Statement Session focused on edema management and education. Patient with swelling throughout right leg ranging from .5 inches difference from left leg to 2.75 inches of difference. Educated patient on use of compression garments and continued elevation to help with swelling. Reduced pain noted with edema massage. Will follow up with compression garments next session. Will continue with current POC as tolerated.    Personal Factors and Comorbidities Comorbidity 1;Comorbidity 2    Comorbidities L TKA, CVA    Examination-Activity Limitations Locomotion Level;Dressing;Transfers;Stand;Squat;Bed Mobility;Sleep;Sit;Hygiene/Grooming;Lift    Examination-Participation Restrictions Cleaning;Church;Driving;Community Activity;Meal Prep    Stability/Clinical Decision Making Stable/Uncomplicated    Rehab Potential Good    PT Frequency 3x / week    PT Duration 6 weeks    PT Treatment/Interventions ADLs/Self Care Home Management;Aquatic Therapy;Cryotherapy;Electrical Stimulation;Moist Heat;Therapeutic exercise;Manual techniques;Therapeutic activities;Functional mobility training;Stair training;Gait training;DME Instruction;Neuromuscular re-education;Patient/family education;Dry needling;Passive range of motion;Joint Manipulations    PT Next Visit Plan f/u with compression garments. knee ROM, quad activation, edema  management    PT Home Exercise Plan heel slides, quad sets, elevation 8/5 SLR, heel raise    Consulted and Agree with Plan of Care Patient  Patient will benefit from skilled therapeutic intervention in order to improve the following deficits and impairments:  Decreased endurance, Decreased skin integrity, Increased edema, Decreased scar mobility, Decreased knowledge of use of DME, Decreased strength, Pain, Decreased mobility, Difficulty walking, Decreased range of motion  Visit Diagnosis: Difficulty in walking, not elsewhere classified  Muscle weakness (generalized)  Chronic pain of right knee  Decreased range of motion (ROM) of left knee     Problem List Patient Active Problem List   Diagnosis Date Noted   Osteoarthritis of right knee 04/26/2021   Pre-op evaluation 04/21/2021   Yeast vaginitis 03/23/2021   Vaginal discharge 03/23/2021   Osteoarthritis of left knee 05/18/2020   Anemia    Lower abdominal pain    Diverticulitis 08/28/2017   Pain in the chest    Chest pain 09/03/2014   HTN (hypertension) 09/03/2014   Mixed hyperlipidemia 09/03/2014   Pain in joint, shoulder region 07/10/2012   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   Osteoarthrosis, unspecified whether generalized or localized, lower leg 04/21/2008   KNEE PAIN 04/21/2008   HIGH BLOOD PRESSURE 04/20/2008    9:57 AM, 05/08/21 Tereasa Coop, DPT Physical Therapy with [facility]  [phone] office   [facility] [address] [place], [place], [address] Phone: [phone]   Fax:  [phone]  Name: Monique Wagner MRN: [id number] Date of Birth: 06-25-1941

## 2021-05-10 ENCOUNTER — Ambulatory Visit (HOSPITAL_COMMUNITY): Payer: Medicare Other

## 2021-05-10 ENCOUNTER — Other Ambulatory Visit: Payer: Self-pay

## 2021-05-10 ENCOUNTER — Encounter (HOSPITAL_COMMUNITY): Payer: Self-pay

## 2021-05-10 DIAGNOSIS — M6281 Muscle weakness (generalized): Secondary | ICD-10-CM

## 2021-05-10 DIAGNOSIS — G8929 Other chronic pain: Secondary | ICD-10-CM

## 2021-05-10 DIAGNOSIS — M25561 Pain in right knee: Secondary | ICD-10-CM | POA: Diagnosis not present

## 2021-05-10 DIAGNOSIS — R262 Difficulty in walking, not elsewhere classified: Secondary | ICD-10-CM

## 2021-05-10 NOTE — Therapy (Signed)
[facility] [address] [place], [place], [address] Phone: [phone]   Fax:  [phone]  Physical Therapy Treatment  Patient Details  Name: Monique Wagner MRN: [id number] Date of Birth: 03-Sep-1941 Referring Provider (PT): MDBrian Swinteck, MD   Encounter Date: 05/10/2021   PT End of Session - 05/10/21 0934     Visit Number 5    Number of Visits 18    Date for PT Re-Evaluation 06/12/21    Authorization Type BCBS medicare - no ath or VL    Progress Note Due on Visit 10    PT Start Time 0916    PT Stop Time 1003    PT Time Calculation (min) 47 min    Activity Tolerance Patient tolerated treatment well    Behavior During Therapy [facility] for tasks assessed/performed             Past Medical History:  Diagnosis Date   Anemia    Arthritis    Diverticulitis    Dyspnea    HTN (hypertension)    Hyperlipidemia    Stroke (HCC)    slight stroke 50 years ago - no deficits    UTI (urinary tract infection) 12/14/2020    Past Surgical History:  Procedure Laterality Date   ABDOMINAL HYSTERECTOMY     BREAST SURGERY     KNEE ARTHROPLASTY Left 05/18/2020   Procedure: COMPUTER ASSISTED TOTAL KNEE ARTHROPLASTY;  Surgeon: Samson Frederic, MD;  Location: [facility];  Service: Orthopedics;  Laterality: Left;   KNEE ARTHROPLASTY Right 04/26/2021   Procedure: COMPUTER ASSISTED TOTAL KNEE ARTHROPLASTY;  Surgeon: Samson Frederic, MD;  Location: [facility];  Service: Orthopedics;  Laterality: Right;    There were no vitals filed for this visit.   Subjective Assessment - 05/10/21 0933     Subjective Pt arrived wearing TED hose, stated she plans to go to apothecary and place order for compression hose.  No reports of pain today.    Pertinent History left TKA, HTN, hx of CVA    Patient Stated Goals to have less pain    Currently in Pain? No/denies                [facility] PT Assessment - 05/10/21 0001       Assessment   Medical Diagnosis R TKA     Referring Provider (PT) MDBrian Swinteck, MD    Onset Date/Surgical Date 04/26/21    Hand Dominance Right    Next MD Visit 05/15/21    Prior Therapy yes for left knee replacement                           [facility] Adult PT Treatment/Exercise - 05/10/21 0001       Ambulation/Gait   Ambulation/Gait Yes    Ambulation/Gait Assistance 6: Modified independent (Device/Increase time)    Ambulation Distance (Feet) 330 Feet    Assistive device Straight cane    Gait Pattern Decreased step length - left;Decreased stride length    Ambulation Surface Level;Indoor    Pre-Gait Activities cueing for sequence with cane    Gait Comments      Exercises   Exercises Knee/Hip      Knee/Hip Exercises: Stretches   Knee: Self-Stretch to increase Flexion Right;5 reps;10 seconds    Knee: Self-Stretch Limitations 12in step height knee drive      Knee/Hip Exercises: Aerobic   Recumbent Bike 5 minutes rocking seat 12--> 11  Knee/Hip Exercises: Standing   Heel Raises Both;10 reps    Heel Raises Limitations toe raises    Knee Flexion 10 reps      Knee/Hip Exercises: Seated   Long Arc Quad Right;15 reps    Long Arc Quad Limitations 5 second holds      Knee/Hip Exercises: Supine   Quad Sets Right;10 reps    Quad Sets Limitations 10" holds    Heel Slides Right;10 reps    Heel Slides Limitations 10 second holds    Bridges 10 reps    Bridges Limitations 3" holds    Straight Leg Raises AROM;15 reps    Straight Leg Raises Limitations quad set prior raise    Knee Extension AROM    Knee Extension Limitations 3    Knee Flexion AROM    Knee Flexion Limitations 95      Manual Therapy   Manual Therapy Edema management;Joint mobilization    Manual therapy comments Manual complete separate rest of tx    Edema Management Retrograde massage with R LE elevated    Joint Mobilization patella mobilizations in all directions                      PT Short Term Goals -  05/01/21 2025       PT SHORT TERM GOAL #1   Title Patient will be independent in self management strategies to improve quality of life and functional outcomes.    Time 3    Period Weeks    Status New    Target Date 05/22/21      PT SHORT TERM GOAL #2   Title Patient will be able to ambulate 226 feet in 2 minutes without AD to demonstrate improved walking mechanics    Time 3    Period Weeks    Status New    Target Date 05/22/21      PT SHORT TERM GOAL #3   Title Patient wil be able to transition from sit <-> stand without UE assist    Time 3    Period Weeks    Status New    Target Date 05/22/21               PT Long Term Goals - 05/01/21 0938       PT LONG TERM GOAL #1   Title Patient will demonstrate at least 0-105 degrees on right LE    Time 6    Period Weeks    Status New    Target Date 06/12/21      PT LONG TERM GOAL #2   Title Patient will improve on FOTO score to meet predicted outcomes to demonstrate improved functional mobility.    Time 6    Period Weeks    Status New    Target Date 06/12/21      PT LONG TERM GOAL #3   Title Patient will report at least 50% improvement in overall symptoms and/or function to demonstrate improved functional mobility    Time 6    Period Weeks    Status New    Target Date 06/12/21                   Plan - 05/10/21 0942     Clinical Impression Statement Pt arrived wearing TED hose, educated difference between hose and compression garment for edema control, pt stated she plans on placing order later today.  Began session with retrograde massage to address edema control.  Pt ambulated wiht minimal assistance with RW, educated proper hand placement and sequence with SPC with SBA and no LOB during gait.  Able to ambulate at increased cadence wiht LRAD.  Therex focus on knee mobility and quad strengthening.  Pt able to make full revolution on bicycle.  Pt tolerated well to session with no reoprts of increased pain  through session.    Personal Factors and Comorbidities Comorbidity 1;Comorbidity 2    Comorbidities L TKA, CVA    Examination-Activity Limitations Locomotion Level;Dressing;Transfers;Stand;Squat;Bed Mobility;Sleep;Sit;Hygiene/Grooming;Lift    Examination-Participation Restrictions Cleaning;Church;Driving;Community Activity;Meal Prep    Stability/Clinical Decision Making Stable/Uncomplicated    Clinical Decision Making Low    Rehab Potential Good    PT Frequency 3x / week    PT Duration 6 weeks    PT Treatment/Interventions ADLs/Self Care Home Management;Aquatic Therapy;Cryotherapy;Electrical Stimulation;Moist Heat;Therapeutic exercise;Manual techniques;Therapeutic activities;Functional mobility training;Stair training;Gait training;DME Instruction;Neuromuscular re-education;Patient/family education;Dry needling;Passive range of motion;Joint Manipulations    PT Next Visit Plan f/u with compression garments. knee ROM, quad activation, edema management.  Progress note next session prior MD apt on Monday 8/15.    PT Home Exercise Plan heel slides, quad sets, elevation 8/5 SLR, heel raise    Consulted and Agree with Plan of Care Patient             Patient will benefit from skilled therapeutic intervention in order to improve the following deficits and impairments:  Decreased endurance, Decreased skin integrity, Increased edema, Decreased scar mobility, Decreased knowledge of use of DME, Decreased strength, Pain, Decreased mobility, Difficulty walking, Decreased range of motion  Visit Diagnosis: Difficulty in walking, not elsewhere classified  Muscle weakness (generalized)  Chronic pain of right knee     Problem List Patient Active Problem List   Diagnosis Date Noted   Osteoarthritis of right knee 04/26/2021   Pre-op evaluation 04/21/2021   Yeast vaginitis 03/23/2021   Vaginal discharge 03/23/2021   Osteoarthritis of left knee 05/18/2020   Anemia    Lower abdominal pain     Diverticulitis 08/28/2017   Pain in the chest    Chest pain 09/03/2014   HTN (hypertension) 09/03/2014   Mixed hyperlipidemia 09/03/2014   Pain in joint, shoulder region 07/10/2012   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   Osteoarthrosis, unspecified whether generalized or localized, lower leg 04/21/2008   KNEE PAIN 04/21/2008   HIGH BLOOD PRESSURE 04/20/2008   Becky Sax, LPTA/CLT; CBIS [phone]  Juel Burrow 05/10/2021, 10:59 AM  [facility] [address] [place], [place], [address] Phone: [phone]   Fax:  [phone]  Name: DEBRA COLON MRN: [id number] Date of Birth: January 11, 1941

## 2021-05-12 ENCOUNTER — Ambulatory Visit (HOSPITAL_COMMUNITY): Payer: Medicare Other

## 2021-05-12 ENCOUNTER — Other Ambulatory Visit: Payer: Self-pay

## 2021-05-12 ENCOUNTER — Encounter (HOSPITAL_COMMUNITY): Payer: Self-pay

## 2021-05-12 DIAGNOSIS — M25561 Pain in right knee: Secondary | ICD-10-CM | POA: Diagnosis not present

## 2021-05-12 DIAGNOSIS — M6281 Muscle weakness (generalized): Secondary | ICD-10-CM

## 2021-05-12 DIAGNOSIS — G8929 Other chronic pain: Secondary | ICD-10-CM

## 2021-05-12 DIAGNOSIS — R262 Difficulty in walking, not elsewhere classified: Secondary | ICD-10-CM

## 2021-05-12 NOTE — Therapy (Addendum)
[facility] [address] [place], [place], [address] Phone: [phone]   Fax:  [phone]  Physical Therapy Treatment  Patient Details  Name: Monique Wagner MRN: [id number] Date of Birth: 1941-01-05 Referring Provider (PT): MDBrian Linna Caprice, MD   Encounter Date: 05/12/2021   PT End of Session - 05/12/21 0828     Visit Number 6    Number of Visits 18    Date for PT Re-Evaluation 06/12/21    Authorization Type BCBS medicare - no ath or VL    Progress Note Due on Visit 10    PT Start Time 0826    PT Stop Time 0912    PT Time Calculation (min) 46 min    Activity Tolerance Patient tolerated treatment well;No increased pain    Behavior During Therapy WFL for tasks assessed/performed             Past Medical History:  Diagnosis Date   Anemia    Arthritis    Diverticulitis    Dyspnea    HTN (hypertension)    Hyperlipidemia    Stroke (HCC)    slight stroke 50 years ago - no deficits    UTI (urinary tract infection) 12/14/2020    Past Surgical History:  Procedure Laterality Date   ABDOMINAL HYSTERECTOMY     BREAST SURGERY     KNEE ARTHROPLASTY Left 05/18/2020   Procedure: COMPUTER ASSISTED TOTAL KNEE ARTHROPLASTY;  Surgeon: Samson Frederic, MD;  Location: [facility];  Service: Orthopedics;  Laterality: Left;   KNEE ARTHROPLASTY Right 04/26/2021   Procedure: COMPUTER ASSISTED TOTAL KNEE ARTHROPLASTY;  Surgeon: Samson Frederic, MD;  Location: [facility];  Service: Orthopedics;  Laterality: Right;    There were no vitals filed for this visit.   Subjective Assessment - 05/12/21 0827     Subjective Reports she has ordered compression hose.  Stated she has began walking without AD indoors, does continue with RW outside.    Pertinent History left TKA, HTN, hx of CVA    Patient Stated Goals to have less pain    Currently in Pain? No/denies                [facility] PT Assessment - 05/12/21 0001       Assessment   Medical  Diagnosis R TKA    Referring Provider (PT) MDBrian Swinteck, MD    Onset Date/Surgical Date 04/26/21    Hand Dominance Right    Next MD Visit 05/15/21    Prior Therapy yes for left knee replacement      Observation/Other Assessments   Focus on Therapeutic Outcomes (FOTO)  62.4% functional   27.7% function     AROM   Left Knee Extension 3    Left Knee Flexion 95   Able to acheive 100 degrees AAROM wiht rope     Ambulation/Gait   Ambulation/Gait Yes    Ambulation/Gait Assistance 6: Modified independent (Device/Increase time)    Ambulation Distance (Feet) 360 Feet    Assistive device None    Gait Pattern Step-through pattern    Ambulation Surface Level;Indoor    Gait Comments      Standardized Balance Assessment   Standardized Balance Assessment Five Times Sit to Stand    Five times sit to stand comments  12.77" cueing for eccentic lowering                           [facility] Adult PT  Treatment/Exercise - 05/12/21 0001       Exercises   Exercises Knee/Hip      Knee/Hip Exercises: Stretches   Knee: Self-Stretch to increase Flexion Right;5 reps;10 seconds    Knee: Self-Stretch Limitations 12in step height knee drive      Knee/Hip Exercises: Aerobic   Recumbent Bike 5 minutes full revolution seat 11      Knee/Hip Exercises: Standing   Heel Raises Both;10 reps    Heel Raises Limitations toe raises    Knee Flexion 10 reps    Terminal Knee Extension 10 reps;Theraband    Theraband Level (Terminal Knee Extension) Level 2 (Red)    Terminal Knee Extension Limitations 5" holds    Gait Training 368ft no AD      Knee/Hip Exercises: Seated   Sit to Sand 2 sets;5 reps;without UE support   cueing for eccentric control     Knee/Hip Exercises: Supine   Quad Sets Right;10 reps    Heel Slides Right;10 reps;AAROM    Heel Slides Limitations 10 second holds, AAROM wiht rope 5 reps    Straight Leg Raises AROM;15 reps    Straight Leg Raises Limitations quad set  prior raise    Knee Extension AROM    Knee Extension Limitations 3    Knee Flexion AROM    Knee Flexion Limitations 96, able to acheive 100 degrees with rope assistance                      PT Short Term Goals - 05/12/21 0835       PT SHORT TERM GOAL #1   Title Patient will be independent in self management strategies to improve quality of life and functional outcomes.    Baseline 05/12/21: Reports compliance with HEP    Status Achieved      PT SHORT TERM GOAL #2   Title Patient will be able to ambulate 226 feet in 2 minutes without AD to demonstrate improved walking mechanics    Baseline 05/12/21: 311ft good gait mechanics with no AD, no LOB    Status Achieved      PT SHORT TERM GOAL #3   Title Patient wil be able to transition from sit <-> stand without UE assist    Baseline 05/12/21: 5 STS complete 12.77" no AD    Status Achieved               PT Long Term Goals - 05/12/21 0901       PT LONG TERM GOAL #1   Title Patient will demonstrate at least 0-105 degrees on right LE    Baseline 05/12/21: 3-96 degrees      PT LONG TERM GOAL #2   Title Patient will improve on FOTO score to meet predicted outcomes to demonstrate improved functional mobility.    Baseline 05/12/21: FOTO score 62.42%, was 27%      PT LONG TERM GOAL #3   Title Patient will report at least 50% improvement in overall symptoms and/or function to demonstrate improved functional mobility    Baseline 05/12/21: Reports 90% improvements    Status Achieved                   Plan - 05/12/21 1017     Clinical Impression Statement Pt progressing well toward POC.  Pt presents with decreased edema proximal knee, currently wearing TED hose and reports she has purchased compression garment just awaiting arrival.  Reviewed goals prior MD apt  next Monday wiht great progress.  Reports she feels 90% improvements and has been compliant with current HEP daily.  Pt able to ambulate wiht appropriate  mechanics with no AD and increased cadence noted with .  AROM improved to 3-96 degrees with ability to flex knee to 100 degrees with AAROM.    Personal Factors and Comorbidities Comorbidity 1;Comorbidity 2    Comorbidities L TKA, CVA    Examination-Activity Limitations Locomotion Level;Dressing;Transfers;Stand;Squat;Bed Mobility;Sleep;Sit;Hygiene/Grooming;Lift    Examination-Participation Restrictions Cleaning;Church;Driving;Community Activity;Meal Prep    Stability/Clinical Decision Making Stable/Uncomplicated    Clinical Decision Making Low    Rehab Potential Good    PT Frequency 3x / week    PT Duration 6 weeks    PT Treatment/Interventions ADLs/Self Care Home Management;Aquatic Therapy;Cryotherapy;Electrical Stimulation;Moist Heat;Therapeutic exercise;Manual techniques;Therapeutic activities;Functional mobility training;Stair training;Gait training;DME Instruction;Neuromuscular re-education;Patient/family education;Dry needling;Passive range of motion;Joint Manipulations    PT Next Visit Plan F/U with MD apt on 8/15.  f/u with compression garments. knee ROM, quad activation, edema management.    PT Home Exercise Plan heel slides, quad sets, elevation 8/5 SLR, heel raise    Consulted and Agree with Plan of Care Patient             Patient will benefit from skilled therapeutic intervention in order to improve the following deficits and impairments:  Decreased endurance, Decreased skin integrity, Increased edema, Decreased scar mobility, Decreased knowledge of use of DME, Decreased strength, Pain, Decreased mobility, Difficulty walking, Decreased range of motion  Visit Diagnosis: Difficulty in walking, not elsewhere classified  Muscle weakness (generalized)  Chronic pain of right knee     Problem List Patient Active Problem List   Diagnosis Date Noted   Osteoarthritis of right knee 04/26/2021   Pre-op evaluation 04/21/2021   Yeast vaginitis 03/23/2021   Vaginal discharge  03/23/2021   Osteoarthritis of left knee 05/18/2020   Anemia    Lower abdominal pain    Diverticulitis 08/28/2017   Pain in the chest    Chest pain 09/03/2014   HTN (hypertension) 09/03/2014   Mixed hyperlipidemia 09/03/2014   Pain in joint, shoulder region 07/10/2012   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   Osteoarthrosis, unspecified whether generalized or localized, lower leg 04/21/2008   KNEE PAIN 04/21/2008   HIGH BLOOD PRESSURE 04/20/2008   Becky Sax, LPTA/CLT; CBIS [phone]  Juel Burrow 05/12/2021, 10:40 AM  [facility] [address] [place], [place], [address] Phone: [phone]   Fax:  [phone]  Name: Monique Wagner MRN: [id number] Date of Birth: 1941/04/18

## 2021-05-15 ENCOUNTER — Encounter (HOSPITAL_COMMUNITY): Payer: Medicare Other

## 2021-05-17 ENCOUNTER — Encounter (HOSPITAL_COMMUNITY): Payer: Self-pay | Admitting: Physical Therapy

## 2021-05-17 ENCOUNTER — Other Ambulatory Visit: Payer: Self-pay

## 2021-05-17 ENCOUNTER — Ambulatory Visit (HOSPITAL_COMMUNITY): Payer: Medicare Other | Admitting: Physical Therapy

## 2021-05-17 DIAGNOSIS — M25561 Pain in right knee: Secondary | ICD-10-CM | POA: Diagnosis not present

## 2021-05-17 DIAGNOSIS — G8929 Other chronic pain: Secondary | ICD-10-CM

## 2021-05-17 DIAGNOSIS — M25662 Stiffness of left knee, not elsewhere classified: Secondary | ICD-10-CM

## 2021-05-17 DIAGNOSIS — R262 Difficulty in walking, not elsewhere classified: Secondary | ICD-10-CM

## 2021-05-17 DIAGNOSIS — M6281 Muscle weakness (generalized): Secondary | ICD-10-CM

## 2021-05-17 NOTE — Therapy (Signed)
[facility] [address] [place], [place], [address] Phone: [phone]   Fax:  [phone]  Physical Therapy Treatment  Patient Details  Name: Monique Wagner MRN: [id number] Date of Birth: 23-Jan-1941 Referring Provider (PT): MDBrian Linna Caprice, MD   Encounter Date: 05/17/2021   PT End of Session - 05/17/21 0836     Visit Number 7    Number of Visits 18    Date for PT Re-Evaluation 06/12/21    Authorization Type BCBS medicare - no ath or VL    Progress Note Due on Visit 10    PT Start Time 0830    PT Stop Time 0910    PT Time Calculation (min) 40 min    Activity Tolerance Patient tolerated treatment well;No increased pain    Behavior During Therapy WFL for tasks assessed/performed             Past Medical History:  Diagnosis Date   Anemia    Arthritis    Diverticulitis    Dyspnea    HTN (hypertension)    Hyperlipidemia    Stroke (HCC)    slight stroke 50 years ago - no deficits    UTI (urinary tract infection) 12/14/2020    Past Surgical History:  Procedure Laterality Date   ABDOMINAL HYSTERECTOMY     BREAST SURGERY     KNEE ARTHROPLASTY Left 05/18/2020   Procedure: COMPUTER ASSISTED TOTAL KNEE ARTHROPLASTY;  Surgeon: Samson Frederic, MD;  Location: [facility];  Service: Orthopedics;  Laterality: Left;   KNEE ARTHROPLASTY Right 04/26/2021   Procedure: COMPUTER ASSISTED TOTAL KNEE ARTHROPLASTY;  Surgeon: Samson Frederic, MD;  Location: [facility];  Service: Orthopedics;  Laterality: Right;    There were no vitals filed for this visit.   Subjective Assessment - 05/17/21 0840     Subjective States she went to the MD on Monday and when they took her bandage off her incision was not completely healed on the bottom part of her incision. States that had to staple her incision closed and she follows up with them in 2 weeks to get them removed. Reports no current pain. MD said to not wear compression garments at this time.     Pertinent History left TKA, HTN, hx of CVA    Patient Stated Goals to have less pain    Currently in Pain? No/denies                [facility] PT Assessment - 05/17/21 0001       Assessment   Medical Diagnosis R TKA    Referring Provider (PT) MDBrian Swinteck, MD    Onset Date/Surgical Date 04/26/21    Hand Dominance Right    Next MD Visit 05/29/21    Prior Therapy yes for left knee replacement      Observation/Other Assessments   Observations bandage in place                           [facility] Adult PT Treatment/Exercise - 05/17/21 0001       Knee/Hip Exercises: Stretches   Active Hamstring Stretch Right;3 reps;30 seconds   seated     Knee/Hip Exercises: Aerobic   Recumbent Bike 5 minutes full revolution seat 11      Knee/Hip Exercises: Standing   Lateral Step Up Hand Hold: 2;Both;Step Height: 4";20 reps   tactile cues to keep hips forward   Forward Step Up Hand Hold: 2;Step Height: 6";Right;3  sets;10 reps    Step Down Right;Hand Hold: 2;Step Height: 4";10 reps;2 sets    Functional Squat --   x25 with upper extremity assist                     PT Short Term Goals - 05/12/21 0835       PT SHORT TERM GOAL #1   Title Patient will be independent in self management strategies to improve quality of life and functional outcomes.    Baseline 05/12/21: Reports compliance with HEP    Status Achieved      PT SHORT TERM GOAL #2   Title Patient will be able to ambulate 226 feet in 2 minutes without AD to demonstrate improved walking mechanics    Baseline 05/12/21: 346ft good gait mechanics with no AD, no LOB    Status Achieved      PT SHORT TERM GOAL #3   Title Patient wil be able to transition from sit <-> stand without UE assist    Baseline 05/12/21: 5 STS complete 12.77" no AD    Status Achieved               PT Long Term Goals - 05/12/21 0901       PT LONG TERM GOAL #1   Title Patient will demonstrate at least 0-105 degrees on  right LE    Baseline 05/12/21: 3-96 degrees      PT LONG TERM GOAL #2   Title Patient will improve on FOTO score to meet predicted outcomes to demonstrate improved functional mobility.    Baseline 05/12/21: FOTO score 62.42%, was 27%      PT LONG TERM GOAL #3   Title Patient will report at least 50% improvement in overall symptoms and/or function to demonstrate improved functional mobility    Baseline 05/12/21: Reports 90% improvements    Status Achieved                   Plan - 05/17/21 0908     Clinical Impression Statement Focused on LE strengthening. Shortness of breath noted with exercises but this resolved  with short rest break. Fatigue in leg noted but no pain. Patient overall doing very well. Able to add steps on this date. Cues to keep toes glued to ground with sit to stands and form significantly improved with this cue. Will continue to work on functional strength and ROM as tolerated by patient.    Personal Factors and Comorbidities Comorbidity 1;Comorbidity 2    Comorbidities L TKA, CVA    Examination-Activity Limitations Locomotion Level;Dressing;Transfers;Stand;Squat;Bed Mobility;Sleep;Sit;Hygiene/Grooming;Lift    Examination-Participation Restrictions Cleaning;Church;Driving;Community Activity;Meal Prep    Stability/Clinical Decision Making Stable/Uncomplicated    Rehab Potential Good    PT Frequency 3x / week    PT Duration 6 weeks    PT Treatment/Interventions ADLs/Self Care Home Management;Aquatic Therapy;Cryotherapy;Electrical Stimulation;Moist Heat;Therapeutic exercise;Manual techniques;Therapeutic activities;Functional mobility training;Stair training;Gait training;DME Instruction;Neuromuscular re-education;Patient/family education;Dry needling;Passive range of motion;Joint Manipulations    PT Next Visit Plan continue with knee ROM, edema management and LE strengthening.    PT Home Exercise Plan heel slides, quad sets, elevation 8/5 SLR, heel raise; 8/17 squat     Consulted and Agree with Plan of Care Patient             Patient will benefit from skilled therapeutic intervention in order to improve the following deficits and impairments:  Decreased endurance, Decreased skin integrity, Increased edema, Decreased scar mobility, Decreased knowledge of use of  DME, Decreased strength, Pain, Decreased mobility, Difficulty walking, Decreased range of motion  Visit Diagnosis: Difficulty in walking, not elsewhere classified  Muscle weakness (generalized)  Chronic pain of right knee  Decreased range of motion (ROM) of left knee     Problem List Patient Active Problem List   Diagnosis Date Noted   Osteoarthritis of right knee 04/26/2021   Pre-op evaluation 04/21/2021   Yeast vaginitis 03/23/2021   Vaginal discharge 03/23/2021   Osteoarthritis of left knee 05/18/2020   Anemia    Lower abdominal pain    Diverticulitis 08/28/2017   Pain in the chest    Chest pain 09/03/2014   HTN (hypertension) 09/03/2014   Mixed hyperlipidemia 09/03/2014   Pain in joint, shoulder region 07/10/2012   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   Osteoarthrosis, unspecified whether generalized or localized, lower leg 04/21/2008   KNEE PAIN 04/21/2008   HIGH BLOOD PRESSURE 04/20/2008    9:12 AM, 05/17/21 Tereasa Coop, DPT Physical Therapy with [facility]  [phone] office   [facility] [address] [place], [place], [address] Phone: [phone]   Fax:  [phone]  Name: Monique Wagner MRN: [id number] Date of Birth: 1941-06-09

## 2021-05-19 ENCOUNTER — Ambulatory Visit (HOSPITAL_COMMUNITY): Payer: Medicare Other

## 2021-05-19 ENCOUNTER — Other Ambulatory Visit: Payer: Self-pay

## 2021-05-19 ENCOUNTER — Encounter (HOSPITAL_COMMUNITY): Payer: Self-pay

## 2021-05-19 DIAGNOSIS — M25561 Pain in right knee: Secondary | ICD-10-CM | POA: Diagnosis not present

## 2021-05-19 DIAGNOSIS — G8929 Other chronic pain: Secondary | ICD-10-CM

## 2021-05-19 DIAGNOSIS — M6281 Muscle weakness (generalized): Secondary | ICD-10-CM

## 2021-05-19 DIAGNOSIS — R262 Difficulty in walking, not elsewhere classified: Secondary | ICD-10-CM

## 2021-05-19 NOTE — Therapy (Signed)
[facility] [address] [place], [place], [address] Phone: [phone]   Fax:  [phone]  Physical Therapy Treatment  Patient Details  Name: Monique Wagner MRN: [id number] Date of Birth: [birth year] Referring Provider (PT): MDBrian Linna Caprice, MD   Encounter Date: 05/19/2021   PT End of Session - 05/19/21 0937     Visit Number 8    Number of Visits 18    Date for PT Re-Evaluation 06/12/21    Authorization Type BCBS medicare - no ath or VL    Progress Note Due on Visit 10    PT Start Time [phone]   late sign in   PT Stop Time 1012    PT Time Calculation (min) 46 min    Activity Tolerance Patient tolerated treatment well;No increased pain    Behavior During Therapy WFL for tasks assessed/performed             Past Medical History:  Diagnosis Date   Anemia    Arthritis    Diverticulitis    Dyspnea    HTN (hypertension)    Hyperlipidemia    Stroke (HCC)    slight stroke 50 years ago - no deficits    UTI (urinary tract infection) 12/14/2020    Past Surgical History:  Procedure Laterality Date   ABDOMINAL HYSTERECTOMY     BREAST SURGERY     KNEE ARTHROPLASTY Left 05/18/2020   Procedure: COMPUTER ASSISTED TOTAL KNEE ARTHROPLASTY;  Surgeon: Samson Frederic, MD;  Location: [facility];  Service: Orthopedics;  Laterality: Left;   KNEE ARTHROPLASTY Right 04/26/2021   Procedure: COMPUTER ASSISTED TOTAL KNEE ARTHROPLASTY;  Surgeon: Samson Frederic, MD;  Location: [facility];  Service: Orthopedics;  Laterality: Right;    There were no vitals filed for this visit.   Subjective Assessment - 05/19/21 0934     Subjective Stated MD removed bandage and incision not completely healed so added staples, returns to MD on 05/29/21.  No reports of pain currently.  Reports she plans to return home today.    Pertinent History left TKA, HTN, hx of CVA    Patient Stated Goals to have less pain    Currently in Pain? No/denies                                [facility] Adult PT Treatment/Exercise - 05/19/21 0001       Ambulation/Gait   Assistive device None    Gait Comments through session with good mechanics      Knee/Hip Exercises: Stretches   Lobbyist 2 reps;30 seconds    Quad Stretch Limitations standing with rope    Knee: Self-Stretch to increase Flexion Right;5 reps;10 seconds    Knee: Self-Stretch Limitations 12in step height knee drive      Knee/Hip Exercises: Aerobic   Recumbent Bike 5 minutes full revolution seat 11      Knee/Hip Exercises: Standing   Heel Raises 20 reps    Heel Raises Limitations toe raises    Knee Flexion 10 reps    Terminal Knee Extension 10 reps;Theraband    Theraband Level (Terminal Knee Extension) Level 2 (Red)    Terminal Knee Extension Limitations 5" holds    Forward Step Up Right;1 set;Hand Hold: 1;Step Height: 4";Step Height: 6"      Knee/Hip Exercises: Seated   Heel Slides AROM;AAROM;10 reps   10" holds   Sit to Sand 2 sets;5 reps;without UE  support   no HHA     Knee/Hip Exercises: Supine   Heel Slides Right;10 reps;AAROM    Heel Slides Limitations 10 second holds, AAROM wiht rope 5 reps    Knee Extension AROM    Knee Extension Limitations 3    Knee Flexion AROM    Knee Flexion Limitations 104                      PT Short Term Goals - 05/12/21 0835       PT SHORT TERM GOAL #1   Title Patient will be independent in self management strategies to improve quality of life and functional outcomes.    Baseline 05/12/21: Reports compliance with HEP    Status Achieved      PT SHORT TERM GOAL #2   Title Patient will be able to ambulate 226 feet in 2 minutes without AD to demonstrate improved walking mechanics    Baseline 05/12/21: 390ft good gait mechanics with no AD, no LOB    Status Achieved      PT SHORT TERM GOAL #3   Title Patient wil be able to transition from sit <-> stand without UE assist    Baseline 05/12/21: 5 STS complete  12.77" no AD    Status Achieved               PT Long Term Goals - 05/12/21 0901       PT LONG TERM GOAL #1   Title Patient will demonstrate at least 0-105 degrees on right LE    Baseline 05/12/21: 3-96 degrees      PT LONG TERM GOAL #2   Title Patient will improve on FOTO score to meet predicted outcomes to demonstrate improved functional mobility.    Baseline 05/12/21: FOTO score 62.42%, was 27%      PT LONG TERM GOAL #3   Title Patient will report at least 50% improvement in overall symptoms and/or function to demonstrate improved functional mobility    Baseline 05/12/21: Reports 90% improvements    Status Achieved                   Plan - 05/19/21 1000     Clinical Impression Statement Added stretches and AAROM heel slides to address knee mobility, AROM 3-104 degrees.  Pt tolerated well to session with improve activity tolerance with 1 seated rest break through session, no reports of SOB or pain.    Personal Factors and Comorbidities Comorbidity 1;Comorbidity 2    Comorbidities L TKA, CVA    Examination-Activity Limitations Locomotion Level;Dressing;Transfers;Stand;Squat;Bed Mobility;Sleep;Sit;Hygiene/Grooming;Lift    Examination-Participation Restrictions Cleaning;Church;Driving;Community Activity;Meal Prep    Stability/Clinical Decision Making Stable/Uncomplicated    Clinical Decision Making Low    Rehab Potential Good    PT Frequency 3x / week    PT Duration 6 weeks    PT Treatment/Interventions ADLs/Self Care Home Management;Aquatic Therapy;Cryotherapy;Electrical Stimulation;Moist Heat;Therapeutic exercise;Manual techniques;Therapeutic activities;Functional mobility training;Stair training;Gait training;DME Instruction;Neuromuscular re-education;Patient/family education;Dry needling;Passive range of motion;Joint Manipulations    PT Next Visit Plan continue with knee ROM, edema management and LE strengthening.    PT Home Exercise Plan heel slides, quad sets,  elevation 8/5 SLR, heel raise; 8/17 squat    Consulted and Agree with Plan of Care Patient             Patient will benefit from skilled therapeutic intervention in order to improve the following deficits and impairments:  Decreased endurance, Decreased skin integrity, Increased edema, Decreased  scar mobility, Decreased knowledge of use of DME, Decreased strength, Pain, Decreased mobility, Difficulty walking, Decreased range of motion  Visit Diagnosis: Difficulty in walking, not elsewhere classified  Muscle weakness (generalized)  Chronic pain of right knee     Problem List Patient Active Problem List   Diagnosis Date Noted   Osteoarthritis of right knee 04/26/2021   Pre-op evaluation 04/21/2021   Yeast vaginitis 03/23/2021   Vaginal discharge 03/23/2021   Osteoarthritis of left knee 05/18/2020   Anemia    Lower abdominal pain    Diverticulitis 08/28/2017   Pain in the chest    Chest pain 09/03/2014   HTN (hypertension) 09/03/2014   Mixed hyperlipidemia 09/03/2014   Pain in joint, shoulder region 07/10/2012   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   Osteoarthrosis, unspecified whether generalized or localized, lower leg 04/21/2008   KNEE PAIN 04/21/2008   HIGH BLOOD PRESSURE 04/20/2008   Becky Sax, LPTA/CLT; CBIS [phone]  Juel Burrow 05/19/2021, 11:40 AM  [facility] [address] [place], [place], [address] Phone: [phone]   Fax:  [phone]  Name: Monique Wagner MRN: [id number] Date of Birth: 09/07/1941

## 2021-05-22 ENCOUNTER — Other Ambulatory Visit: Payer: Self-pay

## 2021-05-22 ENCOUNTER — Encounter (HOSPITAL_COMMUNITY): Payer: Self-pay | Admitting: Orthopedic Surgery

## 2021-05-22 ENCOUNTER — Ambulatory Visit (HOSPITAL_COMMUNITY): Payer: Medicare Other | Admitting: Physical Therapy

## 2021-05-22 DIAGNOSIS — M6281 Muscle weakness (generalized): Secondary | ICD-10-CM

## 2021-05-22 DIAGNOSIS — M25662 Stiffness of left knee, not elsewhere classified: Secondary | ICD-10-CM

## 2021-05-22 DIAGNOSIS — R262 Difficulty in walking, not elsewhere classified: Secondary | ICD-10-CM

## 2021-05-22 DIAGNOSIS — M25561 Pain in right knee: Secondary | ICD-10-CM | POA: Diagnosis not present

## 2021-05-22 DIAGNOSIS — G8929 Other chronic pain: Secondary | ICD-10-CM

## 2021-05-22 MED ORDER — IRRISEPT - 450ML BOTTLE WITH 0.05% CHG IN STERILE WATER, USP 99.95% OPTIME
TOPICAL | Status: AC | PRN
  Administered 2021-04-26: 450 mL

## 2021-05-22 NOTE — Therapy (Signed)
[facility] [address] [place], [place], [address] Phone: [phone]   Fax:  [phone]  Physical Therapy Treatment  Patient Details  Name: Monique Wagner MRN: [id number] Date of Birth: 1940-12-05 Referring Provider (PT): MDBrian Linna Caprice, MD   Encounter Date: 05/22/2021   PT End of Session - 05/22/21 0824     Visit Number 9    Number of Visits 18    Date for PT Re-Evaluation 06/12/21    Authorization Type BCBS medicare - no ath or VL    Progress Note Due on Visit 10    PT Start Time 0830    PT Stop Time 0910    PT Time Calculation (min) 40 min    Activity Tolerance Patient tolerated treatment well;No increased pain    Behavior During Therapy WFL for tasks assessed/performed             Past Medical History:  Diagnosis Date   Anemia    Arthritis    Diverticulitis    Dyspnea    HTN (hypertension)    Hyperlipidemia    Stroke (HCC)    slight stroke 50 years ago - no deficits    UTI (urinary tract infection) 12/14/2020    Past Surgical History:  Procedure Laterality Date   ABDOMINAL HYSTERECTOMY     BREAST SURGERY     KNEE ARTHROPLASTY Left 05/18/2020   Procedure: COMPUTER ASSISTED TOTAL KNEE ARTHROPLASTY;  Surgeon: Samson Frederic, MD;  Location: [facility];  Service: Orthopedics;  Laterality: Left;   KNEE ARTHROPLASTY Right 04/26/2021   Procedure: COMPUTER ASSISTED TOTAL KNEE ARTHROPLASTY;  Surgeon: Samson Frederic, MD;  Location: [facility];  Service: Orthopedics;  Laterality: Right;    There were no vitals filed for this visit.   Subjective Assessment - 05/22/21 0833     Subjective States she feels good, has mild discomfort on the front of her left hip with walking. STates she has her bandage and needs help changing it.    Pertinent History left TKA, HTN, hx of CVA    Patient Stated Goals to have less pain    Currently in Pain? No/denies                [facility] PT Assessment - 05/22/21 0001       Assessment    Medical Diagnosis R TKA    Referring Provider (PT) MDBrian Swinteck, MD    Onset Date/Surgical Date 04/26/21    Hand Dominance Right    Next MD Visit 05/29/21                           [facility] Adult PT Treatment/Exercise - 05/22/21 0001       Knee/Hip Exercises: Aerobic   Recumbent Bike 4 minutes - and verbal cues - rocking back and forth on seat 10 and then complate revolutions      Knee/Hip Exercises: Standing   Terminal Knee Extension Right;2 sets;10 reps   with ball. 5" holds   Lateral Step Up Hand Hold: 2;Step Height: 6";10 reps;2 sets;Both    Forward Step Up Right;1 set;Hand Hold: 1;Step Height: 6";10 reps      Knee/Hip Exercises: Seated   Sit to Sand without UE support   x25     Knee/Hip Exercises: Supine   Heel Slides AROM;Right;5 reps   15 second holds - PT assist as needed   Bridges 10 reps;2 sets    Bridges Limitations 3" holds  Knee Extension AROM    Knee Extension Limitations 3   lacking   Knee Flexion AROM    Knee Flexion Limitations 108    Other Supine Knee/Hip Exercises knee bends on ball 2 minutes                    PT Education - 05/22/21 0834     Education Details on wound care, on dressing changes    Person(s) Educated Patient    Methods Explanation    Comprehension Verbalized understanding              PT Short Term Goals - 05/12/21 0835       PT SHORT TERM GOAL #1   Title Patient will be independent in self management strategies to improve quality of life and functional outcomes.    Baseline 05/12/21: Reports compliance with HEP    Status Achieved      PT SHORT TERM GOAL #2   Title Patient will be able to ambulate 226 feet in 2 minutes without AD to demonstrate improved walking mechanics    Baseline 05/12/21: 398ft good gait mechanics with no AD, no LOB    Status Achieved      PT SHORT TERM GOAL #3   Title Patient wil be able to transition from sit <-> stand without UE assist    Baseline 05/12/21: 5 STS  complete 12.77" no AD    Status Achieved               PT Long Term Goals - 05/12/21 0901       PT LONG TERM GOAL #1   Title Patient will demonstrate at least 0-105 degrees on right LE    Baseline 05/12/21: 3-96 degrees      PT LONG TERM GOAL #2   Title Patient will improve on FOTO score to meet predicted outcomes to demonstrate improved functional mobility.    Baseline 05/12/21: FOTO score 62.42%, was 27%      PT LONG TERM GOAL #3   Title Patient will report at least 50% improvement in overall symptoms and/or function to demonstrate improved functional mobility    Baseline 05/12/21: Reports 90% improvements    Status Achieved                   Plan - 05/22/21 0909     Clinical Impression Statement Bandage changed per patient request for assistance with this. Overall incision looks good with no signs of infection. Cleansed surrounding skin and replaced bandage. Challenged patient with increased reps for sit to stand which was challenging but patient able to perform with good form. Fatigue noted end of session but no pain. Will continue with current POC, patient continues to improve in knee ROM.    Personal Factors and Comorbidities Comorbidity 1;Comorbidity 2    Comorbidities L TKA, CVA    Examination-Activity Limitations Locomotion Level;Dressing;Transfers;Stand;Squat;Bed Mobility;Sleep;Sit;Hygiene/Grooming;Lift    Examination-Participation Restrictions Cleaning;Church;Driving;Community Activity;Meal Prep    Stability/Clinical Decision Making Stable/Uncomplicated    Rehab Potential Good    PT Frequency 3x / week    PT Duration 6 weeks    PT Treatment/Interventions ADLs/Self Care Home Management;Aquatic Therapy;Cryotherapy;Electrical Stimulation;Moist Heat;Therapeutic exercise;Manual techniques;Therapeutic activities;Functional mobility training;Stair training;Gait training;DME Instruction;Neuromuscular re-education;Patient/family education;Dry needling;Passive range of  motion;Joint Manipulations    PT Next Visit Plan continue with knee ROM, edema management and LE strengthening.    PT Home Exercise Plan heel slides, quad sets, elevation 8/5 SLR, heel raise; 8/17 squat  Consulted and Agree with Plan of Care Patient             Patient will benefit from skilled therapeutic intervention in order to improve the following deficits and impairments:  Decreased endurance, Decreased skin integrity, Increased edema, Decreased scar mobility, Decreased knowledge of use of DME, Decreased strength, Pain, Decreased mobility, Difficulty walking, Decreased range of motion  Visit Diagnosis: Difficulty in walking, not elsewhere classified  Chronic pain of right knee  Muscle weakness (generalized)  Decreased range of motion (ROM) of left knee     Problem List Patient Active Problem List   Diagnosis Date Noted   Osteoarthritis of right knee 04/26/2021   Pre-op evaluation 04/21/2021   Yeast vaginitis 03/23/2021   Vaginal discharge 03/23/2021   Osteoarthritis of left knee 05/18/2020   Anemia    Lower abdominal pain    Diverticulitis 08/28/2017   Pain in the chest    Chest pain 09/03/2014   HTN (hypertension) 09/03/2014   Mixed hyperlipidemia 09/03/2014   Pain in joint, shoulder region 07/10/2012   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   Osteoarthrosis, unspecified whether generalized or localized, lower leg 04/21/2008   KNEE PAIN 04/21/2008   HIGH BLOOD PRESSURE 04/20/2008   9:12 AM, 05/22/21 Tereasa Coop, DPT Physical Therapy with [facility]  [phone] office   [facility] [address] [place], [place], [address] Phone: [phone]   Fax:  [phone]  Name: Monique Wagner MRN: [id number] Date of Birth: 1940/12/25

## 2021-05-23 ENCOUNTER — Other Ambulatory Visit: Payer: Self-pay

## 2021-05-23 NOTE — Telephone Encounter (Signed)
Verbal refill via phone

## 2021-05-24 ENCOUNTER — Ambulatory Visit (HOSPITAL_COMMUNITY): Payer: Medicare Other

## 2021-05-24 ENCOUNTER — Other Ambulatory Visit: Payer: Self-pay

## 2021-05-24 DIAGNOSIS — G8929 Other chronic pain: Secondary | ICD-10-CM

## 2021-05-24 DIAGNOSIS — R262 Difficulty in walking, not elsewhere classified: Secondary | ICD-10-CM

## 2021-05-24 DIAGNOSIS — M6281 Muscle weakness (generalized): Secondary | ICD-10-CM

## 2021-05-24 DIAGNOSIS — M25561 Pain in right knee: Secondary | ICD-10-CM | POA: Diagnosis not present

## 2021-05-24 NOTE — Therapy (Addendum)
[facility] [address] [place], [place], [address] Phone: [phone]   Fax:  [phone]  Physical Therapy Treatment and Discharge Note  Patient Details  Name: Monique Wagner MRN: [id number] Date of Birth: October 21, 1940 Referring Provider (PT): MDBrian Swinteck, MD   PHYSICAL THERAPY DISCHARGE SUMMARY  Visits from Start of Care: 10  Current functional level related to goals / functional outcomes: See below  Remaining deficits: See below   Education / Equipment: See below  Patient agrees to discharge. Patient goals were partially met. Patient is being discharged due to being pleased with the current functional level.  7:58 AM, 06/06/21 [name], DPT Physical Therapy with [facility]  [phone] office   Encounter Date: 05/24/2021   PT End of Session - 05/24/21 0925     Visit Number 10    Number of Visits 18    Date for PT Re-Evaluation 06/12/21    Authorization Type BCBS medicare - no ath or VL    Progress Note Due on Visit 20    PT Start Time 0919    PT Stop Time 1000    PT Time Calculation (min) 41 min    Activity Tolerance Patient tolerated treatment well;No increased pain    Behavior During Therapy WFL for tasks assessed/performed             Past Medical History:  Diagnosis Date   Anemia    Arthritis    Diverticulitis    Dyspnea    HTN (hypertension)    Hyperlipidemia    Stroke (HCC)    slight stroke 50 years ago - no deficits    UTI (urinary tract infection) 12/14/2020    Past Surgical History:  Procedure Laterality Date   ABDOMINAL HYSTERECTOMY     BREAST SURGERY     KNEE ARTHROPLASTY Left 05/18/2020   Procedure: COMPUTER ASSISTED TOTAL KNEE ARTHROPLASTY;  Surgeon: Rod Can, MD;  Location: [facility];  Service: Orthopedics;  Laterality: Left;   KNEE ARTHROPLASTY Right 04/26/2021   Procedure: COMPUTER ASSISTED TOTAL KNEE ARTHROPLASTY;  Surgeon: Rod Can, MD;   Location: [facility];  Service: Orthopedics;  Laterality: Right;    There were no vitals filed for this visit.   Subjective Assessment - 05/24/21 0923     Subjective Pt stated she is apprehensive with upcoming MD apt and worried about the removal of bandages, hoping she is fully healed.    Pertinent History left TKA, HTN, hx of CVA    Patient Stated Goals to have less pain    Currently in Pain? No/denies                [facility] PT Assessment - 05/24/21 0001       Assessment   Medical Diagnosis R TKA    Referring Provider (PT) MDBrian Swinteck, MD    Onset Date/Surgical Date 04/26/21    Hand Dominance Right    Next MD Visit 05/29/21    Prior Therapy yes for left knee replacement      Observation/Other Assessments   Focus on Therapeutic Outcomes (FOTO)  62.83% functional   was 62.4% functional     AROM   AROM Assessment Site Knee    Right/Left Knee Right;Left    Right Knee Extension 2   1   Right Knee Flexion 110   105   Left Knee Extension --   3   Left Knee Flexion --   95     Ambulation/Gait  Gait Comments 2MWT      Standardized Balance Assessment   Standardized Balance Assessment Five Times Sit to Stand    Five times sit to stand comments  12.77" cueing for eccentic lowering   was 12.77" cueing for eccentic lowering                          [facility] Adult PT Treatment/Exercise - 05/24/21 0001       Ambulation/Gait   Ambulation/Gait Yes    Ambulation/Gait Assistance 6: Modified independent (Device/Increase time)    Ambulation Distance (Feet) 372 Feet    Assistive device None;Straight cane   Encouraged SPC for Lt hip pain, improved gait mechanics   Gait Pattern Step-through pattern    Ambulation Surface Level;Indoor    Stairs Yes    Stairs Assistance 5: Supervision    Stair Management Technique One rail Right;Alternating pattern    Number of Stairs 20   5RT 4 steps   Height of Stairs 7      Exercises   Exercises Knee/Hip      Knee/Hip  Exercises: Stretches   Quad Stretch 1 rep;30 seconds    Hip Flexor Stretch Left;5 reps;20 seconds    Knee: Self-Stretch to increase Flexion Right;5 reps;10 seconds    Knee: Self-Stretch Limitations 12in step height knee drive      Knee/Hip Exercises: Aerobic   Recumbent Bike 5 min full revolution      Knee/Hip Exercises: Seated   Sit to Sand 3 sets;5 reps;without UE support   5 STS 13.21"     Knee/Hip Exercises: Supine   Heel Slides AROM;AAROM    Heel Slides Limitations 10 second holds, AAROM wiht rope 5 reps    Knee Extension AROM    Knee Extension Limitations 2    Knee Flexion AROM    Knee Flexion Limitations 110                      PT Short Term Goals - 05/12/21 0835       PT SHORT TERM GOAL #1   Title Patient will be independent in self management strategies to improve quality of life and functional outcomes.    Baseline 05/12/21: Reports compliance with HEP    Status Achieved      PT SHORT TERM GOAL #2   Title Patient will be able to ambulate 226 feet in 2 minutes without AD to demonstrate improved walking mechanics    Baseline 05/12/21: 2MWT 375f good gait mechanics with no AD, no LOB    Status Achieved      PT SHORT TERM GOAL #3   Title Patient wil be able to transition from sit <-> stand without UE assist    Baseline 05/12/21: 5 STS complete 12.77" no AD    Status Achieved               PT Long Term Goals - 05/24/21 03094      PT LONG TERM GOAL #1   Title Patient will demonstrate at least 0-105 degrees on right LE    Baseline 05/24/21: AROM 2-110; degrees; 05/12/21: 3-96 degrees    Status Partially Met      PT LONG TERM GOAL #2   Title Patient will improve on FOTO score to meet predicted outcomes to demonstrate improved functional mobility.    Baseline 05/24/21: 62.83%; 05/12/21: FOTO score 62.42%, was 27%    Status Achieved  PT LONG TERM GOAL #3   Title Patient will report at least 50% improvement in overall symptoms and/or function to  demonstrate improved functional mobility    Baseline 05/24/21: 98% improvements; 05/12/21: Reports 90% improvements    Status Achieved      PT LONG TERM GOAL #4   Title --                   Plan - 05/24/21 1002     Clinical Impression Statement 10th visit progress note with great progress.  Objective testing complete with the following improvements in 2MWT, 5 STS, AROM.  Pt limited by Lt hip pain and antalgic gait mechanics without SPC, improved gait mechanics and increased cadence with AD.  Pt able to complete reciprocal pattern with stairs, cueing to reduce rotation descending stairs and able to complete appropriately following.  AROM 2-110 degrees.  Improved self perceived functional abilities noted wiht FOTO score.  At this stage pt has met all goals except for extension.  Pt with MD apt Monday.    Personal Factors and Comorbidities Comorbidity 1;Comorbidity 2    Comorbidities L TKA, CVA    Examination-Activity Limitations Locomotion Level;Dressing;Transfers;Stand;Squat;Bed Mobility;Sleep;Sit;Hygiene/Grooming;Lift    Examination-Participation Restrictions Cleaning;Church;Driving;Community Activity;Meal Prep    Stability/Clinical Decision Making Stable/Uncomplicated    Clinical Decision Making Low    Rehab Potential Good    PT Frequency 3x / week    PT Duration 6 weeks    PT Treatment/Interventions ADLs/Self Care Home Management;Aquatic Therapy;Cryotherapy;Electrical Stimulation;Moist Heat;Therapeutic exercise;Manual techniques;Therapeutic activities;Functional mobility training;Stair training;Gait training;DME Instruction;Neuromuscular re-education;Patient/family education;Dry needling;Passive range of motion;Joint Manipulations    PT Next Visit Plan F/U wiht MD apt.  Probable DC to HEP.    PT Home Exercise Plan heel slides, quad sets, elevation 8/5 SLR, heel raise; 8/17 squat    Consulted and Agree with Plan of Care Patient             Patient will benefit from skilled  therapeutic intervention in order to improve the following deficits and impairments:  Decreased endurance, Decreased skin integrity, Increased edema, Decreased scar mobility, Decreased knowledge of use of DME, Decreased strength, Pain, Decreased mobility, Difficulty walking, Decreased range of motion  Visit Diagnosis: Difficulty in walking, not elsewhere classified  Chronic pain of right knee  Muscle weakness (generalized)     Problem List Patient Active Problem List   Diagnosis Date Noted   Osteoarthritis of right knee 04/26/2021   Pre-op evaluation 04/21/2021   Yeast vaginitis 03/23/2021   Vaginal discharge 03/23/2021   Osteoarthritis of left knee 05/18/2020   Anemia    Lower abdominal pain    Diverticulitis 08/28/2017   Pain in the chest    Chest pain 09/03/2014   HTN (hypertension) 09/03/2014   Mixed hyperlipidemia 09/03/2014   Pain in joint, shoulder region 07/10/2012   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   Osteoarthrosis, unspecified whether generalized or localized, lower leg 04/21/2008   KNEE PAIN 04/21/2008   HIGH BLOOD PRESSURE 04/20/2008   Ihor Austin, LPTA/CLT; CBIS [phone]  Aldona Lento 05/24/2021, 10:16 AM  [facility] [address] [place], [place], [address] Phone: [phone]   Fax:  [phone]  Name: CHRISTIANA GUREVICH MRN: [id number] Date of Birth: [birth year]

## 2021-05-26 ENCOUNTER — Encounter (HOSPITAL_COMMUNITY): Payer: Medicare Other | Admitting: Physical Therapy

## 2021-05-29 ENCOUNTER — Encounter (HOSPITAL_COMMUNITY): Payer: Medicare Other | Admitting: Physical Therapy

## 2021-05-31 ENCOUNTER — Encounter (HOSPITAL_COMMUNITY): Payer: Medicare Other | Admitting: Physical Therapy

## 2021-06-02 ENCOUNTER — Encounter (HOSPITAL_COMMUNITY): Payer: Medicare Other | Admitting: Physical Therapy

## 2021-06-07 ENCOUNTER — Encounter (HOSPITAL_COMMUNITY): Payer: Medicare Other

## 2021-06-09 ENCOUNTER — Encounter (HOSPITAL_COMMUNITY): Payer: Medicare Other | Admitting: Physical Therapy

## 2021-06-12 ENCOUNTER — Encounter (HOSPITAL_COMMUNITY): Payer: Medicare Other | Admitting: Physical Therapy

## 2021-06-14 ENCOUNTER — Encounter (HOSPITAL_COMMUNITY): Payer: Medicare Other

## 2021-06-15 ENCOUNTER — Encounter: Payer: Self-pay | Admitting: Emergency Medicine

## 2021-06-15 ENCOUNTER — Ambulatory Visit
Admission: EM | Admit: 2021-06-15 | Discharge: 2021-06-15 | Disposition: A | Discharge status: Home / Self Care | Payer: Medicare Other | Attending: Emergency Medicine | Admitting: Emergency Medicine

## 2021-06-15 ENCOUNTER — Other Ambulatory Visit: Payer: Self-pay

## 2021-06-15 DIAGNOSIS — R21 Rash and other nonspecific skin eruption: Secondary | ICD-10-CM | POA: Diagnosis not present

## 2021-06-15 MED ORDER — PREDNISONE 20 MG PO TABS: 20.0000 mg | ORAL_TABLET | Freq: Two times a day (BID) | ORAL | 0 refills | Status: AC

## 2021-06-15 NOTE — Discharge Instructions (Signed)
Wash with warm water and mild soap Prednisone prescribed for rash Follow up with PCP next week for recheck Return or go to the ER if you have any new or worsening symptoms such as fever, chills, nausea, vomiting, redness, swelling, discharge, if symptoms do not improve with medications, etc..Marland Kitchen

## 2021-06-15 NOTE — ED Provider Notes (Signed)
[facility]   [id number] 06/15/21 Arrival Time: 1203  CC: rash   SUBJECTIVE:  Monique Wagner is a 80 y.o. female who presents with rash to bilateral forearms x few days.  Denies precipitating event or trauma.  Denies changes in soaps, detergents, close contacts with similar rash, known trigger or environmental trigger, allergy. Denies medications change or starting a new medication recently.  Localizes the rash to LT forearm, and now spread to RT.  Describes it as itchy.  Has NOT tried OTC medications without relief.  Symptoms are made worse with scratching.  Denies similar symptoms in the past.   Denies fever, chills, nausea, vomiting, erythema, swelling, discharge.  ROS: As per HPI.  All other pertinent ROS negative.     Past Medical History:  Diagnosis Date   Anemia    Arthritis    Diverticulitis    Dyspnea    HTN (hypertension)    Hyperlipidemia    Stroke (HCC)    slight stroke 50 years ago - no deficits    UTI (urinary tract infection) 12/14/2020   Past Surgical History:  Procedure Laterality Date   ABDOMINAL HYSTERECTOMY     BREAST SURGERY     KNEE ARTHROPLASTY Left 05/18/2020   Procedure: COMPUTER ASSISTED TOTAL KNEE ARTHROPLASTY;  Surgeon: Samson Frederic, MD;  Location: [facility];  Service: Orthopedics;  Laterality: Left;   KNEE ARTHROPLASTY Right 04/26/2021   Procedure: COMPUTER ASSISTED TOTAL KNEE ARTHROPLASTY;  Surgeon: Samson Frederic, MD;  Location: [facility];  Service: Orthopedics;  Laterality: Right;   Allergies  Allergen Reactions   Latex Rash   Tape Rash   Current Facility-Administered Medications on File Prior to Encounter  Medication Dose Route Frequency Provider Last Rate Last Admin   IRRISEPT - 0.05% chlorhexedine in sterile water for irrigation    PRN Samson Frederic, MD   450 mL at 04/26/21 1514   Current Outpatient Medications on File Prior to Encounter  Medication Sig Dispense Refill   acetaminophen (TYLENOL) 325 MG tablet Take 650 mg by mouth  every 6 (six) hours as needed for moderate pain.     Acetaminophen-Codeine 300-30 MG tablet Take 1 tablet by mouth every 12 (twelve) hours as needed for pain.     atorvastatin (LIPITOR) 40 MG tablet TAKE 1 TABLET BY MOUTH EVERY DAY 90 tablet 1   BLACK CURRANT SEED OIL PO Take 5 mLs by mouth 2 (two) times a week.     cholecalciferol (VITAMIN D3) 25 MCG (1000 UNIT) tablet Take 1,000 Units by mouth daily.     CRANBERRY PO Take 1 tablet by mouth in the morning and at bedtime.     DANDELION PO Take 1 capsule by mouth daily.     docusate sodium (COLACE) 100 MG capsule Take 1 capsule (100 mg total) by mouth 2 (two) times daily. 10 capsule 0   dorzolamide-timolol (COSOPT) 22.3-6.8 MG/ML ophthalmic solution Place 1 drop into both eyes 2 (two) times daily.     HYDROcodone-acetaminophen (NORCO/VICODIN) 5-325 MG tablet Take 1-2 tablets by mouth every 4 (four) hours as needed for moderate pain (pain score 4-6). 40 tablet 0   Milk Thistle 150 MG CAPS Take 150 mg by mouth daily.     mirabegron ER (MYRBETRIQ) 25 MG TB24 tablet Take 1 tablet (25 mg total) by mouth daily. 30 tablet 11   Multiple Vitamins-Minerals (MULTIVITAMIN PO) Take 1 tablet by mouth daily.      Netarsudil-Latanoprost (ROCKLATAN) 0.02-0.005 % SOLN Place 1 drop into both eyes  every evening.     Omega-3 Fatty Acids (FISH OIL) 1200 MG CAPS Take 1,200 mg by mouth daily.     ondansetron (ZOFRAN) 4 MG tablet Take 1 tablet (4 mg total) by mouth every 6 (six) hours as needed for nausea. 20 tablet 0   Probiotic CAPS Take 1 capsule by mouth daily.     senna (SENOKOT) 8.6 MG TABS tablet Take 1 tablet (8.6 mg total) by mouth 2 (two) times daily. 120 tablet 0   telmisartan-hydrochlorothiazide (MICARDIS HCT) 40-12.5 MG tablet Take 1 tablet by mouth daily.      Turmeric 450 MG CAPS Take 450 mg by mouth daily.     Social History   Socioeconomic History   Marital status: Widowed    Spouse name: Not on file   Number of children: 1   Years of education: 14    Highest education level: Not on file  Occupational History   Not on file  Tobacco Use   Smoking status: Never   Smokeless tobacco: Never  Vaping Use   Vaping Use: Never used  Substance and Sexual Activity   Alcohol use: No   Drug use: No   Sexual activity: Not Currently    Birth control/protection: Surgical    Comment: hyst  Other Topics Concern   Not on file  Social History Narrative   Not on file   Social Determinants of Health   Financial Resource Strain: Not on file  Food Insecurity: Not on file  Transportation Needs: Not on file  Physical Activity: Not on file  Stress: Not on file  Social Connections: Not on file  Intimate Partner Violence: Not on file   Family History  Problem Relation Age of Onset   Arthritis Other    Cancer Other    Diabetes Other    Heart disease Other     OBJECTIVE: Vitals:   06/15/21 1300  BP: 133/73  Pulse: 66  Resp: 18  Temp: 98 F (36.7 C)  TempSrc: Temporal  SpO2: 98%    General appearance: alert; no distress Head: NCAT Lungs: normal respiratory effort Extremities: no edema Skin: warm and dry; mildly erythematous papules diffuse about the bilateral forearms, NTTP, no obvious drainage or bleeding Psychological: alert and cooperative; normal mood and affect  ASSESSMENT & PLAN:  1. Rash and nonspecific skin eruption    Meds ordered this encounter  Medications   predniSONE (DELTASONE) 20 MG tablet    Sig: Take 1 tablet (20 mg total) by mouth 2 (two) times daily with a meal for 5 days.    Dispense:  10 tablet    Refill:  0    Order Specific Question:   Supervising Provider    Answer:   Eustace Moore [[id number]]   Wash with warm water and mild soap Prednisone prescribed for rash Follow up with PCP next week for recheck Return or go to the ER if you have any new or worsening symptoms such as fever, chills, nausea, vomiting, redness, swelling, discharge, if symptoms do not improve with medications, etc...  Reviewed  expectations re: course of current medical issues. Questions answered. Outlined signs and symptoms indicating need for more acute intervention. Patient verbalized understanding. After Visit Summary given.    Rennis Harding, PA-C 06/15/21 1312

## 2021-06-15 NOTE — ED Triage Notes (Signed)
Rash to bilateral lower arms x 2 days

## 2021-06-16 ENCOUNTER — Encounter (HOSPITAL_COMMUNITY): Payer: Medicare Other

## 2021-07-24 ENCOUNTER — Other Ambulatory Visit (HOSPITAL_COMMUNITY): Payer: Self-pay | Admitting: Family Medicine

## 2021-07-24 DIAGNOSIS — Z1231 Encounter for screening mammogram for malignant neoplasm of breast: Secondary | ICD-10-CM

## 2021-08-07 ENCOUNTER — Other Ambulatory Visit: Payer: Self-pay

## 2021-08-07 ENCOUNTER — Ambulatory Visit (HOSPITAL_COMMUNITY)
Admission: RE | Admit: 2021-08-07 | Discharge: 2021-08-07 | Disposition: A | Discharge status: Home / Self Care | Payer: Medicare Other | Source: Ambulatory Visit | Attending: Family Medicine | Admitting: Family Medicine

## 2021-08-07 DIAGNOSIS — Z1231 Encounter for screening mammogram for malignant neoplasm of breast: Secondary | ICD-10-CM | POA: Insufficient documentation

## 2021-08-09 IMAGING — MG MM DIGITAL SCREENING BILAT W/ TOMO W/ CAD
6 of 10 series · 6 of 30 positions shown · non-contrast
Comparison: Previous exam(s).

CLINICAL DATA: Screening.

EXAM:
DIGITAL SCREENING BILATERAL MAMMOGRAM WITH TOMO AND CAD

[L CC synth-2D (1 of 2)]
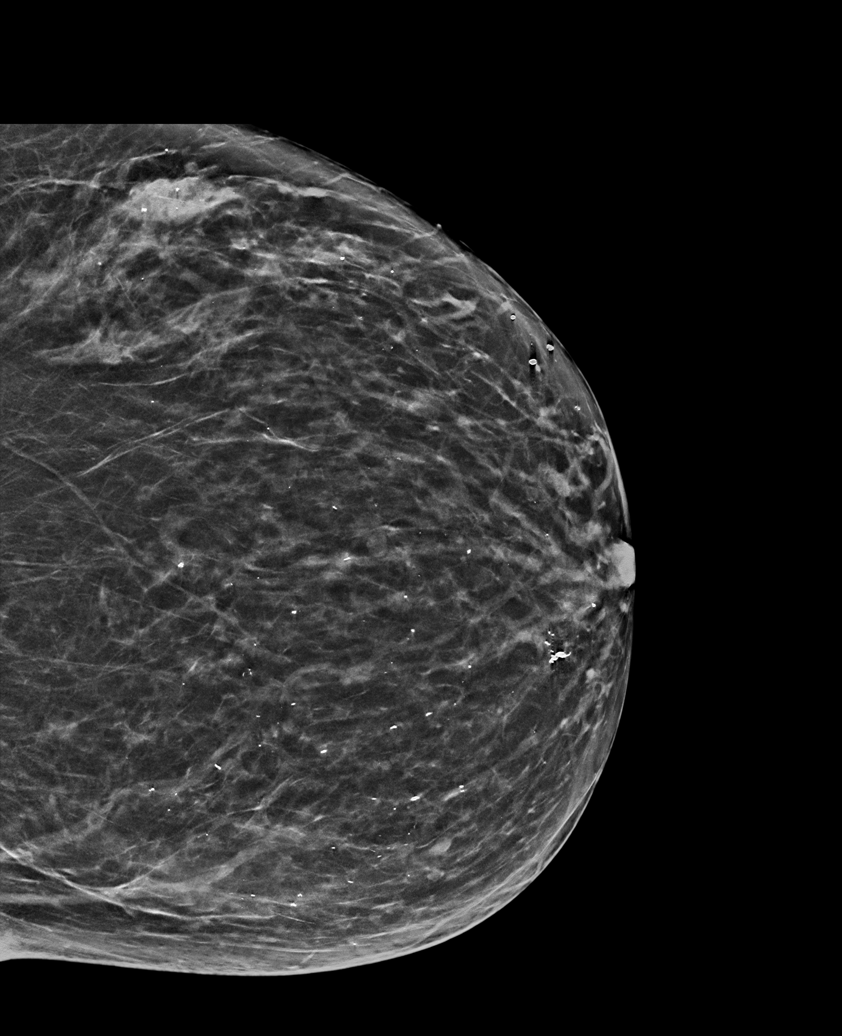

[L MLO synth-2D]
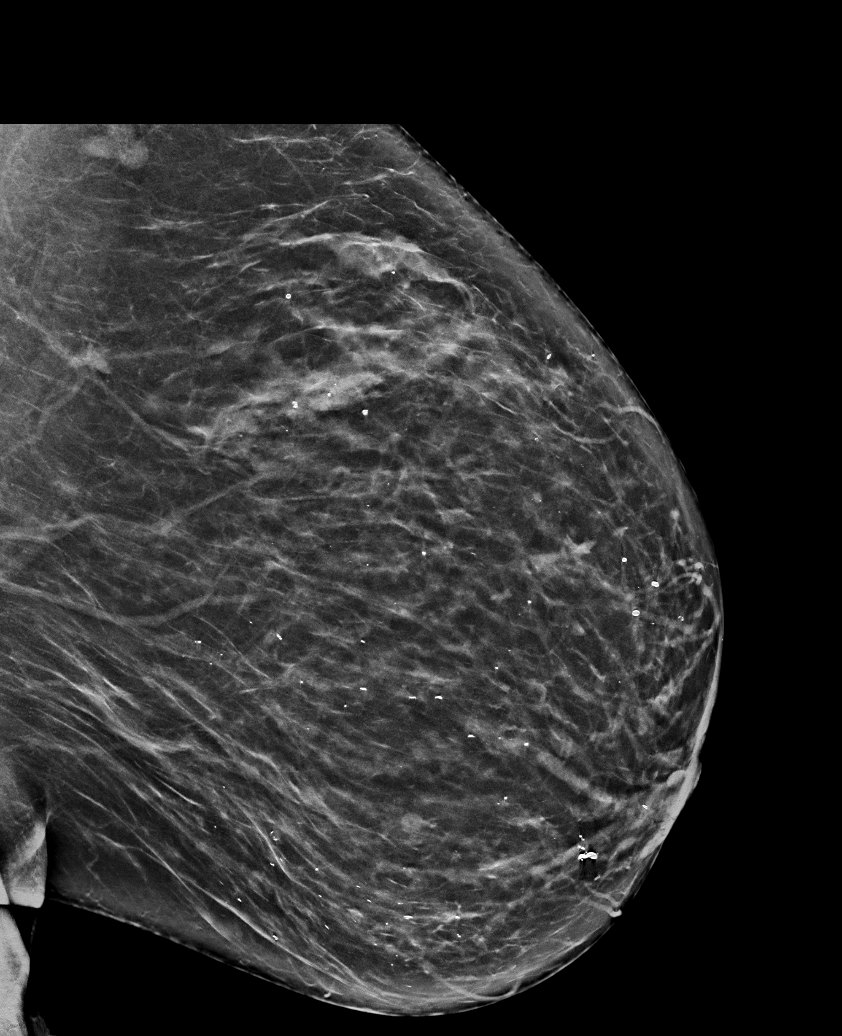

[R MLO synth-2D]
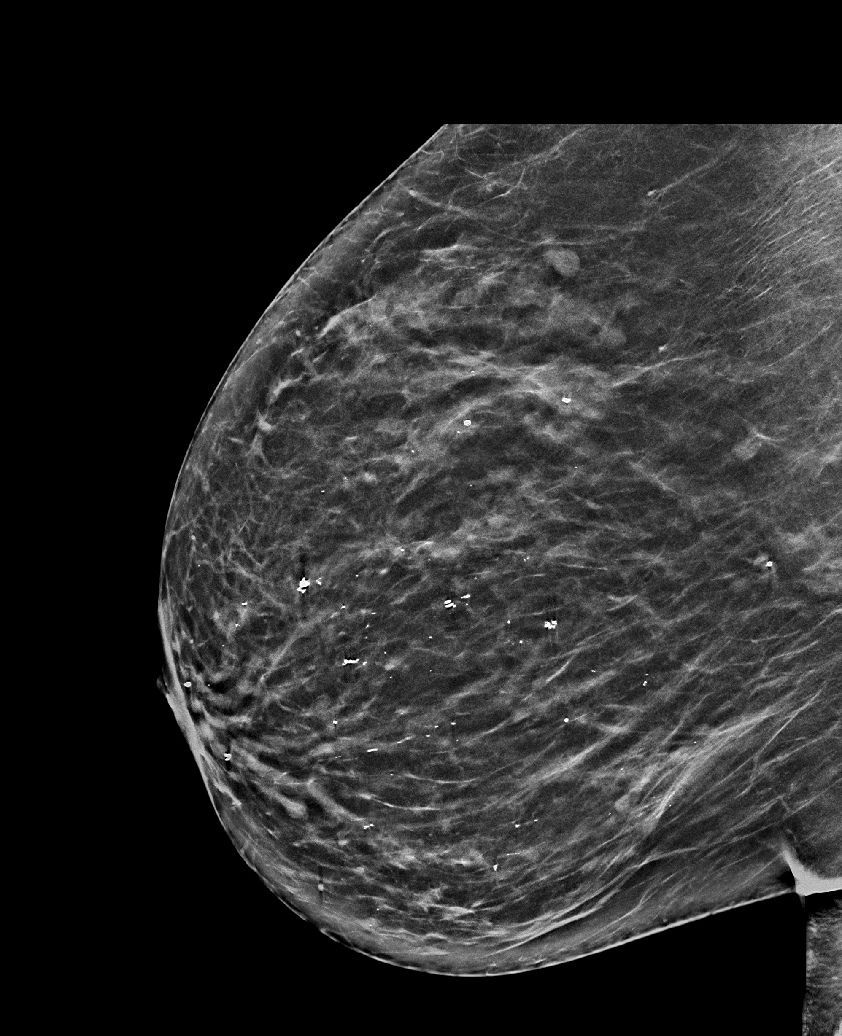

[R CC synth-2D]
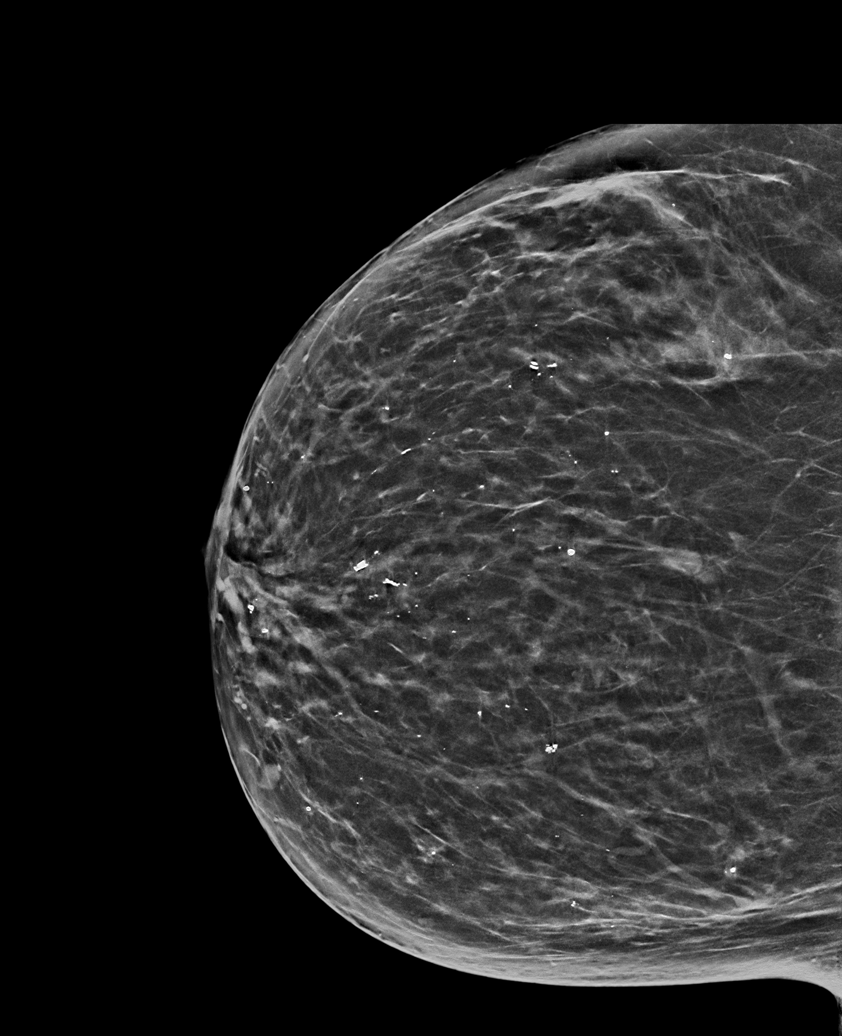

[L CC synth-2D (2 of 2)]
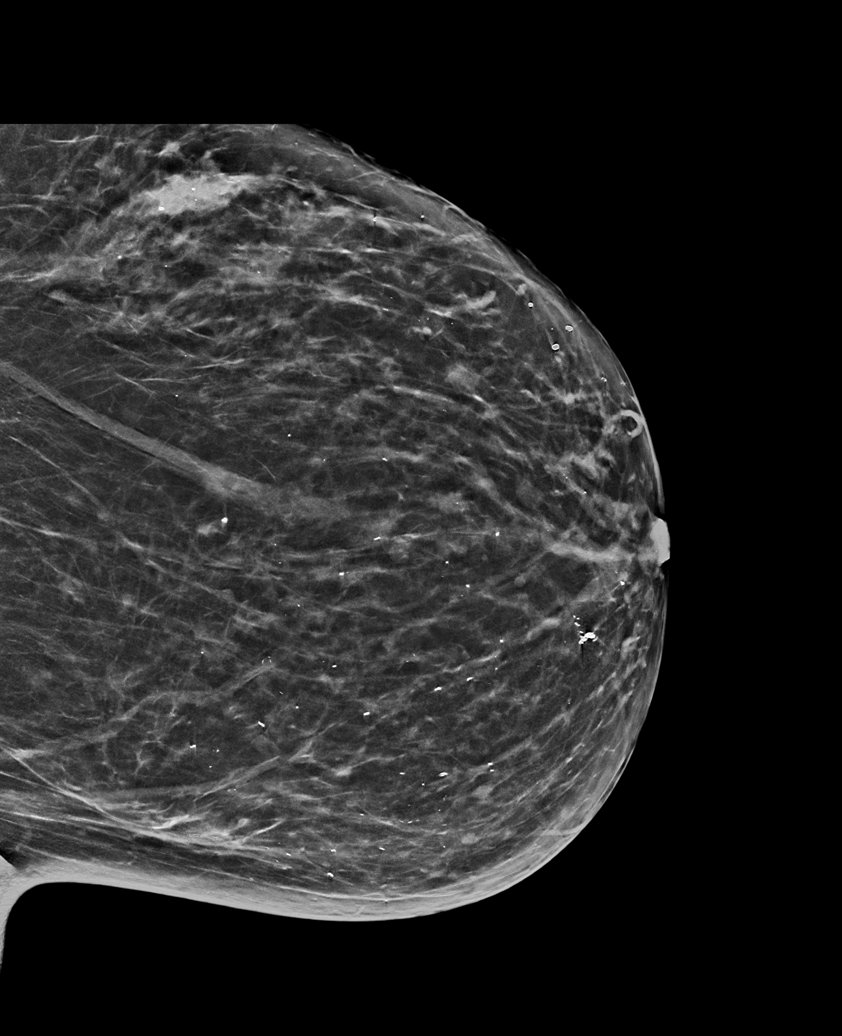

[L CC tomo · tomo slice 33/66.0]
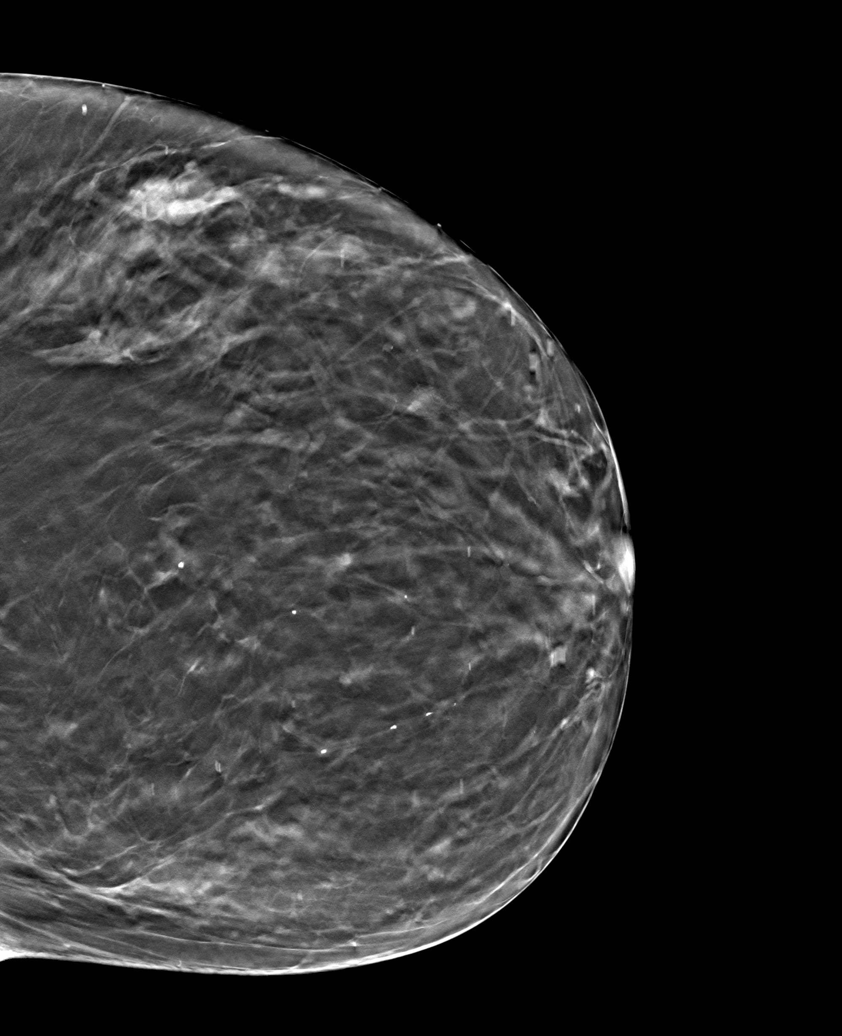

[6 of 30 positions shown; findings below may reference images not displayed]

ACR Breast Density Category c: The breast tissue is heterogeneously
dense, which may obscure small masses.
FINDINGS: There are no findings suspicious for malignancy. Images were
processed with CAD.
IMPRESSION: No mammographic evidence of malignancy. A result letter of this
screening mammogram will be mailed directly to the patient.

RECOMMENDATION:
Screening mammogram in one year. (Code:FT-U-LHB)

BI-RADS CATEGORY  1: Negative.

## 2021-09-11 ENCOUNTER — Other Ambulatory Visit (HOSPITAL_COMMUNITY)
Admission: RE | Admit: 2021-09-11 | Discharge: 2021-09-11 | Disposition: A | Discharge status: Home / Self Care | Payer: Medicare Other | Source: Ambulatory Visit | Attending: Obstetrics & Gynecology | Admitting: Obstetrics & Gynecology

## 2021-09-11 ENCOUNTER — Other Ambulatory Visit: Payer: Self-pay

## 2021-09-11 ENCOUNTER — Ambulatory Visit (INDEPENDENT_AMBULATORY_CARE_PROVIDER_SITE_OTHER): Payer: Medicare Other | Admitting: Obstetrics & Gynecology

## 2021-09-11 ENCOUNTER — Encounter: Payer: Self-pay | Admitting: Obstetrics & Gynecology

## 2021-09-11 VITALS — BP 126/74 | HR 84 | Ht 64.0 in | Wt 158.8 lb

## 2021-09-11 DIAGNOSIS — N939 Abnormal uterine and vaginal bleeding, unspecified: Secondary | ICD-10-CM | POA: Insufficient documentation

## 2021-09-11 DIAGNOSIS — R319 Hematuria, unspecified: Secondary | ICD-10-CM | POA: Diagnosis not present

## 2021-09-11 LAB — POCT URINALYSIS DIPSTICK
Glucose, UA: NEGATIVE
Ketones, UA: NEGATIVE
Nitrite, UA: NEGATIVE
Protein, UA: NEGATIVE

## 2021-09-11 NOTE — Progress Notes (Signed)
   GYN VISIT Patient name: Monique Wagner MRN [id number]  Date of birth: [birth year] Chief Complaint:   pinkish blood discharge  History of Present Illness:   Monique Wagner is a 80 y.o. G1P1001 PM, PH female being seen today for the following concerns:  -Vaginal spotting: She notes this am noted some pink on her pad and a little of brown spotting.  Prior to this, she has not had any bleeding.  She denies vaginal discharge or irritation.  Denies pelvic or abdominal pain.  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  Following treatment she did note considerable improvement of her symptoms.  Pt also s/p UTI treatment- denies dysuria or acute urinary concerns currently.  No LMP recorded. Patient has had a hysterectomy.   Review of Systems:   Pertinent items are noted in HPI Denies fever/chills, dizziness, headaches, visual disturbances, fatigue, shortness of breath, chest pain, abdominal pain, vomiting, bowel movements, urination, or intercourse unless otherwise stated above.  Pertinent History Reviewed:  Reviewed past medical,surgical, social, obstetrical and family history.  Reviewed problem list, medications and allergies. Physical Assessment:   Vitals:   09/11/21 1355  BP: 126/74  Pulse: 84  Weight: 158 lb 12.8 oz (72 kg)  Height: 5\' 4"  (1.626 m)  Body mass index is 27.26 kg/m.       Physical Examination:   General appearance: alert, well appearing, and in no distress  Psych: mood appropriate, normal affect  Skin: warm & dry   Cardiovascular: normal heart rate noted  Respiratory: normal respiratory effort, no distress  Abdomen: soft, non-tender, no rebound, no guarding  Pelvic: VULVA: normal appearing vulva with no masses, tenderness or lesions, VAGINA: normal appearing vagina with normal color, minimal yellow discharge.  With insertion of Q-tip- light spotting/blood noted.  Speculum inserted- no lesion or source of bleeding  seen  Extremities: no edema   Chaperone:  Dr.     Assessment & Plan  1) Vaginal bleeding -etiology of bleeding unclear- no discrete lesion or masses noted on pelvic exam -plan to r/o underlying infection both urinary and vaginal -Pt to monitor symptoms- if worsening bleeding- pt to call office for follow up   Orders Placed This Encounter  Procedures   Urine Culture   Urinalysis, Routine w reflex microscopic   POCT Urinalysis Dipstick    Return for 1 year annual (July).   10-22-2006, DO Attending Obstetrician & Gynecologist, [facility] for [facility]

## 2021-09-12 LAB — URINALYSIS, ROUTINE W REFLEX MICROSCOPIC
Bilirubin, UA: NEGATIVE
Glucose, UA: NEGATIVE
Ketones, UA: NEGATIVE
Nitrite, UA: NEGATIVE
Protein,UA: NEGATIVE
Specific Gravity, UA: 1.022 (ref 1.005–1.030)
Urobilinogen, Ur: 0.2 mg/dL (ref 0.2–1.0)
pH, UA: 6 (ref 5.0–7.5)

## 2021-09-12 LAB — MICROSCOPIC EXAMINATION
Bacteria, UA: NONE SEEN
Casts: NONE SEEN /lpf
WBC, UA: >30 /hpf — AB (ref 0–5)

## 2021-09-14 LAB — CERVICOVAGINAL ANCILLARY ONLY
Bacterial Vaginitis (gardnerella): NEGATIVE
Candida Glabrata: POSITIVE — AB
Candida Vaginitis: NEGATIVE
Comment: NEGATIVE
Comment: NEGATIVE
Comment: NEGATIVE

## 2021-09-14 LAB — URINE CULTURE

## 2021-11-23 ENCOUNTER — Other Ambulatory Visit: Payer: Self-pay | Admitting: Orthopedic Surgery

## 2021-11-23 DIAGNOSIS — M19012 Primary osteoarthritis, left shoulder: Secondary | ICD-10-CM

## 2021-12-11 ENCOUNTER — Other Ambulatory Visit: Payer: Self-pay

## 2021-12-11 ENCOUNTER — Ambulatory Visit
Admission: RE | Admit: 2021-12-11 | Discharge: 2021-12-11 | Disposition: A | Discharge status: Home / Self Care | Payer: Medicare Other | Source: Ambulatory Visit | Attending: Orthopedic Surgery | Admitting: Orthopedic Surgery

## 2021-12-11 DIAGNOSIS — M19012 Primary osteoarthritis, left shoulder: Secondary | ICD-10-CM

## 2021-12-28 NOTE — Progress Notes (Signed)
[facility]- Preparing for Total Shoulder Arthroplasty  °  °Before surgery, you can play an important role. Because skin is not sterile, your skin needs to be as free of germs as possible. You can reduce the number of germs on your skin by using the following products. °Benzoyl Peroxide Gel °Reduces the number of germs present on the skin °Applied twice a day to shoulder area starting two days before surgery   ° °================================================================== ° °Please follow these instructions carefully: ° °BENZOYL PEROXIDE 5% GEL ° °Please do not use if you have an allergy to benzoyl peroxide.   If your skin becomes reddened/irritated stop using the benzoyl peroxide. ° °Starting two days before surgery, apply as follows: °Apply benzoyl peroxide in the morning and at night. Apply after taking a shower. If you are not taking a shower clean entire shoulder front, back, and side along with the armpit with a clean wet washcloth. ° °Place a quarter-sized dollop on your shoulder and rub in thoroughly, making sure to cover the front, back, and side of your shoulder, along with the armpit.  ° °2 days before ____ AM   ____ PM              1 day before ____ AM   ____ PM °                        °Do this twice a day for two days.  (Last application is the night before surgery, AFTER using the CHG soap as described below). ° °Do NOT apply benzoyl peroxide gel on the day of surgery.  °

## 2021-12-28 NOTE — Progress Notes (Addendum)
Anesthesia Review: ? ?PCP: DR Iona Beard PT stated she hadto see for clearance.  Called and requested clearamce from [facility].  LVMM.   ?Cardiologist : ?Chest x-ray : ?EKG :04/21/21  ?Echo : ?Stress test: ?Cardiac Cath :  ?Activity level:  ?Sleep Study/ CPAP : ?Fasting Blood Sugar :      / Checks Blood Sugar -- times a day:   ?Blood Thinner/ Instructions /Last Dose: ?ASA / Instructions/ Last Dose :   ?

## 2021-12-28 NOTE — Progress Notes (Signed)
DUE TO COVID-19 ONLY 2 VISITOR IS ALLOWED TO COME WITH YOU AND STAY IN THE WAITING ROOM ONLY DURING PRE OP AND PROCEDURE DAY OF SURGERY.  4 VISITOR  MAY VISIT WITH YOU AFTER SURGERY IN YOUR PRIVATE ROOM DURING VISITING HOURS ONLY! ?YOU MAY HAVE ONE PERSON SPEND THE NITE WITH YOU IN YOUR ROOM AFTER SURGERY.   ? ? Your procedure is scheduled on:  ? 01/12/2022  ? [facility] to  [facility]  Entrance ? ? Report to admitting at       0515           AM ?DO NOT [place], PICTURE ID OR WALLET DAY OF SURGERY.  ?  ? ? Call this number if you have problems the morning of surgery [phone]  ? ? REMEMBER: NO  SOLID FOODS , CANDY, GUM OR MINTS AFTER MIDNITE THE NITE BEFORE SURGERY .       [facility] CLEAR LIQUIDS UNTIL       0430am         DAY OF SURGERY.      PLEASE FINISH ENSURE DRINK PER SURGEON ORDER  WHICH NEEDS TO BE COMPLETED AT        0430am     MORNING OF SURGERY.   ? ? ? ? ?CLEAR LIQUID DIET ? ? ?Foods Allowed      ?WATER ?BLACK COFFEE ( SUGAR OK, NO MILK, CREAM OR CREAMER) REGULAR AND DECAF  ?TEA ( SUGAR OK NO MILK, CREAM, OR CREAMER) REGULAR AND DECAF  ?PLAIN JELLO ( NO RED)  ?FRUIT ICES ( NO RED, NO FRUIT PULP)  ?POPSICLES ( NO RED)  ?JUICE- APPLE, WHITE GRAPE AND WHITE CRANBERRY  ?SPORT DRINK LIKE GATORADE ( NO RED)  ?CLEAR BROTH ( VEGETABLE , CHICKEN OR BEEF)                                                               ? ?    ? ?BRUSH YOUR TEETH MORNING OF SURGERY AND RINSE YOUR MOUTH OUT, NO CHEWING GUM CANDY OR MINTS. ?  ? ? Take these medicines the morning of surgery with A SIP OF WATER: eye drops as usual  ? ? ?DO NOT TAKE ANY DIABETIC MEDICATIONS DAY OF YOUR SURGERY ?                  ?            You may not have any metal on your body including hair pins and  ?            piercings  Do not wear jewelry, make-up, lotions, powders or perfumes, deodorant ?            Do not wear nail polish on your fingernails.   ?           IF YOU ARE A FEMALE AND WANT TO SHAVE UNDER ARMS OR LEGS PRIOR TO  SURGERY YOU MUST DO SO AT LEAST 48 HOURS PRIOR TO SURGERY.  ?            Men may shave face and neck. ? ? Do not bring valuables to the hospital. [facility] NOT ?            RESPONSIBLE  FOR VALUABLES. ? Contacts, dentures or bridgework may not be worn into surgery. ? Leave suitcase in the car. After surgery it may be brought to your room. ? ?  ? Patients discharged the day of surgery will not be allowed to drive home. IF YOU ARE HAVING SURGERY AND GOING HOME THE SAME DAY, YOU MUST HAVE AN ADULT TO DRIVE YOU HOME AND BE WITH YOU FOR 24 HOURS. YOU MAY GO HOME BY TAXI OR UBER OR ORTHERWISE, BUT AN ADULT MUST ACCOMPANY YOU HOME AND STAY WITH YOU FOR 24 HOURS. ?  ? ?            Please read over the following fact sheets you were given: ?_____________________________________________________________________ ? ?[facility] - Preparing for Surgery ?Before surgery, you can play an important role.  Because skin is not sterile, your skin needs to be as free of germs as possible.  You can reduce the number of germs on your skin by washing with CHG (chlorahexidine gluconate) soap before surgery.  CHG is an antiseptic cleaner which kills germs and bonds with the skin to continue killing germs even after washing. ?Please DO NOT use if you have an allergy to CHG or antibacterial soaps.  If your skin becomes reddened/irritated stop using the CHG and inform your nurse when you arrive at Short Stay. ?Do not shave (including legs and underarms) for at least 48 hours prior to the first CHG shower.  You may shave your face/neck. ?Please follow these instructions carefully: ? 1.  Shower with CHG Soap the night before surgery and the  morning of Surgery. ? 2.  If you choose to wash your hair, wash your hair first as usual with your  normal  shampoo. ? 3.  After you shampoo, rinse your hair and body thoroughly to remove the  shampoo.                           4.  Use CHG as you would any other liquid soap.  You can apply chg directly   to the skin and wash  ?                     Gently with a scrungie or clean washcloth. ? 5.  Apply the CHG Soap to your body ONLY FROM THE NECK DOWN.   Do not use on face/ open      ?                     Wound or open sores. Avoid contact with eyes, ears mouth and genitals (private parts).  ?                     Production manager,  Genitals (private parts) with your normal soap. ?            6.  Wash thoroughly, paying special attention to the area where your surgery  will be performed. ? 7.  Thoroughly rinse your body with warm water from the neck down. ? 8.  DO NOT shower/wash with your normal soap after using and rinsing off  the CHG Soap. ?               9.  Pat yourself dry with a clean towel. ?           10.  Wear clean pajamas. ?  11.  Place clean sheets on your bed the night of your first shower and do not  sleep with pets. ?Day of Surgery : ?Do not apply any lotions/deodorants the morning of surgery.  Please wear clean clothes to the hospital/surgery center. ? ?FAILURE TO FOLLOW THESE INSTRUCTIONS MAY RESULT IN THE CANCELLATION OF YOUR SURGERY ?PATIENT SIGNATURE_________________________________ ? ?NURSE SIGNATURE__________________________________ ? ?________________________________________________________________________  ? ? ?           ?

## 2022-01-01 ENCOUNTER — Encounter (HOSPITAL_COMMUNITY)
Admission: RE | Admit: 2022-01-01 | Discharge: 2022-01-01 | Disposition: A | Discharge status: Home / Self Care | Payer: Medicare Other | Source: Ambulatory Visit | Attending: Orthopedic Surgery | Admitting: Orthopedic Surgery

## 2022-01-01 ENCOUNTER — Other Ambulatory Visit: Payer: Self-pay

## 2022-01-01 ENCOUNTER — Encounter (HOSPITAL_COMMUNITY): Payer: Self-pay

## 2022-01-01 VITALS — BP 149/82 | HR 87 | Temp 98.0°F | Resp 18 | Ht 63.0 in | Wt 167.0 lb

## 2022-01-01 DIAGNOSIS — Z01812 Encounter for preprocedural laboratory examination: Secondary | ICD-10-CM | POA: Insufficient documentation

## 2022-01-01 DIAGNOSIS — Z01818 Encounter for other preprocedural examination: Secondary | ICD-10-CM

## 2022-01-01 HISTORY — DX: Unspecified glaucoma: H40.9

## 2022-01-01 LAB — CBC
HCT: 41.9 % (ref 36.0–46.0)
Hemoglobin: 13.4 g/dL (ref 12.0–15.0)
MCH: 30 pg (ref 26.0–34.0)
MCHC: 32 g/dL (ref 30.0–36.0)
MCV: 93.9 fL (ref 80.0–100.0)
Platelets: 234 10*3/uL (ref 150–400)
RBC: 4.46 MIL/uL (ref 3.87–5.11)
RDW: 13.2 % (ref 11.5–15.5)
WBC: 7.8 10*3/uL (ref 4.0–10.5)
nRBC: 0 % (ref 0.0–0.2)

## 2022-01-01 LAB — BASIC METABOLIC PANEL
Anion gap: 7 (ref 5–15)
BUN: 17 mg/dL (ref 8–23)
CO2: 29 mmol/L (ref 22–32)
Calcium: 9.6 mg/dL (ref 8.9–10.3)
Chloride: 102 mmol/L (ref 98–111)
Creatinine, Ser: 0.91 mg/dL (ref 0.44–1.00)
GFR, Estimated: >60 mL/min (ref >60)
Glucose, Bld: 96 mg/dL (ref 70–99)
Potassium: 3.9 mmol/L (ref 3.5–5.1)
Sodium: 138 mmol/L (ref 135–145)

## 2022-01-01 LAB — SURGICAL PCR SCREEN
MRSA, PCR: NEGATIVE
Staphylococcus aureus: NEGATIVE

## 2022-01-04 NOTE — Progress Notes (Signed)
Medical condition is preoperative testing ?

## 2022-01-09 NOTE — Progress Notes (Signed)
PT called nurse asking questions  about what meds to take am of surgery.  Reviewed with pt she is only to do eye drops as usual as listed on preop instrucitons sheet.  PT has preop instructions sheet at home.  PT aware not to take blood pressure medication am of surgery.  PAT voiced understanding.  ?

## 2022-01-11 NOTE — Progress Notes (Signed)
Clearance on chart DR Mirna Mires dated 01/10/22.  ?

## 2022-01-12 ENCOUNTER — Observation Stay (HOSPITAL_COMMUNITY)
Admission: RE | Admit: 2022-01-12 | Discharge: 2022-01-13 | Disposition: A | Discharge status: Home / Self Care | Payer: Medicare Other | Source: Ambulatory Visit | Attending: Orthopedic Surgery | Admitting: Orthopedic Surgery

## 2022-01-12 ENCOUNTER — Encounter (HOSPITAL_COMMUNITY): Payer: Self-pay | Admitting: Orthopedic Surgery

## 2022-01-12 ENCOUNTER — Ambulatory Visit (HOSPITAL_COMMUNITY): Payer: Medicare Other

## 2022-01-12 ENCOUNTER — Encounter (HOSPITAL_COMMUNITY)
Admission: RE | Disposition: A | Discharge status: Home / Self Care | Payer: Self-pay | Source: Ambulatory Visit | Attending: Orthopedic Surgery

## 2022-01-12 ENCOUNTER — Ambulatory Visit (HOSPITAL_COMMUNITY): Payer: Medicare Other | Admitting: Physician Assistant

## 2022-01-12 ENCOUNTER — Ambulatory Visit (HOSPITAL_BASED_OUTPATIENT_CLINIC_OR_DEPARTMENT_OTHER): Payer: Medicare Other | Admitting: Certified Registered"

## 2022-01-12 ENCOUNTER — Other Ambulatory Visit: Payer: Self-pay

## 2022-01-12 DIAGNOSIS — Z96653 Presence of artificial knee joint, bilateral: Secondary | ICD-10-CM | POA: Diagnosis not present

## 2022-01-12 DIAGNOSIS — I10 Essential (primary) hypertension: Secondary | ICD-10-CM | POA: Diagnosis not present

## 2022-01-12 DIAGNOSIS — M12812 Other specific arthropathies, not elsewhere classified, left shoulder: Secondary | ICD-10-CM | POA: Diagnosis not present

## 2022-01-12 DIAGNOSIS — I252 Old myocardial infarction: Secondary | ICD-10-CM | POA: Insufficient documentation

## 2022-01-12 DIAGNOSIS — Z79899 Other long term (current) drug therapy: Secondary | ICD-10-CM | POA: Insufficient documentation

## 2022-01-12 DIAGNOSIS — M19012 Primary osteoarthritis, left shoulder: Principal | ICD-10-CM | POA: Insufficient documentation

## 2022-01-12 DIAGNOSIS — Z8673 Personal history of transient ischemic attack (TIA), and cerebral infarction without residual deficits: Secondary | ICD-10-CM | POA: Diagnosis not present

## 2022-01-12 DIAGNOSIS — Z96612 Presence of left artificial shoulder joint: Secondary | ICD-10-CM

## 2022-01-12 HISTORY — PX: REVERSE SHOULDER ARTHROPLASTY: SHX5054

## 2022-01-12 LAB — CBC
HCT: 39.5 % (ref 36.0–46.0)
Hemoglobin: 12.9 g/dL (ref 12.0–15.0)
MCH: 30.9 pg (ref 26.0–34.0)
MCHC: 32.7 g/dL (ref 30.0–36.0)
MCV: 94.5 fL (ref 80.0–100.0)
Platelets: 202 10*3/uL (ref 150–400)
RBC: 4.18 MIL/uL (ref 3.87–5.11)
RDW: 13.1 % (ref 11.5–15.5)
WBC: 12.9 10*3/uL — ABNORMAL HIGH (ref 4.0–10.5)
nRBC: 0 % (ref 0.0–0.2)

## 2022-01-12 LAB — CREATININE, SERUM
Creatinine, Ser: 0.84 mg/dL (ref 0.44–1.00)
GFR, Estimated: >60 mL/min (ref >60)

## 2022-01-12 SURGERY — ARTHROPLASTY, SHOULDER, TOTAL, REVERSE
Anesthesia: General | Site: Shoulder | Laterality: Left

## 2022-01-12 MED ORDER — TELMISARTAN-HCTZ 40-12.5 MG PO TABS: 1.0000 | ORAL_TABLET | Freq: Every day | ORAL | Status: DC

## 2022-01-12 MED ORDER — LIDOCAINE HCL (PF) 2 % IJ SOLN
INTRAMUSCULAR | Status: AC
  Filled 2022-01-12: qty 5

## 2022-01-12 MED ORDER — ONDANSETRON HCL 4 MG/2ML IJ SOLN: 4.0000 mg | Freq: Four times a day (QID) | INTRAMUSCULAR | Status: DC | PRN

## 2022-01-12 MED ORDER — DOCUSATE SODIUM 100 MG PO CAPS
100.0000 mg | ORAL_CAPSULE | Freq: Two times a day (BID) | ORAL | Status: DC
  Administered 2022-01-12 – 2022-01-13 (×3): 100 mg via ORAL
  Filled 2022-01-12 (×3): qty 1

## 2022-01-12 MED ORDER — VANCOMYCIN HCL 1000 MG IV SOLR
INTRAVENOUS | Status: AC
  Filled 2022-01-12: qty 20

## 2022-01-12 MED ORDER — ACETAMINOPHEN 10 MG/ML IV SOLN: 1000.0000 mg | Freq: Once | INTRAVENOUS | Status: DC | PRN

## 2022-01-12 MED ORDER — FENTANYL CITRATE (PF) 100 MCG/2ML IJ SOLN
INTRAMUSCULAR | Status: DC | PRN
Start: 2022-01-12 — End: 2022-01-12
  Administered 2022-01-12: 50 ug via INTRAVENOUS
  Administered 2022-01-12 (×2): 25 ug via INTRAVENOUS

## 2022-01-12 MED ORDER — PHENYLEPHRINE HCL (PRESSORS) 10 MG/ML IV SOLN
INTRAVENOUS | Status: AC
  Filled 2022-01-12: qty 1

## 2022-01-12 MED ORDER — OXYCODONE HCL 5 MG/5ML PO SOLN: 5.0000 mg | Freq: Once | ORAL | Status: DC | PRN

## 2022-01-12 MED ORDER — PROPOFOL 10 MG/ML IV BOLUS
INTRAVENOUS | Status: AC
  Filled 2022-01-12: qty 20

## 2022-01-12 MED ORDER — MENTHOL 3 MG MT LOZG: 1.0000 | LOZENGE | OROMUCOSAL | Status: DC | PRN

## 2022-01-12 MED ORDER — VANCOMYCIN HCL 1000 MG IV SOLR
INTRAVENOUS | Status: DC | PRN
  Administered 2022-01-12: 1000 mg via TOPICAL

## 2022-01-12 MED ORDER — CEFAZOLIN SODIUM-DEXTROSE 1-4 GM/50ML-% IV SOLN
1.0000 g | Freq: Four times a day (QID) | INTRAVENOUS | Status: AC
  Administered 2022-01-12 – 2022-01-13 (×3): 1 g via INTRAVENOUS
  Filled 2022-01-12 (×3): qty 50

## 2022-01-12 MED ORDER — ONDANSETRON HCL 4 MG PO TABS
4.0000 mg | ORAL_TABLET | Freq: Four times a day (QID) | ORAL | Status: DC | PRN
  Filled 2022-01-12: qty 1

## 2022-01-12 MED ORDER — MORPHINE SULFATE (PF) 2 MG/ML IV SOLN: 0.5000 mg | INTRAVENOUS | Status: DC | PRN

## 2022-01-12 MED ORDER — ACETAMINOPHEN 325 MG PO TABS: 325.0000 mg | ORAL_TABLET | Freq: Four times a day (QID) | ORAL | Status: DC | PRN

## 2022-01-12 MED ORDER — NETARSUDIL-LATANOPROST 0.02-0.005 % OP SOLN
1.0000 [drp] | Freq: Every evening | OPHTHALMIC | Status: DC
  Filled 2022-01-12: qty 2.5

## 2022-01-12 MED ORDER — PHENYLEPHRINE HCL-NACL 20-0.9 MG/250ML-% IV SOLN
INTRAVENOUS | Status: DC | PRN
Start: 2022-01-12 — End: 2022-01-12
  Administered 2022-01-12: 30 ug/min via INTRAVENOUS

## 2022-01-12 MED ORDER — ISOPROPYL ALCOHOL 70 % SOLN
Status: AC
  Filled 2022-01-12: qty 480

## 2022-01-12 MED ORDER — TRANEXAMIC ACID-NACL 1000-0.7 MG/100ML-% IV SOLN
1000.0000 mg | INTRAVENOUS | Status: AC
  Administered 2022-01-12: 1000 mg via INTRAVENOUS
  Filled 2022-01-12: qty 100

## 2022-01-12 MED ORDER — SUGAMMADEX SODIUM 200 MG/2ML IV SOLN
INTRAVENOUS | Status: DC | PRN
  Administered 2022-01-12 (×2): 75 mg via INTRAVENOUS

## 2022-01-12 MED ORDER — FENTANYL CITRATE PF 50 MCG/ML IJ SOSY
PREFILLED_SYRINGE | INTRAMUSCULAR | Status: AC
  Filled 2022-01-12: qty 2

## 2022-01-12 MED ORDER — ATORVASTATIN CALCIUM 40 MG PO TABS
40.0000 mg | ORAL_TABLET | Freq: Every day | ORAL | Status: DC
  Administered 2022-01-12 – 2022-01-13 (×2): 40 mg via ORAL
  Filled 2022-01-12 (×2): qty 1

## 2022-01-12 MED ORDER — HYDROCODONE-ACETAMINOPHEN 5-325 MG PO TABS: 1.0000 | ORAL_TABLET | ORAL | Status: DC | PRN

## 2022-01-12 MED ORDER — PROPOFOL 10 MG/ML IV BOLUS
INTRAVENOUS | Status: DC | PRN
  Administered 2022-01-12: 100 mg via INTRAVENOUS

## 2022-01-12 MED ORDER — HYDROCHLOROTHIAZIDE 12.5 MG PO TABS
12.5000 mg | ORAL_TABLET | Freq: Every day | ORAL | Status: DC
  Administered 2022-01-13: 12.5 mg via ORAL
  Filled 2022-01-12 (×2): qty 1

## 2022-01-12 MED ORDER — 0.9 % SODIUM CHLORIDE (POUR BTL) OPTIME
TOPICAL | Status: DC | PRN
  Administered 2022-01-12: 1000 mL

## 2022-01-12 MED ORDER — ORAL CARE MOUTH RINSE: 15.0000 mL | Freq: Once | OROMUCOSAL | Status: AC

## 2022-01-12 MED ORDER — CEFAZOLIN SODIUM-DEXTROSE 2-4 GM/100ML-% IV SOLN
2.0000 g | INTRAVENOUS | Status: AC
  Administered 2022-01-12: 2 g via INTRAVENOUS
  Filled 2022-01-12: qty 100

## 2022-01-12 MED ORDER — LIDOCAINE 2% (20 MG/ML) 5 ML SYRINGE
INTRAMUSCULAR | Status: DC | PRN
  Administered 2022-01-12: 100 mg via INTRAVENOUS

## 2022-01-12 MED ORDER — STERILE WATER FOR IRRIGATION IR SOLN
Status: DC | PRN
  Administered 2022-01-12: 2000 mL

## 2022-01-12 MED ORDER — TRANEXAMIC ACID-NACL 1000-0.7 MG/100ML-% IV SOLN
1000.0000 mg | Freq: Once | INTRAVENOUS | Status: AC
  Administered 2022-01-12: 1000 mg via INTRAVENOUS
  Filled 2022-01-12: qty 100

## 2022-01-12 MED ORDER — PHENYLEPHRINE 40 MCG/ML (10ML) SYRINGE FOR IV PUSH (FOR BLOOD PRESSURE SUPPORT)
PREFILLED_SYRINGE | INTRAVENOUS | Status: DC | PRN
  Administered 2022-01-12 (×2): 80 ug via INTRAVENOUS
  Administered 2022-01-12: 120 ug via INTRAVENOUS
  Administered 2022-01-12: 80 ug via INTRAVENOUS

## 2022-01-12 MED ORDER — HYDROCODONE-ACETAMINOPHEN 7.5-325 MG PO TABS: 1.0000 | ORAL_TABLET | ORAL | Status: DC | PRN

## 2022-01-12 MED ORDER — ROCURONIUM BROMIDE 100 MG/10ML IV SOLN
INTRAVENOUS | Status: DC | PRN
  Administered 2022-01-12: 70 mg via INTRAVENOUS

## 2022-01-12 MED ORDER — CHLORHEXIDINE GLUCONATE 0.12 % MT SOLN
15.0000 mL | Freq: Once | OROMUCOSAL | Status: AC
  Administered 2022-01-12: 15 mL via OROMUCOSAL

## 2022-01-12 MED ORDER — ACETAMINOPHEN 500 MG PO TABS
500.0000 mg | ORAL_TABLET | Freq: Four times a day (QID) | ORAL | Status: AC
  Administered 2022-01-12 – 2022-01-13 (×4): 500 mg via ORAL
  Filled 2022-01-12 (×4): qty 1

## 2022-01-12 MED ORDER — PHENOL 1.4 % MT LIQD: 1.0000 | OROMUCOSAL | Status: DC | PRN

## 2022-01-12 MED ORDER — ENOXAPARIN SODIUM 40 MG/0.4ML IJ SOSY
40.0000 mg | PREFILLED_SYRINGE | INTRAMUSCULAR | Status: DC
  Administered 2022-01-13: 40 mg via SUBCUTANEOUS
  Filled 2022-01-12: qty 0.4

## 2022-01-12 MED ORDER — ONDANSETRON HCL 4 MG/2ML IJ SOLN
4.0000 mg | Freq: Once | INTRAMUSCULAR | Status: AC | PRN
  Administered 2022-01-12: 4 mg via INTRAVENOUS

## 2022-01-12 MED ORDER — FENTANYL CITRATE PF 50 MCG/ML IJ SOSY: 25.0000 ug | PREFILLED_SYRINGE | INTRAMUSCULAR | Status: DC | PRN

## 2022-01-12 MED ORDER — TRAMADOL HCL 50 MG PO TABS: 50.0000 mg | ORAL_TABLET | Freq: Four times a day (QID) | ORAL | 0 refills | Status: DC | PRN

## 2022-01-12 MED ORDER — OXYCODONE HCL 5 MG PO TABS: 5.0000 mg | ORAL_TABLET | Freq: Once | ORAL | Status: DC | PRN

## 2022-01-12 MED ORDER — METOCLOPRAMIDE HCL 5 MG/ML IJ SOLN: 5.0000 mg | Freq: Three times a day (TID) | INTRAMUSCULAR | Status: DC | PRN

## 2022-01-12 MED ORDER — LACTATED RINGERS IV SOLN: INTRAVENOUS | Status: DC

## 2022-01-12 MED ORDER — IRBESARTAN 150 MG PO TABS
150.0000 mg | ORAL_TABLET | Freq: Every day | ORAL | Status: DC
  Administered 2022-01-13: 150 mg via ORAL
  Filled 2022-01-12: qty 1

## 2022-01-12 MED ORDER — ONDANSETRON HCL 4 MG PO TABS: 4.0000 mg | ORAL_TABLET | Freq: Three times a day (TID) | ORAL | 1 refills | Status: DC | PRN

## 2022-01-12 MED ORDER — ROCURONIUM BROMIDE 10 MG/ML (PF) SYRINGE
PREFILLED_SYRINGE | INTRAVENOUS | Status: AC
  Filled 2022-01-12: qty 10

## 2022-01-12 MED ORDER — FENTANYL CITRATE PF 50 MCG/ML IJ SOSY
PREFILLED_SYRINGE | INTRAMUSCULAR | Status: AC
  Filled 2022-01-12: qty 3

## 2022-01-12 MED ORDER — BUPIVACAINE HCL (PF) 0.5 % IJ SOLN
INTRAMUSCULAR | Status: DC | PRN
  Administered 2022-01-12: 15 mL via PERINEURAL

## 2022-01-12 MED ORDER — ONDANSETRON HCL 4 MG/2ML IJ SOLN
INTRAMUSCULAR | Status: AC
  Filled 2022-01-12: qty 2

## 2022-01-12 MED ORDER — METOCLOPRAMIDE HCL 5 MG PO TABS
5.0000 mg | ORAL_TABLET | Freq: Three times a day (TID) | ORAL | Status: DC | PRN
  Filled 2022-01-12: qty 2

## 2022-01-12 MED ORDER — ONDANSETRON HCL 4 MG/2ML IJ SOLN
INTRAMUSCULAR | Status: DC | PRN
  Administered 2022-01-12: 4 mg via INTRAVENOUS

## 2022-01-12 MED ORDER — BUPIVACAINE LIPOSOME 1.3 % IJ SUSP
INTRAMUSCULAR | Status: DC | PRN
  Administered 2022-01-12: 10 mL

## 2022-01-12 SURGICAL SUPPLY — 67 items
AID PSTN UNV HD RSTRNT DISP (MISCELLANEOUS)
BAG COUNTER SPONGE SURGICOUNT (BAG) IMPLANT
BAG SPEC THK2 15X12 ZIP CLS (MISCELLANEOUS) ×1
BAG SPNG CNTER NS LX DISP (BAG)
BAG ZIPLOCK 12X15 (MISCELLANEOUS) ×2 IMPLANT
BASEPLATE AUG FULL 24X10X2 LAT (Plate) ×1 IMPLANT
BASEPLATE MOD POST AUGM MGS 20 (Plate) ×1 IMPLANT
BIT DRILL FLUTED 3.0 STRL (BIT) ×1 IMPLANT
BLADE SAG 18X100X1.27 (BLADE) ×3 IMPLANT
CALIBRATOR GLENOID VIP 5-D (SYSTAGENIX WOUND MANAGEMENT) ×1 IMPLANT
COVER BACK TABLE 60X90IN (DRAPES) ×2 IMPLANT
COVER SURGICAL LIGHT HANDLE (MISCELLANEOUS) ×2 IMPLANT
CUP SUT UNIV REVERS 36 NEUTRAL (Cup) ×1 IMPLANT
DRAPE ORTHO SPLIT 77X108 STRL (DRAPES) ×4
DRAPE SHEET LG 3/4 BI-LAMINATE (DRAPES) ×3 IMPLANT
DRAPE SURG 17X11 SM STRL (DRAPES) ×2 IMPLANT
DRAPE SURG ORHT 6 SPLT 77X108 (DRAPES) ×2 IMPLANT
DRAPE TOP 10253 STERILE (DRAPES) ×2 IMPLANT
DRAPE U-SHAPE 47X51 STRL (DRAPES) ×3 IMPLANT
DRSG AQUACEL AG ADV 3.5X 6 (GAUZE/BANDAGES/DRESSINGS) IMPLANT
DRSG AQUACEL AG ADV 3.5X10 (GAUZE/BANDAGES/DRESSINGS) ×2 IMPLANT
DURAPREP 26ML APPLICATOR (WOUND CARE) IMPLANT
ELECT REM PT RETURN 15FT ADLT (MISCELLANEOUS) ×2 IMPLANT
FACESHIELD WRAPAROUND (MASK) ×4 IMPLANT
FACESHIELD WRAPAROUND OR TEAM (MASK) ×1 IMPLANT
GLENOSPHERE 36 +4 LAT/24 (Joint) ×1 IMPLANT
GLOVE BIO SURGEON STRL SZ7.5 (GLOVE) ×8 IMPLANT
GLOVE BIOGEL PI IND STRL 8 (GLOVE) ×2 IMPLANT
GLOVE BIOGEL PI INDICATOR 8 (GLOVE) ×2
GOWN STRL REUS W/ TWL XL LVL3 (GOWN DISPOSABLE) ×4 IMPLANT
GOWN STRL REUS W/TWL XL LVL3 (GOWN DISPOSABLE)
KIT BASIN OR (CUSTOM PROCEDURE TRAY) ×3 IMPLANT
KIT TURNOVER KIT A (KITS) ×1 IMPLANT
LINER HUMERAL 36 +3MM SM (Shoulder) ×1 IMPLANT
MANIFOLD NEPTUNE II (INSTRUMENTS) ×2 IMPLANT
NDL TAPERED W/ NITINOL LOOP (MISCELLANEOUS) IMPLANT
NEEDLE TAPERED W/ NITINOL LOOP (MISCELLANEOUS) IMPLANT
NS IRRIG 1000ML POUR BTL (IV SOLUTION) ×2 IMPLANT
PACK SHOULDER (CUSTOM PROCEDURE TRAY) ×2 IMPLANT
PIN NITINOL TARGETER 2.8 (PIN) ×1 IMPLANT
PROTECTOR NERVE ULNAR (MISCELLANEOUS) ×3 IMPLANT
RESTRAINT HEAD UNIVERSAL NS (MISCELLANEOUS) IMPLANT
SCREW PERI LOCK 5.5X16 (Screw) ×2 IMPLANT
SCREW PERIPHERAL 5.5X28 LOCK (Screw) ×1 IMPLANT
SCREW PERIPHERAL NL 4.5X28 (Screw) ×1 IMPLANT
SLING ARM FOAM STRAP MED (SOFTGOODS) IMPLANT
SLING ARM IMMOBILIZER LRG (SOFTGOODS) ×1 IMPLANT
SMARTMIX MINI TOWER (MISCELLANEOUS)
SPONGE T-LAP 18X18 ~~LOC~~+RFID (SPONGE) ×2 IMPLANT
SPONGE T-LAP 4X18 ~~LOC~~+RFID (SPONGE) ×1 IMPLANT
STEM HUMERAL MOD SZ 5 135 DEG (Stem) ×1 IMPLANT
STRIP CLOSURE SKIN 1/2X4 (GAUZE/BANDAGES/DRESSINGS) ×2 IMPLANT
SUCTION FRAZIER HANDLE 10FR (MISCELLANEOUS) ×2
SUCTION TUBE FRAZIER 10FR DISP (MISCELLANEOUS) ×1 IMPLANT
SUT FIBERWIRE #2 38 T-5 BLUE (SUTURE)
SUT MON AB 3-0 SH 27 (SUTURE) ×2
SUT MON AB 3-0 SH27 (SUTURE) ×1 IMPLANT
SUT VIC AB 0 CT1 36 (SUTURE) ×3 IMPLANT
SUT VIC AB 1 CT1 36 (SUTURE) ×2 IMPLANT
SUT VIC AB 2-0 CT1 27 (SUTURE) ×2
SUT VIC AB 2-0 CT1 TAPERPNT 27 (SUTURE) ×1 IMPLANT
SUTURE FIBERWR #2 38 T-5 BLUE (SUTURE) IMPLANT
SUTURE TAPE 1.3 40 TPR END (SUTURE) ×2 IMPLANT
SUTURETAPE 1.3 40 TPR END (SUTURE) ×4
TOWEL OR 17X26 10 PK STRL BLUE (TOWEL DISPOSABLE) ×3 IMPLANT
TOWER SMARTMIX MINI (MISCELLANEOUS) IMPLANT
TUBE SUCTION HIGH CAP CLEAR NV (SUCTIONS) ×2 IMPLANT

## 2022-01-12 NOTE — Plan of Care (Signed)

## 2022-01-12 NOTE — Transfer of Care (Signed)
Immediate Anesthesia Transfer of Care Note ? ?Patient: Monique Wagner ? ?Procedure(s) Performed: REVERSE SHOULDER ARTHROPLASTY (Left: Shoulder) ? ?Patient Location: PACU ? ?Anesthesia Type:General and Regional ? ?Level of Consciousness: drowsy and patient cooperative ? ?Airway & Oxygen Therapy: Patient Spontanous Breathing and Patient connected to face mask oxygen ? ?Post-op Assessment: Report given to RN and Post -op Vital signs reviewed and stable ? ?Post vital signs: Reviewed and stable ? ?Last Vitals:  ?Vitals Value Taken Time  ?BP 165/86 01/12/22 0922  ?Temp    ?Pulse 67 01/12/22 0923  ?Resp 20 01/12/22 0923  ?SpO2 100 % 01/12/22 0923  ?Vitals shown include unvalidated device data. ? ?Last Pain:  ?Vitals:  ? 01/12/22 0546  ?TempSrc:   ?PainSc: 0-No pain  ?   ? ?Patients Stated Pain Goal: 3 (01/12/22 0546) ? ?Complications: No notable events documented. ?

## 2022-01-12 NOTE — Brief Op Note (Signed)
01/12/2022 ? ?9:04 AM ? ?PATIENT:  Monique Wagner  81 y.o. female ? ?PRE-OPERATIVE DIAGNOSIS:  Left shoulder osteoarthritis ? ?POST-OPERATIVE DIAGNOSIS:  Left shoulder rotator cuff arthropathy ? ?PROCEDURE:  Procedure(s) with comments: ?REVERSE SHOULDER ARTHROPLASTY (Left) - 150 ? ?SURGEON:  Surgeon(s) and Role: ?   Aundria Rud, Noah Delaine, MD - Primary ? ?PHYSICIAN ASSISTANT: Dion Saucier, PA-C ? ?ANESTHESIA:   regional and general ? ?EBL:  150 mL  ? ?BLOOD ADMINISTERED:none ? ?DRAINS: none  ? ?LOCAL MEDICATIONS USED:  NONE ? ?SPECIMEN:  No Specimen ? ?DISPOSITION OF SPECIMEN:  N/A ? ?COUNTS:  YES ? ?TOURNIQUET:  * No tourniquets in log * ? ?DICTATION: .Note written in EPIC ? ?PLAN OF CARE: admit to observation ? ?PATIENT DISPOSITION:  PACU - hemodynamically stable. ?  ?Delay start of Pharmacological VTE agent (>24hrs) due to surgical blood loss or risk of bleeding: not applicable ? ?

## 2022-01-12 NOTE — Op Note (Signed)
01/12/2022 ? ?9:06 AM ? ?PATIENT:  Augustine Radar   ? ?PRE-OPERATIVE DIAGNOSIS:  Left shoulder osteoarthritis ? ?POST-OPERATIVE DIAGNOSIS: Left shoulder rotator cuff arthropathy ? ?PROCEDURE:  REVERSE SHOULDER ARTHROPLASTY ? ?SURGEON:  Yolonda Kida, MD ? ?ASSISTANT: Dion Saucier, PA-C ? ?Assistant attestation: PA Sharon Seller present for the entire procedure.  Participated in all portions. ? ?ANESTHESIA:   General with interscalene ? ?ESTIMATED BLOOD LOSS: 150 cc ? ?PREOPERATIVE INDICATIONS:  NAKEDRA MEKEEL is a  81 y.o. female with a diagnosis of Left shoulder osteoarthritis who failed conservative measures and elected for surgical management.   ? ?The risks benefits and alternatives were discussed with the patient preoperatively including but not limited to the risks of infection, bleeding, nerve injury, cardiopulmonary complications, the need for revision surgery, dislocation, brachial plexus palsy, incomplete relief of pain, among others, and the patient was willing to proceed. ? ?OPERATIVE IMPLANTS:  ?Arthrex reverse arthroplasty system size 5 stem with a size 24 mm +2 lateralized with a 10 degree full augment glenoid baseplate with a 20 mm central post and 1 inferior nonlocking screw and 3 peripheral locking screws.  36 mm +4 mm lateralized glenosphere  ?+3 polyethylene liner and a standard humeral tray. ? ? ?OPERATIVE FINDINGS:  ?Advanced rotator cuff arthropathy with significant calcific tendinopathy of the subscapularis.  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  There was significant wear on the glenoid and humeral head with large inferior osteophytes on the humerus.  Flattened and dysplastic glenoid with retroversion of about 20 degrees. ? ?OPERATIVE PROCEDURE: The patient was brought to the operating room and placed in the supine position. General anesthesia was administered. IV antibiotics were given. A Foley was placed. Time out was performed. The upper extremity was prepped and  draped in usual sterile fashion. The patient was in a beachchair position. Deltopectoral approach was carried out. The biceps was tenotomized. The subscapularis was released off of the bone.  ? ?I then performed circumferential releases of the humerus, and then dislocated the head, and then reamed with the reamer to the above named size. ? ?I then applied the jig, and cut the humeral head in 30? of retroversion, and then turned my attention to the glenoid. ? ?Deep retractors were placed, and I resected the labrum, and then placed a guidepin into the center position on the glenoid, using the VIP preoperative planning system, with slight inferior inclination. I then reamed over the guidepin, and this created a small metaphyseal cancellus blush inferiorly, removing just the cartilage to the subchondral bone superiorly. The base plate was selected and impacted place with the 10 degree full augment oriented at approximately the 1:30 position on the glenoid face.  We utilized a 20 mm central post that was impacted into place as a unit with the augmented baseplate, and then 1 inferior nonlocking screw.  Superiorly, anteriorly and posteriorly we placed locking screws. ? ?I then turned my attention to the glenosphere, and impacted this into place, placing the central set screw. ? ?The glenoid sphere was completely seated, and had engagement of the [facility] taper. I then turned my attention back to the humerus. ? ?I sequentially broached, and then trialed, and was found to restore soft tissue tension, and it had 2 finger tightness. Therefore the above named components were selected. The shoulder felt stable throughout functional motion. ? ?I then impacted the real prosthesis into place, as well as the real humeral tray, and reduced the shoulder. The shoulder had excellent motion, and  was stable, and I irrigated the wounds copiously.  ? ?We then repaired the subscapularis back to the anterior humeral tray via the suture cup  suture holes. ? ?I then irrigated the shoulder copiously once more, repaired the deltopectoral interval, and placed 1 g of vancomycin powder, utilizing a #1 Vicryl for the rotator interval, followed by subcutaneous Vicryl, then monocryl for the skin,  with Steri-Strips and sterile gauze for the skin. The patient was awakened and returned back in stable and satisfactory condition. There no complications and they tolerated the procedure well.  All counts were correct x2. ? ?Disposition: ? ?Ms. Razzaq will be admitted for overnight observation and to see occupational therapy.  She will be nonweightbearing to the left upper extremity.  She may use the arm for activities of daily living however.  She will follow-up with me in 2 weeks with 2 x-ray views of the left shoulder.  Likely discharge home tomorrow lunch. ? ?

## 2022-01-12 NOTE — Anesthesia Procedure Notes (Signed)
Anesthesia Regional Block: Interscalene brachial plexus block  ? ?Pre-Anesthetic Checklist: , timeout performed,  Correct Patient, Correct Site, Correct Laterality,  Correct Procedure, Correct Position, site marked,  Risks and benefits discussed,  Surgical consent,  Pre-op evaluation,  At surgeon's request and post-op pain management ? ?Laterality: Left ? ?Prep: chloraprep     ?  ?Needles:  ?Injection technique: Single-shot ? ?Needle Type: Echogenic Stimulator Needle   ? ? ?Needle Length: 9cm  ? ? ? ? ?Additional Needles: ? ? ?Procedures:,,,, ultrasound used (permanent image in chart),,    ? ?Nerve Stimulator or Paresthesia:  ?Response: 0.6 mA ? ?Additional Responses:  ? ?Narrative:  ?Start time: 01/12/2022 6:49 AM ?End time: 01/12/2022 6:58 AM ?Injection made incrementally with aspirations every 5 mL. ? ?Performed by: Personally  ?Anesthesiologist: Myrtie Soman, MD ? ?Additional Notes: ?Patient tolerated the procedure well without complications ? ? ? ?

## 2022-01-12 NOTE — H&P (Signed)
? ?ORTHOPAEDIC H and P ? ?REQUESTING PHYSICIAN: Yolonda Kida, MD ? ?PCP:  Mirna Mires, MD ? ?Chief Complaint: Left shoulder pain ? ?HPI: ?Monique Wagner is a 81 y.o. female who complains of left shoulder pain and dysfunction over the last few years.  She is here today for reverse arthroplasty.  No new complaints at this time. ? ?Past Medical History:  ?Diagnosis Date  ? Anemia   ? Arthritis   ? Diverticulitis   ? Dyspnea   ? Glaucoma   ? HTN (hypertension)   ? Hyperlipidemia   ? Stroke [facility])   ? slight stroke 50 years ago - no deficits   ? UTI (urinary tract infection) 12/14/2020  ? ?Past Surgical History:  ?Procedure Laterality Date  ? ABDOMINAL HYSTERECTOMY    ? BREAST SURGERY    ? KNEE ARTHROPLASTY Left 05/18/2020  ? Procedure: COMPUTER ASSISTED TOTAL KNEE ARTHROPLASTY;  Surgeon: Samson Frederic, MD;  Location: [facility] ORS;  Service: Orthopedics;  Laterality: Left;  ? KNEE ARTHROPLASTY Right 04/26/2021  ? Procedure: COMPUTER ASSISTED TOTAL KNEE ARTHROPLASTY;  Surgeon: Samson Frederic, MD;  Location: [facility] ORS;  Service: Orthopedics;  Laterality: Right;  ? ?Social History  ? ?Socioeconomic History  ? Marital status: Widowed  ?  Spouse name: Not on file  ? Number of children: 1  ? Years of education: 28  ? Highest education level: Not on file  ?Occupational History  ? Not on file  ?Tobacco Use  ? Smoking status: Never  ? Smokeless tobacco: Never  ?Vaping Use  ? Vaping Use: Never used  ?Substance and Sexual Activity  ? Alcohol use: No  ? Drug use: No  ? Sexual activity: Not Currently  ?  Birth control/protection: Surgical  ?  Comment: hyst  ?Other Topics Concern  ? Not on file  ?Social History Narrative  ? Not on file  ? ?Social Determinants of Health  ? ?Financial Resource Strain: Not on file  ?Food Insecurity: Not on file  ?Transportation Needs: Not on file  ?Physical Activity: Not on file  ?Stress: Not on file  ?Social Connections: Not on file  ? ?Family History  ?Problem Relation Age of Onset  ? Arthritis Other    ? Cancer Other   ? Diabetes Other   ? Heart disease Other   ? ?Allergies  ?Allergen Reactions  ? Latex Rash  ? Tape Rash  ? ?Prior to Admission medications   ?Medication Sig Start Date End Date Taking? Authorizing Provider  ?acetaminophen (TYLENOL) 325 MG tablet Take 650 mg by mouth every 6 (six) hours as needed for moderate pain.   Yes [provider]  ?atorvastatin (LIPITOR) 40 MG tablet TAKE 1 TABLET BY MOUTH EVERY DAY 05/01/21  Yes Patwardhan, Manish J, MD  ?Carboxymethylcell-Hypromellose (GENTEAL) 0.25-0.3 % GEL Place 1 drop into both eyes as needed (dry eyes).   Yes [provider]  ?cholecalciferol (VITAMIN D3) 25 MCG (1000 UNIT) tablet Take 1,000 Units by mouth daily.   Yes [provider]  ?CRANBERRY PO Take 1 tablet by mouth daily.   Yes [provider]  ?DANDELION PO Take 1 capsule by mouth daily.   Yes [provider]  ?dorzolamide-timolol (COSOPT) 22.3-6.8 MG/ML ophthalmic solution Place 1 drop into both eyes 2 (two) times daily.   Yes [provider]  ?Milk Thistle 150 MG CAPS Take 150 mg by mouth daily.   Yes [provider]  ?Multiple Vitamins-Minerals (MULTIVITAMIN PO) Take 1 tablet by mouth daily.  Yes [provider]  ?Netarsudil-Latanoprost (ROCKLATAN) 0.02-0.005 % SOLN Place 1 drop into both eyes every evening.   Yes [provider]  ?Omega-3 Fatty Acids (FISH OIL) 1200 MG CAPS Take 1,200 mg by mouth daily.   Yes [provider]  ?telmisartan-hydrochlorothiazide (MICARDIS HCT) 40-12.5 MG tablet Take 1 tablet by mouth daily.    Yes [provider]  ? ?No results found. ? ?Positive ROS: All other systems have been reviewed and were otherwise negative with the exception of those mentioned in the HPI and as above. ? ?Physical Exam: ?General: Alert, no acute distress ?Cardiovascular: No pedal edema ?Respiratory: No cyanosis, no use of accessory musculature ?GI: No organomegaly, abdomen is soft and  non-tender ?Skin: No lesions in the area of chief complaint ?Neurologic: Sensation intact distally ?Psychiatric: Patient is competent for consent with normal mood and affect ?Lymphatic: No axillary or cervical lymphadenopathy ? ?MUSCULOSKELETAL: Left upper extremity is warm and well-perfused with no open wounds. ? ?Assessment: ?Left shoulder osteoarthritis ? ?Plan: ?-Plan to proceed today with reverse arthroplasty left shoulder for end-stage osteoarthritis with rotator cuff insufficiency.  We again discussed the risk and benefits of bleeding, infection, damage to surrounding nerves and vessels, dislocation, fracture, need for revision surgery, stiffness, and the risk of anesthesia.  She has provided informed consent. ? ?-Plan for admission overnight postoperatively. ? ? ? ?Nicholes Stairs, MD ?Cell [phone]  ? ? ?01/12/2022 ?7:19 AM ? ?

## 2022-01-12 NOTE — Discharge Instructions (Signed)
Orthopedic surgery discharge instructions:  -Maintain postoperative bandage until follow-up appointment.  This is waterproof, and you may begin showering on postoperative day #3.  Do not submerge underwater.  Maintain that bandage until your follow-up appointment in 2 weeks.  -No lifting over 2 pounds with operateive arm.  You may use the arm immediately for activities of daily living such as bathing, washing your face and brushing your teeth, eating, and getting dressed.  Otherwise maintain your sling when you are out of the house and sleeping.  -Apply ice liberally to the shoulder throughout the day.  For mild to moderate pain use Tylenol and Advil as needed around-the-clock.  For breakthrough pain use oxycodone as necessary.  -You will return to see Dr. Abed Schar in the office in 2 weeks for routine postoperative check with x-rays.  

## 2022-01-12 NOTE — Anesthesia Procedure Notes (Signed)
Anesthesia Procedure Image    

## 2022-01-12 NOTE — Anesthesia Procedure Notes (Signed)
Procedure Name: Intubation ?Date/Time: 01/12/2022 7:33 AM ?Performed by: Gwyndolyn Saxon, CRNA ?Pre-anesthesia Checklist: Patient identified, Emergency Drugs available, Suction available and Patient being monitored ?Patient Re-evaluated:Patient Re-evaluated prior to induction ?Oxygen Delivery Method: Circle system utilized ?Preoxygenation: Pre-oxygenation with 100% oxygen ?Induction Type: IV induction ?Ventilation: Mask ventilation without difficulty ?Laryngoscope Size: Sabra Heck and 2 ?Grade View: Grade I ?Tube type: Oral ?Tube size: 7.0 mm ?Number of attempts: 1 ?Airway Equipment and Method: Stylet ?Placement Confirmation: ETT inserted through vocal cords under direct vision, positive ETCO2 and breath sounds checked- equal and bilateral ?Secured at: 21 cm ?Tube secured with: Tape ?Dental Injury: Teeth and Oropharynx as per pre-operative assessment  ? ? ? ? ?

## 2022-01-12 NOTE — Anesthesia Postprocedure Evaluation (Signed)
Anesthesia Post Note ? ?Patient: Monique Wagner ? ?Procedure(s) Performed: REVERSE SHOULDER ARTHROPLASTY (Left: Shoulder) ? ?  ? ?Patient location during evaluation: PACU ?Anesthesia Type: General ?Level of consciousness: awake and alert ?Pain management: pain level controlled ?Vital Signs Assessment: post-procedure vital signs reviewed and stable ?Respiratory status: spontaneous breathing, nonlabored ventilation, respiratory function stable and patient connected to nasal cannula oxygen ?Cardiovascular status: blood pressure returned to baseline and stable ?Postop Assessment: no apparent nausea or vomiting ?Anesthetic complications: no ? ? ?No notable events documented. ? ?Last Vitals:  ?Vitals:  ? 01/12/22 0939 01/12/22 0945  ?BP:  (!) 156/81  ?Pulse: 68 69  ?Resp: (!) 22 (!) 21  ?Temp: 36.4 ?C   ?SpO2: 100% 100%  ?  ?Last Pain:  ?Vitals:  ? 01/12/22 0945  ?TempSrc:   ?PainSc: 0-No pain  ? ? ?  ?  ?  ?  ?  ?  ? ?Krisa Blattner S ? ? ? ? ?

## 2022-01-12 NOTE — Anesthesia Preprocedure Evaluation (Signed)
Anesthesia Evaluation  ?Patient identified by MRN, date of birth, ID band ?Patient awake ? ? ? ?Reviewed: ?Allergy & Precautions, NPO status , Patient's Chart, lab work & pertinent test results ? ?Airway ?Mallampati: II ? ?TM Distance: >3 FB ?Neck ROM: Full ? ? ? Dental ? ?(+) Partial Upper, Dental Advisory Given ?  ?Pulmonary ?neg pulmonary ROS,  ?  ?Pulmonary exam normal ?breath sounds clear to auscultation ? ? ? ? ? ? Cardiovascular ?hypertension, Pt. on medications ?Normal cardiovascular exam ?Rhythm:Regular Rate:Normal ? ? ?  ?Neuro/Psych ?CVA negative psych ROS  ? GI/Hepatic ?negative GI ROS, Neg liver ROS,   ?Endo/Other  ?negative endocrine ROS ? Renal/GU ?negative Renal ROS  ?negative genitourinary ?  ?Musculoskeletal ?negative musculoskeletal ROS ?(+)  ? Abdominal ?  ?Peds ?negative pediatric ROS ?(+)  Hematology ?negative hematology ROS ?(+)   ?Anesthesia Other Findings ? ? Reproductive/Obstetrics ?negative OB ROS ? ?  ? ? ? ? ? ? ? ? ? ? ? ? ? ?  ?  ? ? ? ? ? ? ? ? ?Anesthesia Physical ?Anesthesia Plan ? ?ASA: 3 ? ?Anesthesia Plan: General  ? ?Post-op Pain Management: Regional block*  ? ?Induction: Intravenous ? ?PONV Risk Score and Plan: 3 and Ondansetron, Dexamethasone and Treatment may vary due to age or medical condition ? ?Airway Management Planned: Oral ETT ? ?Additional Equipment:  ? ?Intra-op Plan:  ? ?Post-operative Plan: Extubation in OR ? ?Informed Consent: I have reviewed the patients History and Physical, chart, labs and discussed the procedure including the risks, benefits and alternatives for the proposed anesthesia with the patient or authorized representative who has indicated his/her understanding and acceptance.  ? ? ? ?Dental advisory given ? ?Plan Discussed with: CRNA and Surgeon ? ?Anesthesia Plan Comments:   ? ? ? ? ? ? ?Anesthesia Quick Evaluation ? ?

## 2022-01-13 ENCOUNTER — Other Ambulatory Visit: Payer: Self-pay

## 2022-01-13 DIAGNOSIS — M19012 Primary osteoarthritis, left shoulder: Secondary | ICD-10-CM | POA: Diagnosis not present

## 2022-01-13 LAB — BASIC METABOLIC PANEL
Anion gap: 8 (ref 5–15)
BUN: 15 mg/dL (ref 8–23)
CO2: 27 mmol/L (ref 22–32)
Calcium: 8.8 mg/dL — ABNORMAL LOW (ref 8.9–10.3)
Chloride: 105 mmol/L (ref 98–111)
Creatinine, Ser: 0.96 mg/dL (ref 0.44–1.00)
GFR, Estimated: 59 mL/min — ABNORMAL LOW (ref >60)
Glucose, Bld: 132 mg/dL — ABNORMAL HIGH (ref 70–99)
Potassium: 3.3 mmol/L — ABNORMAL LOW (ref 3.5–5.1)
Sodium: 140 mmol/L (ref 135–145)

## 2022-01-13 LAB — CBC
HCT: 35.6 % — ABNORMAL LOW (ref 36.0–46.0)
Hemoglobin: 11.4 g/dL — ABNORMAL LOW (ref 12.0–15.0)
MCH: 30 pg (ref 26.0–34.0)
MCHC: 32 g/dL (ref 30.0–36.0)
MCV: 93.7 fL (ref 80.0–100.0)
Platelets: 183 10*3/uL (ref 150–400)
RBC: 3.8 MIL/uL — ABNORMAL LOW (ref 3.87–5.11)
RDW: 13.2 % (ref 11.5–15.5)
WBC: 8.5 10*3/uL (ref 4.0–10.5)
nRBC: 0 % (ref 0.0–0.2)

## 2022-01-13 NOTE — Progress Notes (Signed)
Subjective: ?1 Day Post-Op Procedure(s) (LRB): ?REVERSE SHOULDER ARTHROPLASTY (Left) ?Seen in rounds for Dr. Aundria Rud ?Patient reports pain as mild.   ?No events overnight ?No CP/ SOB, N/V ? ? ?Objective: ?Vital signs in last 24 hours: ?Temp:  [96.9 ?F (36.1 ?C)-100.8 ?F (38.2 ?C)] 98.2 ?F (36.8 ?C) (04/15 0805) ?Pulse Rate:  [56-93] 91 (04/15 0402) ?Resp:  [14-28] 18 (04/15 0402) ?BP: (93-177)/(55-90) 114/64 (04/15 0402) ?SpO2:  [80 %-100 %] 98 % (04/15 0743) ? ?Intake/Output from previous day: ?04/14 0701 - 04/15 0700 ?In: 1540 [P.O.:390; I.V.:800; IV Piggyback:350] ?Out: 150 [Blood:150] ?Intake/Output this shift: ?Total I/O ?In: 240 [P.O.:240] ?Out: -  ? ?Recent Labs  ?  01/12/22 ?1211 01/13/22 ?0342  ?HGB 12.9 11.4*  ? ?Recent Labs  ?  01/12/22 ?1211 01/13/22 ?0342  ?WBC 12.9* 8.5  ?RBC 4.18 3.80*  ?HCT 39.5 35.6*  ?PLT 202 183  ? ?Recent Labs  ?  01/12/22 ?1211 01/13/22 ?0342  ?NA  --  140  ?K  --  3.3*  ?CL  --  105  ?CO2  --  27  ?BUN  --  15  ?CREATININE 0.84 0.96  ?GLUCOSE  --  132*  ?CALCIUM  --  8.8*  ? ?No results for input(s): LABPT, INR in the last 72 hours. ? ?Neurologically intact ?Neurovascular intact ?Sensation intact distally ?Intact pulses distally ?Dorsiflexion/Plantar flexion intact ?No cellulitis present ?Compartment soft ? ? ?Assessment/Plan: ?1 Day Post-Op Procedure(s) (LRB): ?REVERSE SHOULDER ARTHROPLASTY (Left) ?Advance diet ?Patient is okay to be d/c this am  ?She is NWB on left UE okay to use for ADLs ?Willl follow up in the office in 2 weeks with Dr. Aundria Rud  ?Meds have already been sent to pharmacy  ? ? ?Cherie Dark, PA-C ?[facility] ?[phone] ?01/13/2022, 9:04 AM ? ?

## 2022-01-13 NOTE — Progress Notes (Signed)
Pt stable at time of d/c. NO needs at time time of d/c. Pt dressing clean, dry, and intact.  ?

## 2022-01-13 NOTE — Evaluation (Signed)
Occupational Therapy Evaluation ?Patient Details ?Name: Monique Wagner ?MRN: [id number] ?DOB: 05-26-1941 ?Today's Date: 01/13/2022 ? ? ?History of Present Illness Patient is a 81 year old female who underwent elective L reverse total shoulder replacement on 4/14.PMH: CVA, HLD, HTN, dyspnea, OA, L TKA, R TKA.  ? ?Clinical Impression ?  ?s/p shoulder replacement without functional use of left non dominant upper extremity secondary to effects of surgery and interscalene block and shoulder precautions. Therapist provided education and instruction to patient and sister in regards to exercises, precautions, positioning, donning upper extremity clothing and bathing while maintaining shoulder precautions, ice and edema management and donning/doffing sling. Patient was noted to have increased confusion with ROM for ADLs not being exercises with sister able to verbalize understanding.Patient and sister verbalized understanding and demonstrated as needed.patient recommended to have caregiver support at home for at least first few days to get settled. Patient's sister reported she was going to live with her for some time. Patient needed assistance to donn shirt, underwear, pants, socks and shoes and provided with instruction on compensatory strategies to perform ADLs. Patient to follow up with MD for further therapy needs.  ?  ?   ? ?Recommendations for follow up therapy are one component of a multi-disciplinary discharge planning process, led by the attending physician.  Recommendations may be updated based on patient status, additional functional criteria and insurance authorization.  ? ?Follow Up Recommendations ? Follow physician's recommendations for discharge plan and follow up therapies  ?  ?Assistance Recommended at Discharge Frequent or constant Supervision/Assistance  ?Patient can return home with the following A little help with walking and/or transfers;A little help with bathing/dressing/bathroom;Assistance with  cooking/housework;Direct supervision/assist for financial management;Assist for transportation;Help with stairs or ramp for entrance;Direct supervision/assist for medications management ? ?  ?Functional Status Assessment ? Patient has had a recent decline in their functional status and demonstrates the ability to make significant improvements in function in a reasonable and predictable amount of time.  ?Equipment Recommendations ? None recommended by OT  ?  ?Recommendations for Other Services   ? ? ?  ?Precautions / Restrictions Precautions ?Precautions: Shoulder ?Type of Shoulder Precautions: AROM elbow, hand and wrist OK. shoulder PROM/AROM ok FF 0-90 degrees, ABDuction 0-60 degrees, external rotation 0-30 degrees for ADLs only ?Shoulder Interventions: Off for dressing/bathing/exercises;At all times;Shoulder sling/immobilizer ?Precaution Booklet Issued: Yes (comment) (handouts) ?Restrictions ?Weight Bearing Restrictions: Yes ?LUE Weight Bearing: Non weight bearing  ? ?  ? ?Mobility Bed Mobility ?  ?  ?  ?  ?  ?  ?  ?General bed mobility comments: patient was standing moving around in room with sister when therapist entered. ?  ? ?Transfers ?  ?  ?  ?  ?  ?  ?  ?  ?  ?  ?  ? ?  ?Balance Overall balance assessment: No apparent balance deficits (not formally assessed) ?  ?  ?  ?  ?  ?  ?  ?  ?  ?  ?  ?  ?  ?  ?  ?  ?  ?  ?   ? ?ADL either performed or assessed with clinical judgement  ? ?ADL Overall ADL's : Needs assistance/impaired ?Eating/Feeding: Set up;Sitting ?  ?Grooming: Therapist, nutritional;Set up;Sitting ?  ?Upper Body Bathing: Sitting;Moderate assistance ?  ?Lower Body Bathing: Sit to/from stand;Sitting/lateral leans;Moderate assistance ?  ?Upper Body Dressing : Maximal assistance;Sitting;Cueing for UE precautions ?Upper Body Dressing Details (indicate cue type and reason): with sister able to  verbalize understanding. ?Lower Body Dressing: Moderate assistance;Sit to/from stand ?Lower Body Dressing Details  (indicate cue type and reason): with sister able to offer support. patient reported not wearing pants at home. patient was educated on same proccess for wearing underwear. patient and sister verbalized understanding. ?Toilet Transfer: Supervision/safety;Ambulation ?Toilet Transfer Details (indicate cue type and reason): no AD ?Toileting- Clothing Manipulation and Hygiene: Moderate assistance;Sit to/from stand ?  ?  ?  ?Functional mobility during ADLs: Supervision/safety ?   ? ? ? ?Vision Baseline Vision/History: 1 Wears glasses ?Patient Visual Report: No change from baseline ?   ?   ?Perception   ?  ?Praxis   ?  ? ?Pertinent Vitals/Pain Pain Assessment ?Pain Assessment: Faces ?Faces Pain Scale: Hurts a little bit ?Pain Location: R shoulder with donning shirt. no pain in LUE ?Pain Descriptors / Indicators: Discomfort ?Pain Intervention(s): Monitored during session  ? ? ? ?Hand Dominance Right ?  ?Extremity/Trunk Assessment Upper Extremity Assessment ?Upper Extremity Assessment: RUE deficits/detail;LUE deficits/detail ?RUE Deficits / Details: WFL ?LUE Deficits / Details: ROM restrictions with patient able to range hand and wrist WFL. not much movement noted for elbow/shoudler area at this time. patient extensivley eduated on ROM for ADLs. ?  ?Lower Extremity Assessment ?Lower Extremity Assessment: Overall WFL for tasks assessed ?  ?Cervical / Trunk Assessment ?Cervical / Trunk Assessment: Normal ?  ?Communication Communication ?Communication: No difficulties ?  ?Cognition Arousal/Alertness: Awake/alert ?Behavior During Therapy: Flat affect ?Overall Cognitive Status: Difficult to assess ?  ?  ?  ?  ?  ?  ?  ?  ?  ?  ?  ?  ?  ?  ?  ?  ?General Comments: patinet was appropirate with responses but noted to have increased difficulty with understanding ROM allowances for ADLs. patient's sister was present and able to understand eduation provided. ?  ?  ?General Comments    ? ?  ?Exercises   ?  ?Shoulder Instructions  Shoulder Instructions ?Donning/doffing shirt without moving shoulder: Caregiver independent with task (within ROM restrictions provided.) ?Method for sponge bathing under operated UE: Caregiver independent with task ?Donning/doffing sling/immobilizer: Caregiver independent with task ?Correct positioning of sling/immobilizer: Caregiver independent with task ?ROM for elbow, wrist and digits of operated UE: Caregiver independent with task;Supervision/safety ?Sling wearing schedule (on at all times/off for ADL's): Caregiver independent with task ?Proper positioning of operated UE when showering: Caregiver independent with task ?Positioning of UE while sleeping: Caregiver independent with task  ? ? ?Home Living Family/patient expects to be discharged to:: Private residence ?Living Arrangements: Alone ?  ?  ?  ?  ?  ?  ?  ?  ?  ?  ?  ?  ?  ?  ?  ?Additional Comments: pt is planning to stay at her sisters while she first recovers ?  ? ?  ?Prior Functioning/Environment Prior Level of Function : Independent/Modified Independent ?  ?  ?  ?  ?  ?  ?  ?  ?  ? ?  ?  ?OT Problem List: Decreased range of motion;Impaired UE functional use;Pain;Impaired balance (sitting and/or standing);Decreased safety awareness;Decreased cognition;Decreased activity tolerance;Decreased knowledge of precautions ?  ?   ?OT Treatment/Interventions:    ?  ?OT Goals(Current goals can be found in the care plan section) Acute Rehab OT Goals ?Patient Stated Goal: to follow all rules from MD ?OT Goal Formulation: With patient/family ?Time For Goal Achievement: 01/27/22 ?Potential to Achieve Goals: Good  ?OT Frequency:   ?  ? ?Co-evaluation   ?  ?  ?  ?  ? ?  ?  AM-PAC OT "6 Clicks" Daily Activity     ?Outcome Measure Help from another person eating meals?: A Little ?Help from another person taking care of personal grooming?: A Little ?Help from another person toileting, which includes using toliet, bedpan, or urinal?: A Lot ?Help from another person bathing  (including washing, rinsing, drying)?: A Lot ?Help from another person to put on and taking off regular upper body clothing?: A Lot ?Help from another person to put on and taking off regular lower body clothing?: A Lot ?6

## 2022-01-15 ENCOUNTER — Encounter (HOSPITAL_COMMUNITY): Payer: Self-pay | Admitting: Orthopedic Surgery

## 2022-01-17 NOTE — Discharge Summary (Signed)
Patient ID: ?Monique Wagner ?MRN: [id number] ?DOB/AGE: 81-Apr-1942 81 y.o. ? ?Admit date: 01/12/2022 ?Discharge date: 01/13/2022 ? ?Primary Diagnosis: Left shoulder rotator cuff arthropathy Admission Diagnoses: Status post left total shoulder arthroplasty  ?Past Medical History:  ?Diagnosis Date  ? Anemia   ? Arthritis   ? Diverticulitis   ? Dyspnea   ? Glaucoma   ? HTN (hypertension)   ? Hyperlipidemia   ? Stroke [facility])   ? slight stroke 50 years ago - no deficits   ? UTI (urinary tract infection) 12/14/2020  ? ?Discharge Diagnoses:   ?Principal Problem: ?  S/P reverse total shoulder arthroplasty, left ? ?Estimated body mass index is 29.58 kg/m? as calculated from the following: ?  Height as of this encounter: 5\' 3"  (1.6 m). ?  Weight as of this encounter: 75.8 kg. ? ?Procedure:  ?Procedure(s) (LRB): ?REVERSE SHOULDER ARTHROPLASTY (Left)  ? ?Consults: None ? ?HPI: Monique Wagner is a 81 year old female who was initially evaluated in the office at [facility] for chronic left shoulder pain and dysfunction. After a trial of conservative treatment she elected to proceed with reverse total shoulder arthroplasty for definitive treatment. She presented to the operative room and underwent surgeon 01/12/22. She was admitted for post operative monitoring overnight.  ? ?Laboratory Data: ?Admission on 01/12/2022, Discharged on 01/13/2022  ?Component Date Value Ref Range Status  ? WBC 01/12/2022 12.9 (H)  4.0 - 10.5 K/uL Final  ? RBC 01/12/2022 4.18  3.87 - 5.11 MIL/uL Final  ? Hemoglobin 01/12/2022 12.9  12.0 - 15.0 g/dL Final  ? HCT 01/12/2022 39.5  36.0 - 46.0 % Final  ? MCV 01/12/2022 94.5  80.0 - 100.0 fL Final  ? MCH 01/12/2022 30.9  26.0 - 34.0 pg Final  ? MCHC 01/12/2022 32.7  30.0 - 36.0 g/dL Final  ? RDW 01/12/2022 13.1  11.5 - 15.5 % Final  ? Platelets 01/12/2022 202  150 - 400 K/uL Final  ? nRBC 01/12/2022 0.0  0.0 - 0.2 % Final  ? Performed at [facility], [place] [address] [place], [place] [address]  ?  Creatinine, Ser 01/12/2022 0.84  0.44 - 1.00 mg/dL Final  ? GFR, Estimated 01/12/2022 >60  >60 mL/min Final  ? Comment: (NOTE) ?Calculated using the CKD-EPI Creatinine Equation (2021) ?Performed at [facility], [place] [address] ?[place], [place] [address] ?  ? Sodium 01/13/2022 140  135 - 145 mmol/L Final  ? Potassium 01/13/2022 3.3 (L)  3.5 - 5.1 mmol/L Final  ? Chloride 01/13/2022 105  98 - 111 mmol/L Final  ? CO2 01/13/2022 27  22 - 32 mmol/L Final  ? Glucose, Bld 01/13/2022 132 (H)  70 - 99 mg/dL Final  ? Glucose reference range applies only to samples taken after fasting for at least 8 hours.  ? BUN 01/13/2022 15  8 - 23 mg/dL Final  ? Creatinine, Ser 01/13/2022 0.96  0.44 - 1.00 mg/dL Final  ? Calcium 01/13/2022 8.8 (L)  8.9 - 10.3 mg/dL Final  ? GFR, Estimated 01/13/2022 59 (L)  >60 mL/min Final  ? Comment: (NOTE) ?Calculated using the CKD-EPI Creatinine Equation (2021) ?  ? Anion gap 01/13/2022 8  5 - 15 Final  ? Performed at [facility], [place] [address] [place], [place] [address]  ? WBC 01/13/2022 8.5  4.0 - 10.5 K/uL Final  ? RBC 01/13/2022 3.80 (L)  3.87 - 5.11 MIL/uL Final  ? Hemoglobin 01/13/2022 11.4 (L)  12.0 - 15.0 g/dL  Final  ? HCT 01/13/2022 35.6 (L)  36.0 - 46.0 % Final  ? MCV 01/13/2022 93.7  80.0 - 100.0 fL Final  ? MCH 01/13/2022 30.0  26.0 - 34.0 pg Final  ? MCHC 01/13/2022 32.0  30.0 - 36.0 g/dL Final  ? RDW 01/13/2022 13.2  11.5 - 15.5 % Final  ? Platelets 01/13/2022 183  150 - 400 K/uL Final  ? nRBC 01/13/2022 0.0  0.0 - 0.2 % Final  ? Performed at [facility], [place] [address] [place], [place] [address]  ?Hospital Outpatient Visit on 01/01/2022  ?Component Date Value Ref Range Status  ? MRSA, PCR 01/01/2022 NEGATIVE  NEGATIVE Final  ? Staphylococcus aureus 01/01/2022 NEGATIVE  NEGATIVE Final  ? Comment: (NOTE) ?The Xpert SA Assay (FDA approved for NASAL specimens in patients 4 ?years of age and older), is one component of a  comprehensive ?surveillance program. It is not intended to diagnose infection nor to ?guide or monitor treatment. ?Performed at [facility], [place] [address] ?[place], [place] [address] ?  ? Sodium 01/01/2022 138  135 - 145 mmol/L Final  ? Potassium 01/01/2022 3.9  3.5 - 5.1 mmol/L Final  ? Chloride 01/01/2022 102  98 - 111 mmol/L Final  ? CO2 01/01/2022 29  22 - 32 mmol/L Final  ? Glucose, Bld 01/01/2022 96  70 - 99 mg/dL Final  ? Glucose reference range applies only to samples taken after fasting for at least 8 hours.  ? BUN 01/01/2022 17  8 - 23 mg/dL Final  ? Creatinine, Ser 01/01/2022 0.91  0.44 - 1.00 mg/dL Final  ? Calcium 01/01/2022 9.6  8.9 - 10.3 mg/dL Final  ? GFR, Estimated 01/01/2022 >60  >60 mL/min Final  ? Comment: (NOTE) ?Calculated using the CKD-EPI Creatinine Equation (2021) ?  ? Anion gap 01/01/2022 7  5 - 15 Final  ? Performed at [facility], [place] [address] [place], [place] [address]  ? WBC 01/01/2022 7.8  4.0 - 10.5 K/uL Final  ? RBC 01/01/2022 4.46  3.87 - 5.11 MIL/uL Final  ? Hemoglobin 01/01/2022 13.4  12.0 - 15.0 g/dL Final  ? HCT 01/01/2022 41.9  36.0 - 46.0 % Final  ? MCV 01/01/2022 93.9  80.0 - 100.0 fL Final  ? MCH 01/01/2022 30.0  26.0 - 34.0 pg Final  ? MCHC 01/01/2022 32.0  30.0 - 36.0 g/dL Final  ? RDW 01/01/2022 13.2  11.5 - 15.5 % Final  ? Platelets 01/01/2022 234  150 - 400 K/uL Final  ? nRBC 01/01/2022 0.0  0.0 - 0.2 % Final  ? Performed at [facility], [place] [address], [place], [place] [address]  ?  ? ?X-Rays:DG Shoulder Left Port ? ?Result Date: 01/12/2022 ?CLINICAL DATA:  Postoperative left total shoulder arthroplasty. EXAM: LEFT SHOULDER COMPARISON:  Left shoulder radiographs 05/12/2012 FINDINGS: Interval reverse total left shoulder arthroplasty. No perihardware lucency is seen to indicate hardware failure or loosening. There is a mineralization measuring approximately 9 mm overlying the axillary pouch, similar to  prior but possibly slightly larger than 7 mm previously. Moderate joint space narrowing and peripheral osteophytosis degenerative changes of the acromioclavicular joint. Expected postoperative changes including intra-articular air and left shoulder soft tissue swelling. No acute fracture or dislocation. Moderate multilevel degenerative bridging osteophytes of the thoracic spine. IMPRESSION: Interval reverse total left shoulder arthroplasty without evidence of hardware failure. Electronically Signed   By: Yvonne Kendall M.D.   On: 01/12/2022 10:31   ? ?  EKG: ?Orders placed or performed in visit on 04/21/21  ? EKG 12-Lead  ?  ? ?Hospital Course: Monique Wagner is a 81 y.o. who was admitted to Hospital. They were brought to the operating room on 01/12/2022 and underwent Procedure(s): ?REVERSE SHOULDER ARTHROPLASTY.  Patient tolerated the procedure well and was later transferred to the recovery room and then to the orthopaedic floor for postoperative care.  They were given PO and IV analgesics for pain control following their surgery.  They were given 24 hours of postoperative antibiotics of  ?Anti-infectives (From admission, onward)  ? ? Start     Dose/Rate Route Frequency Ordered Stop  ? 01/12/22 1400  ceFAZolin (ANCEF) IVPB 1 g/50 mL premix       ? 1 g ?100 mL/hr over 30 Minutes Intravenous Every 6 hours 01/12/22 0933 01/13/22 1041  ? 01/12/22 0803  vancomycin (VANCOCIN) powder  Status:  Discontinued       ?   As needed 01/12/22 0803 01/12/22 1154  ? 01/12/22 0600  ceFAZolin (ANCEF) IVPB 2g/100 mL premix       ? 2 g ?200 mL/hr over 30 Minutes Intravenous On call to O.R. 01/12/22 [id number] 01/12/22 0803  ? ?  ? and started on DVT prophylaxis in the form of Lovenox.     Discharge planning consulted to help with postop disposition and equipment needs.  Patient had an uneventful night on the evening of surgery.  They had an OT evaluation on day one.  The patient had progressed with therapy and meeting their goals.    Patient was  seen in rounds and was ready to go home. ? ? ?Diet: Regular diet ?Activity:NWB LUE ?Follow-up:in 2 weeks ?Disposition - Home ?Discharged Condition: good ? ? ?Discharge Instructions   ? ? Call MD / Call 911   C

## 2022-05-07 ENCOUNTER — Ambulatory Visit
Admission: EM | Admit: 2022-05-07 | Discharge: 2022-05-07 | Disposition: A | Discharge status: Home / Self Care | Payer: Medicare Other | Attending: Family Medicine | Admitting: Family Medicine

## 2022-05-07 ENCOUNTER — Other Ambulatory Visit: Payer: Self-pay

## 2022-05-07 DIAGNOSIS — R001 Bradycardia, unspecified: Secondary | ICD-10-CM | POA: Diagnosis not present

## 2022-05-07 DIAGNOSIS — J069 Acute upper respiratory infection, unspecified: Secondary | ICD-10-CM | POA: Diagnosis not present

## 2022-05-07 MED ORDER — FLUTICASONE PROPIONATE 50 MCG/ACT NA SUSP: 1.0000 | Freq: Two times a day (BID) | NASAL | 2 refills | Status: DC

## 2022-05-07 NOTE — ED Provider Notes (Signed)
[facility]    CSN: [id number] Arrival date & time: 05/07/22  1114      History   Chief Complaint Chief Complaint  Patient presents with   Cough   Fever    HPI Monique Wagner is a 81 y.o. female.   Patient presenting today with 1 day history of cough, sore throat, possible low-grade fever last night.  Feeling very similar today.  Denies chest pain, shortness of breath, headache, abdominal pain, nausea vomiting or diarrhea.  Known exposure to COVID-19 about 4 5 days ago and states that she feels very similar to last time she had COVID about 2 years ago.  Taking Coricidin, Vicks vapor rub with mild temporary relief of symptoms.  No known history of chronic pulmonary disease.   Past Medical History:  Diagnosis Date   Anemia    Arthritis    Diverticulitis    Dyspnea    Glaucoma    HTN (hypertension)    Hyperlipidemia    Stroke (HCC)    slight stroke 50 years ago - no deficits    UTI (urinary tract infection) 12/14/2020    Patient Active Problem List   Diagnosis Date Noted   S/P reverse total shoulder arthroplasty, left 01/12/2022   Osteoarthritis of right knee 04/26/2021   Pre-op evaluation 04/21/2021   Yeast vaginitis 03/23/2021   Vaginal discharge 03/23/2021   Osteoarthritis of left knee 05/18/2020   Anemia    Lower abdominal pain    Diverticulitis 08/28/2017   Pain in the chest    Chest pain 09/03/2014   HTN (hypertension) 09/03/2014   Mixed hyperlipidemia 09/03/2014   Pain in joint, shoulder region 07/10/2012   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   Osteoarthrosis, unspecified whether generalized or localized, lower leg 04/21/2008   KNEE PAIN 04/21/2008   HIGH BLOOD PRESSURE 04/20/2008    Past Surgical History:  Procedure Laterality Date   ABDOMINAL HYSTERECTOMY     BREAST SURGERY     KNEE ARTHROPLASTY Left 05/18/2020   Procedure: COMPUTER ASSISTED TOTAL KNEE ARTHROPLASTY;  Surgeon: Samson Frederic, MD;  Location: [facility];  Service:  Orthopedics;  Laterality: Left;   KNEE ARTHROPLASTY Right 04/26/2021   Procedure: COMPUTER ASSISTED TOTAL KNEE ARTHROPLASTY;  Surgeon: Samson Frederic, MD;  Location: [facility];  Service: Orthopedics;  Laterality: Right;   REVERSE SHOULDER ARTHROPLASTY Left 01/12/2022   Procedure: REVERSE SHOULDER ARTHROPLASTY;  Surgeon: Yolonda Kida, MD;  Location: [facility];  Service: Orthopedics;  Laterality: Left;  150    OB History     Gravida  1   Para  1   Term  1   Preterm      AB      Living  1      SAB      IAB      Ectopic      Multiple      Live Births  1            Home Medications    Prior to Admission medications   Medication Sig Start Date End Date Taking? Authorizing Provider  fluticasone (FLONASE) 50 MCG/ACT nasal spray Place 1 spray into both nostrils 2 (two) times daily. 05/07/22  Yes Particia Nearing, PA-C  acetaminophen (TYLENOL) 325 MG tablet Take 650 mg by mouth every 6 (six) hours as needed for moderate pain.    [provider]  atorvastatin (LIPITOR) 40 MG tablet TAKE 1 TABLET BY MOUTH EVERY DAY 05/01/21   Patwardhan, Manish J,  MD  Carboxymethylcell-Hypromellose (GENTEAL) 0.25-0.3 % GEL Place 1 drop into both eyes as needed (dry eyes).    [provider]  cholecalciferol (VITAMIN D3) 25 MCG (1000 UNIT) tablet Take 1,000 Units by mouth daily.    [provider]  CRANBERRY PO Take 1 tablet by mouth daily.    [provider]  DANDELION PO Take 1 capsule by mouth daily.    [provider]  dorzolamide-timolol (COSOPT) 22.3-6.8 MG/ML ophthalmic solution Place 1 drop into both eyes 2 (two) times daily.    [provider]  Milk Thistle 150 MG CAPS Take 150 mg by mouth daily.    [provider]  Multiple Vitamins-Minerals (MULTIVITAMIN PO) Take 1 tablet by mouth daily.     [provider]  Netarsudil-Latanoprost (ROCKLATAN) 0.02-0.005 % SOLN Place 1 drop into both eyes every evening.     [provider]  Omega-3 Fatty Acids (FISH OIL) 1200 MG CAPS Take 1,200 mg by mouth daily.    [provider]  ondansetron (ZOFRAN) 4 MG tablet Take 1 tablet (4 mg total) by mouth every 8 (eight) hours as needed for nausea or vomiting. 01/12/22 01/12/23  Nicholes Stairs, MD  telmisartan-hydrochlorothiazide (MICARDIS HCT) 40-12.5 MG tablet Take 1 tablet by mouth daily.     [provider]  traMADol (ULTRAM) 50 MG tablet Take 1 tablet (50 mg total) by mouth every 6 (six) hours as needed for severe pain or moderate pain. 01/12/22 01/12/23  Nicholes Stairs, MD    Family History Family History  Problem Relation Age of Onset   Arthritis Other    Cancer Other    Diabetes Other    Heart disease Other     Social History Social History   Tobacco Use   Smoking status: Never   Smokeless tobacco: Never  Vaping Use   Vaping Use: Never used  Substance Use Topics   Alcohol use: No   Drug use: No     Allergies   Latex and Tape   Review of Systems Review of Systems Per HPI  Physical Exam Triage Vital Signs ED Triage Vitals  Enc Vitals Group     BP 05/07/22 1136 132/61     Pulse Rate 05/07/22 1136 (!) 48     Resp 05/07/22 1136 16     Temp 05/07/22 1136 98.9 F (37.2 C)     Temp Source 05/07/22 1136 Oral     SpO2 05/07/22 1136 95 %     Weight --      Height --      Head Circumference --      Peak Flow --      Pain Score 05/07/22 1135 0     Pain Loc --      Pain Edu? --      Excl. in [place]? --    Orthostatic VS for the past 24 hrs:  BP- Lying Pulse- Lying BP- Sitting Pulse- Sitting BP- Standing at 0 minutes Pulse- Standing at 0 minutes  05/07/22 1152 115/60 (!) 48 134/70 (!) 48 146/76 50    Updated Vital Signs BP 132/61 (BP Location: Right Arm)   Pulse (!) 48   Temp 98.9 F (37.2 C) (Oral)   Resp 16   SpO2 95%   Visual Acuity Right Eye Distance:   Left Eye Distance:   Bilateral Distance:    Right Eye Near:   Left Eye Near:     Bilateral Near:     Physical Exam  Vitals and nursing note reviewed.  Constitutional:      Appearance: Normal appearance.  HENT:     Head: Atraumatic.     Right Ear: Tympanic membrane and external ear normal.     Left Ear: Tympanic membrane and external ear normal.     Nose: Rhinorrhea present.     Mouth/Throat:     Mouth: Mucous membranes are moist.     Pharynx: Posterior oropharyngeal erythema present. No oropharyngeal exudate.  Eyes:     Extraocular Movements: Extraocular movements intact.     Conjunctiva/sclera: Conjunctivae normal.  Cardiovascular:     Rate and Rhythm: Regular rhythm. Bradycardia present.     Heart sounds: Normal heart sounds.  Pulmonary:     Effort: Pulmonary effort is normal.     Breath sounds: Normal breath sounds. No wheezing or rales.  Musculoskeletal:        General: Normal range of motion.     Cervical back: Normal range of motion and neck supple.  Skin:    General: Skin is warm and dry.  Neurological:     Mental Status: She is alert and oriented to person, place, and time.  Psychiatric:        Mood and Affect: Mood normal.        Thought Content: Thought content normal.      UC Treatments / Results  Labs (all labs ordered are listed, but only abnormal results are displayed) Labs Reviewed  COVID-19, FLU A+B NAA    EKG   Radiology No results found.  Procedures Procedures (including critical care time)  Medications Ordered in UC Medications - No data to display  Initial Impression / Assessment and Plan / UC Course  I have reviewed the triage vital signs and the nursing notes.  Pertinent labs & imaging results that were available during my care of the patient were reviewed by me and considered in my medical decision making (see chart for details).     Patient is very well-appearing, alert and in no acute distress.  Her exam is overall reassuring and suggestive of a viral upper respiratory infection.  Vital signs are reassuring  today apart from significant bradycardia at 48 bpm, rechecked several times.  Her EKG today shows sinus bradycardia at 46 bpm with no acute ST or T wave changes.  Her orthostatic vital signs were stable with no significant change positionally.  She denies any associated symptoms with this or new medications changed recently.  Discussed to push fluids, continue to monitor and go to emergency department if symptoms develop at any time regard to this.  Follow-up with primary care in the next week to recheck this.  Regarding her viral symptoms, COVID and flu testing pending, discussed continued Coricidin HBP, Flonase nasal spray, supportive care.  Return for worsening symptoms.  Final Clinical Impressions(s) / UC Diagnoses   Final diagnoses:  Viral URI  Bradycardia     Discharge Instructions      Continue the Coricidin HBP, take Flonase twice daily to help with any congestion or postnasal drip, continue supportive measures such as Vicks VapoRub, fluids, rest.  I do recommend that you follow-up with your primary care provider in the next week or so regarding your heart rate to make sure that has normalized.  Go to emergency department immediately if you begin to feel lightheaded or dizzy, have difficulty breathing, chest pain or any other new symptoms.    ED Prescriptions     Medication Sig Dispense Auth. Provider  fluticasone (FLONASE) 50 MCG/ACT nasal spray Place 1 spray into both nostrils 2 (two) times daily. 16 g Volney American, [place]      PDMP not reviewed this encounter.   Volney American, [place] 05/07/22 1233

## 2022-05-07 NOTE — ED Triage Notes (Signed)
Pt reports cough and fever since lats night. Pt reports she feels the same way she did when she had COVID 2 years ago.

## 2022-05-07 NOTE — Discharge Instructions (Signed)
Continue the Coricidin HBP, take Flonase twice daily to help with any congestion or postnasal drip, continue supportive measures such as Vicks VapoRub, fluids, rest.  I do recommend that you follow-up with your primary care provider in the next week or so regarding your heart rate to make sure that has normalized.  Go to emergency department immediately if you begin to feel lightheaded or dizzy, have difficulty breathing, chest pain or any other new symptoms.

## 2022-05-08 LAB — COVID-19, FLU A+B NAA
Influenza A, NAA: NOT DETECTED
Influenza B, NAA: NOT DETECTED
SARS-CoV-2, NAA: NOT DETECTED

## 2022-06-05 DIAGNOSIS — R001 Bradycardia, unspecified: Secondary | ICD-10-CM | POA: Insufficient documentation

## 2022-06-05 NOTE — Progress Notes (Unsigned)
Patient referred by Iona Beard, MD for pre-op risk stratification  Subjective:   Monique Wagner, female    DOB: 03-Sep-1941, 81 y.o.   MRN: [id number]   No chief complaint on file.    HPI  81 y.o. African American female with hypertension, hyperlipidemia  ***Patient is going to undergo right knee arthroplasty on 04/26/2021.  She underwent left knee arthroplasty a year ago without any cardiac events.  Patient does not have any baseline general dyspnea symptoms.  He is able to get around in her house but fair functional capacity without any symptoms.  Reviewed recent lab results with the patient, details below.  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   Current Outpatient Medications:    acetaminophen (TYLENOL) 325 MG tablet, Take 650 mg by mouth every 6 (six) hours as needed for moderate pain., Disp: , Rfl:    atorvastatin (LIPITOR) 40 MG tablet, TAKE 1 TABLET BY MOUTH EVERY DAY, Disp: 90 tablet, Rfl: 1   Carboxymethylcell-Hypromellose (GENTEAL) 0.25-0.3 % GEL, Place 1 drop into both eyes as needed (dry eyes)., Disp: , Rfl:    cholecalciferol (VITAMIN D3) 25 MCG (1000 UNIT) tablet, Take 1,000 Units by mouth daily., Disp: , Rfl:    CRANBERRY PO, Take 1 tablet by mouth daily., Disp: , Rfl:    DANDELION PO, Take 1 capsule by mouth daily., Disp: , Rfl:    dorzolamide-timolol (COSOPT) 22.3-6.8 MG/ML ophthalmic solution, Place 1 drop into both eyes 2 (two) times daily., Disp: , Rfl:    fluticasone (FLONASE) 50 MCG/ACT nasal spray, Place 1 spray into both nostrils 2 (two) times daily., Disp: 16 g, Rfl: 2   Milk Thistle 150 MG CAPS, Take 150 mg by mouth daily., Disp: , Rfl:    Multiple Vitamins-Minerals (MULTIVITAMIN PO), Take 1 tablet by mouth daily. , Disp: , Rfl:    Netarsudil-Latanoprost (ROCKLATAN) 0.02-0.005 % SOLN, Place 1 drop into both eyes every evening., Disp: , Rfl:    Omega-3 Fatty Acids (FISH  OIL) 1200 MG CAPS, Take 1,200 mg by mouth daily., Disp: , Rfl:    ondansetron (ZOFRAN) 4 MG tablet, Take 1 tablet (4 mg total) by mouth every 8 (eight) hours as needed for nausea or vomiting., Disp: 30 tablet, Rfl: 1   telmisartan-hydrochlorothiazide (MICARDIS HCT) 40-12.5 MG tablet, Take 1 tablet by mouth daily. , Disp: , Rfl:    traMADol (ULTRAM) 50 MG tablet, Take 1 tablet (50 mg total) by mouth every 6 (six) hours as needed for severe pain or moderate pain., Disp: 30 tablet, Rfl: 0 No current facility-administered medications for this visit.  Facility-Administered Medications Ordered in Other Visits:    IRRISEPT - 0.05% chlorhexedine in sterile water for irrigation, , , PRN, Rod Can, MD, 450 mL at 04/26/21 1514  Cardiovascular and other pertinent studies:  EKG 04/21/2021: Sinus rhythm 84 bpm  Leftward axis Low voltage Nonspecific T-abnormality   Recent labs: 04/11/2021: Glucose 85, BUN/Cr 15/1.22. EGFR 45. Na/K 140/4.2. Calcium 10.5. Rest of the CMP normal H/H 13/40. MCV 92. Platelets 252 Chol 245, TG 190, HDL ?, LDL 153    Review of Systems  Cardiovascular:  Negative for chest pain, dyspnea on exertion, leg swelling, palpitations and syncope.  Musculoskeletal:  Positive for joint pain.         There were no vitals filed for this visit.    There is no height or weight on file to  calculate BMI. There were no vitals filed for this visit.    Objective:   Physical Exam Vitals and nursing note reviewed.  Constitutional:      General: She is not in acute distress. Neck:     Vascular: No JVD.  Cardiovascular:     Rate and Rhythm: Normal rate and regular rhythm.     Heart sounds: Normal heart sounds. No murmur heard. Pulmonary:     Effort: Pulmonary effort is normal.     Breath sounds: Normal breath sounds. No wheezing or rales.  Musculoskeletal:     Right lower leg: No edema.     Left lower leg: No edema.         Assessment & Recommendations:     81 y.o. African American female with hypertension, hyperlipidemia  Bradycardia: ***  ***Mixed hyperlipidemia: Given elevated LDL, switch to fenofibrate and ezetimibe to rosuvastatin 40 mg daily.  ***   Nigel Mormon, MD Pager: [phone] Office: [phone]

## 2022-06-06 ENCOUNTER — Encounter: Payer: Self-pay | Admitting: Cardiology

## 2022-06-06 ENCOUNTER — Ambulatory Visit: Payer: Medicare Other | Admitting: Cardiology

## 2022-06-06 VITALS — BP 163/70 | HR 50 | Temp 98.7°F | Resp 14 | Ht 63.0 in | Wt 165.8 lb

## 2022-06-06 DIAGNOSIS — I10 Essential (primary) hypertension: Secondary | ICD-10-CM

## 2022-06-06 DIAGNOSIS — R0609 Other forms of dyspnea: Secondary | ICD-10-CM | POA: Insufficient documentation

## 2022-06-06 DIAGNOSIS — R001 Bradycardia, unspecified: Secondary | ICD-10-CM

## 2022-06-06 MED ORDER — AMLODIPINE BESYLATE 5 MG PO TABS: 5.0000 mg | ORAL_TABLET | Freq: Every day | ORAL | 3 refills | Status: DC

## 2022-06-07 LAB — TSH: TSH: 1.02 u[IU]/mL (ref 0.450–4.500)

## 2022-06-07 NOTE — Progress Notes (Signed)
Called patient, Monique Wagner, LMAM

## 2022-06-08 NOTE — Progress Notes (Signed)
Called pt no answer, left a vm

## 2022-06-11 NOTE — Progress Notes (Signed)
Lvm for patient to return call to discuss above information

## 2022-06-11 NOTE — Progress Notes (Signed)
Patient returned call and was advised of the results from the Thyroid labwork.

## 2022-06-13 ENCOUNTER — Ambulatory Visit: Payer: Medicare Other

## 2022-06-13 ENCOUNTER — Inpatient Hospital Stay: Payer: Self-pay

## 2022-06-13 ENCOUNTER — Telehealth: Payer: Self-pay

## 2022-06-13 DIAGNOSIS — I10 Essential (primary) hypertension: Secondary | ICD-10-CM

## 2022-06-13 DIAGNOSIS — R001 Bradycardia, unspecified: Secondary | ICD-10-CM

## 2022-06-13 NOTE — Telephone Encounter (Signed)
Patient new start to RPM program. Called patient for 1 week follow up.  BP Summary: Average Systolic BP Level 124.56 mmHg Lowest Systolic BP Level 112 mmHg Highest Systolic BP Level 140 mmHg  BP Readings: 06/13/2022 Wednesday at 08:50 AM 120 / 62      06/12/2022 Tuesday at 12:56 PM 112 / 64      06/11/2022 Monday at 08:38 AM 123 / 63      06/10/2022 Sunday at 06:40 PM 113 / 54      06/10/2022 Sunday at 02:11 PM 123 / 63      06/10/2022 Sunday at 09:25 AM 113 / 61      06/09/2022 Saturday at 07:22 PM 123 / 64      06/09/2022 Saturday at 03:56 PM 122 / 59      06/09/2022 Saturday at 09:41 AM 115 / 59      06/09/2022 Saturday at 07:39 AM 140 / 72      06/08/2022 Friday at 03:37 PM 122 / 64      06/08/2022 Friday at 10:58 AM 128 / 73      06/08/2022 Friday at 08:55 AM 140 / 86      06/07/2022 Thursday at 09:48 AM 129 / 62      06/07/2022 Thursday at 09:06 AM 130 / 86      09 /03/2022 Wednesday at 09:35 AM 140 / 75

## 2022-06-13 NOTE — Telephone Encounter (Signed)
Look good.  Thanks MJP

## 2022-06-14 ENCOUNTER — Telehealth: Payer: Self-pay

## 2022-06-14 NOTE — Telephone Encounter (Signed)
Called and spoke to patient she stated when she woke up today she noticed red around the monitor patient took off the monitor and her skin was raw and developed a blister patient took a Benadryl. Advised patient to return her monitor patient only had it on since yesterday do you want patient to come in for repeat monitor please advise

## 2022-06-14 NOTE — Telephone Encounter (Signed)
We can hold off repeat monitor for now. Encourage patient to use topical aloe vera.  Thanks MJP

## 2022-06-14 NOTE — Telephone Encounter (Signed)
Pt called regarding blistering from monitor applied yesterday. Advised to take it off and someone will call her with further instructions

## 2022-06-15 ENCOUNTER — Telehealth: Payer: Self-pay

## 2022-06-15 NOTE — Telephone Encounter (Signed)
Called and spoke with patient, she will try the aloe vera and will be here on 07/06/2022 for her scheduled appointment.

## 2022-06-15 NOTE — Telephone Encounter (Signed)
Spoke with patient and informed her about message , regarding monitor>

## 2022-06-15 NOTE — Telephone Encounter (Signed)
Tried to call patient to inform about information above but patient did not answer or have a VM will try again.

## 2022-07-02 ENCOUNTER — Other Ambulatory Visit (HOSPITAL_COMMUNITY): Payer: Self-pay | Admitting: Family Medicine

## 2022-07-02 DIAGNOSIS — Z1231 Encounter for screening mammogram for malignant neoplasm of breast: Secondary | ICD-10-CM

## 2022-07-06 ENCOUNTER — Ambulatory Visit: Payer: Medicare Other | Admitting: Cardiology

## 2022-07-06 ENCOUNTER — Encounter: Payer: Self-pay | Admitting: Cardiology

## 2022-07-06 VITALS — BP 144/70 | HR 49 | Temp 98.7°F | Resp 16 | Ht 63.0 in | Wt 165.6 lb

## 2022-07-06 DIAGNOSIS — R001 Bradycardia, unspecified: Secondary | ICD-10-CM

## 2022-07-06 DIAGNOSIS — R0609 Other forms of dyspnea: Secondary | ICD-10-CM

## 2022-07-06 DIAGNOSIS — I10 Essential (primary) hypertension: Secondary | ICD-10-CM

## 2022-07-06 NOTE — Progress Notes (Signed)
Patient referred by Iona Beard, MD for pre-op risk stratification  Subjective:   Monique Wagner, female    DOB: [birth year], 81 y.o.   MRN: [id number]   Chief Complaint  Patient presents with   Results   Follow-up     HPI  81 y.o. African American female with hypertension, hyperlipidemia, bradycardia, exertional dyspnea  Patient continues to have exertional dyspnea.  At the same time, she mentions that her breathing has been like this for last "50 years".  She denies any chest pain. Reviewed recent test results with the patient, details below.    Current Outpatient Medications:    acetaminophen (TYLENOL) 325 MG tablet, Take 650 mg by mouth every 6 (six) hours as needed for moderate pain., Disp: , Rfl:    amLODipine (NORVASC) 5 MG tablet, Take 1 tablet (5 mg total) by mouth daily., Disp: 30 tablet, Rfl: 3   atorvastatin (LIPITOR) 40 MG tablet, TAKE 1 TABLET BY MOUTH EVERY DAY, Disp: 90 tablet, Rfl: 1   cholecalciferol (VITAMIN D3) 25 MCG (1000 UNIT) tablet, Take 1,000 Units by mouth daily., Disp: , Rfl:    CRANBERRY PO, Take 1 tablet by mouth daily., Disp: , Rfl:    DANDELION PO, Take 1 capsule by mouth daily., Disp: , Rfl:    dorzolamide-timolol (COSOPT) 22.3-6.8 MG/ML ophthalmic solution, Place 1 drop into both eyes 2 (two) times daily., Disp: , Rfl:    Milk Thistle 150 MG CAPS, Take 150 mg by mouth daily., Disp: , Rfl:    Multiple Vitamins-Minerals (MULTIVITAMIN PO), Take 1 tablet by mouth daily. , Disp: , Rfl:    Netarsudil-Latanoprost (ROCKLATAN) 0.02-0.005 % SOLN, Place 1 drop into both eyes every evening., Disp: , Rfl:    Omega-3 Fatty Acids (FISH OIL) 1200 MG CAPS, Take 1,200 mg by mouth daily., Disp: , Rfl:    oxybutynin (DITROPAN) 5 MG tablet, Take 1 tablet by mouth daily., Disp: , Rfl:    telmisartan-hydrochlorothiazide (MICARDIS HCT) 40-12.5 MG tablet, Take 1 tablet by mouth daily. , Disp: , Rfl:  No current facility-administered medications for this  visit.  Facility-Administered Medications Ordered in Other Visits:    IRRISEPT - 0.05% chlorhexedine in sterile water for irrigation, , , PRN, Rod Can, MD, 450 mL at 04/26/21 1514  Cardiovascular and other pertinent studies:  Echocardiogram 06/13/2022:  1. Normal LV systolic function with visual EF 60-65%.  Left ventricle cavity is normal in size. Normal left  ventricular wall thickness. Normal global wall motion.  Doppler evidence of grade I (impaired) diastolic  dysfunction, normal LAP. Calculated EF 67%.  2. Trileaflet aortic valve with no regurgitation. Sclerosis of  the aortic valve.  3. Structurally normal mitral valve. Mild (Grade I) mitral  regurgitation.  4. Structurally normal tricuspid valve. Mild tricuspid  regurgitation. No evidence of pulmonary hypertension.  5. no significant change compared to prior   Mobile cardiac telemetry 22 hours 06/13/2022 - 06/14/2022: Dominant rhythm: Sinus. HR 37-80 bpm. Avg HR 52 bpm, in sinus rhythm. 1 episode of atrial tachycardia at 133 bpm for 6 beats. <1% isolated SVE, couplet/triplets. 0 episodes of VT. <1% isolated VE, no couplet/triplets. No atrial fibrillation/atrial flutter/VT/high grade AV block, sinus pause >3sec noted. 0 patient triggered events.     EKG 06/06/2022: Sinus bradycardia 49 bpm  Low voltage in precordial leads   Recent labs: 06/2022: TSH 1.0 normal  03/2022: Chol 161, TG 166, HDL 54, LDL 81  01/13/2022: Glucose 132, BUN/Cr 15/0.96. EGFR 59. Na/K 140/3.3.  H/H  11/35. MCV 93. Platelets 183   Review of Systems  Cardiovascular:  Positive for dyspnea on exertion. Negative for chest pain, leg swelling, palpitations and syncope.  Musculoskeletal:  Positive for joint pain.         Vitals:   07/06/22 0833  BP: (!) 144/70  Pulse: (!) 49  Resp: 16  Temp: 98.7 F (37.1 C)  SpO2: 95%     Body mass index is 29.33 kg/m. Filed Weights   07/06/22 0833  Weight: 165 lb 9.6 oz (75.1 kg)      Objective:   Physical Exam Vitals and nursing note reviewed.  Constitutional:      General: She is not in acute distress. Neck:     Vascular: No JVD.  Cardiovascular:     Rate and Rhythm: Regular rhythm. Bradycardia present.     Heart sounds: Normal heart sounds. No murmur heard. Pulmonary:     Effort: Pulmonary effort is normal.     Breath sounds: Normal breath sounds. No wheezing or rales.  Musculoskeletal:     Right lower leg: No edema.     Left lower leg: No edema.         Assessment & Recommendations:    81 y.o. African American female with hypertension, hyperlipidemia, bradycardia, exertional dyspnea  Bradycardia, exertional dyspnea: No significant structural abnormalities on echocardiogram. Sinus bradycardia around 50 bpm.  No sinus node dysfunction or AV conduction abnormality seen on resting EKG.  She is not on any AV nodal blocking agents. TSH normal. Heart rate only increased to up to 80 bpm on cardiac telemetry.  I do suspect chronotropic incompetence, with less likely second differential being angina equivalent. Echo and exercise nuclear stress test.  We will start with treadmill, and and Lexiscan, should she not reach target heart rate.  Hypertension: Reviewed remote patient monitoring data.  Blood pressures well controlled.  Mixed hyperlipidemia: Well-controlled.  Further recommendations after above testing.   Nigel Mormon, MD Pager: [phone] Office: [phone]

## 2022-07-09 ENCOUNTER — Ambulatory Visit: Payer: Medicare Other

## 2022-07-09 DIAGNOSIS — R001 Bradycardia, unspecified: Secondary | ICD-10-CM

## 2022-07-09 DIAGNOSIS — R0609 Other forms of dyspnea: Secondary | ICD-10-CM

## 2022-07-11 NOTE — Progress Notes (Signed)
My mistake. Patient did not even want to try walking lexiscan. Tech has documented on the tech side. I missed it

## 2022-07-11 NOTE — Progress Notes (Signed)
Thank you for checking. I had had discussion with the patient about walking the treadmill for specific reason, but I understand if she does not want to walk.  Thanks MJP

## 2022-07-11 NOTE — Progress Notes (Signed)
Thank you for the follow up. This is very helpful.  If she is okay to walk, we should do an exercise treadmill stress test.  Thanks MJP

## 2022-07-12 ENCOUNTER — Encounter: Payer: Self-pay | Admitting: Emergency Medicine

## 2022-07-12 ENCOUNTER — Ambulatory Visit (INDEPENDENT_AMBULATORY_CARE_PROVIDER_SITE_OTHER): Payer: Medicare Other

## 2022-07-12 ENCOUNTER — Other Ambulatory Visit: Payer: Self-pay

## 2022-07-12 ENCOUNTER — Ambulatory Visit
Admission: EM | Admit: 2022-07-12 | Discharge: 2022-07-12 | Disposition: A | Discharge status: Home / Self Care | Payer: Medicare Other | Attending: Nurse Practitioner | Admitting: Nurse Practitioner

## 2022-07-12 DIAGNOSIS — M79675 Pain in left toe(s): Secondary | ICD-10-CM | POA: Diagnosis not present

## 2022-07-12 MED ORDER — PREDNISONE 20 MG PO TABS: 40.0000 mg | ORAL_TABLET | Freq: Every day | ORAL | 0 refills | Status: AC

## 2022-07-12 NOTE — ED Provider Notes (Signed)
[facility]    CSN: [id number] Arrival date & time: 07/12/22  1131      History   Chief Complaint Chief Complaint  Patient presents with   Toe Pain    HPI Monique Wagner is a 81 y.o. female.   The history is provided by the patient.   Patient presents for complaints of pain in the left great toe that been present for the past month.  Patient states symptoms have been, coming and going" but over the past week, symptoms have worsened.  She denies any known injury or trauma to the left great toe.  She states walking and moving the toe makes her pain worse at this time.  She denies numbness, tingling, swelling, or radiation of pain.  Patient states that she takes Tylenol daily for arthritis.  Patient does use a cane to ambulate at baseline.  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/26/2021   Pre-op evaluation 04/21/2021   Yeast vaginitis 03/23/2021   Vaginal discharge 03/23/2021   Osteoarthritis of left knee 05/18/2020   Anemia    Lower abdominal pain    Diverticulitis 08/28/2017   Pain in the chest    Chest pain 09/03/2014   Essential hypertension 09/03/2014   Mixed hyperlipidemia 09/03/2014   Pain in joint, shoulder region 07/10/2012   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   Osteoarthrosis, unspecified whether generalized or localized, lower leg 04/21/2008   KNEE PAIN 04/21/2008   HIGH BLOOD PRESSURE 04/20/2008    Past Surgical History:  Procedure Laterality Date   ABDOMINAL HYSTERECTOMY     BREAST SURGERY     KNEE ARTHROPLASTY Left  05/18/2020   Procedure: COMPUTER ASSISTED TOTAL KNEE ARTHROPLASTY;  Surgeon: Rod Can, MD;  Location: [facility];  Service: Orthopedics;  Laterality: Left;   KNEE ARTHROPLASTY Right 04/26/2021   Procedure: COMPUTER ASSISTED TOTAL KNEE ARTHROPLASTY;  Surgeon: Rod Can, MD;  Location: [facility];  Service: Orthopedics;  Laterality: Right;   REVERSE SHOULDER ARTHROPLASTY Left 01/12/2022   Procedure: REVERSE SHOULDER ARTHROPLASTY;  Surgeon: Nicholes Stairs, MD;  Location: [facility];  Service: Orthopedics;  Laterality: Left;  150    OB History     Gravida  1   Para  1   Term  1   Preterm      AB      Living  1      SAB      IAB      Ectopic      Multiple      Live Births  1            Home Medications    Prior to Admission medications   Medication Sig Start Date End Date Taking? Authorizing Provider  predniSONE (DELTASONE) 20 MG tablet Take 2 tablets (40 mg total) by mouth daily with breakfast for 5 days. 07/12/22 07/17/22 Yes Alvia Tory-Warren, Alda Lea, NP  acetaminophen (TYLENOL) 325 MG tablet Take 650 mg by mouth every 6 (six) hours as  needed for moderate pain.    [provider]  amLODipine (NORVASC) 5 MG tablet Take 1 tablet (5 mg total) by mouth daily. 06/06/22 05/26/24  Patwardhan, Anabel Bene, MD  atorvastatin (LIPITOR) 40 MG tablet TAKE 1 TABLET BY MOUTH EVERY DAY 05/01/21   Patwardhan, Manish J, MD  cholecalciferol (VITAMIN D3) 25 MCG (1000 UNIT) tablet Take 1,000 Units by mouth daily.    [provider]  CRANBERRY PO Take 1 tablet by mouth daily.    [provider]  DANDELION PO Take 1 capsule by mouth daily.    [provider]  dorzolamide-timolol (COSOPT) 22.3-6.8 MG/ML ophthalmic solution Place 1 drop into both eyes 2 (two) times daily.    [provider]  Milk Thistle 150 MG CAPS Take 150 mg by mouth daily.    [provider]  Multiple Vitamins-Minerals (MULTIVITAMIN PO) Take 1 tablet by mouth daily.      [provider]  Netarsudil-Latanoprost (ROCKLATAN) 0.02-0.005 % SOLN Place 1 drop into both eyes every evening.    [provider]  Omega-3 Fatty Acids (FISH OIL) 1200 MG CAPS Take 1,200 mg by mouth daily.    [provider]  oxybutynin (DITROPAN) 5 MG tablet Take 1 tablet by mouth daily. 04/07/22   [provider]  telmisartan-hydrochlorothiazide (MICARDIS HCT) 40-12.5 MG tablet Take 1 tablet by mouth daily.     [provider]    Family History Family History  Problem Relation Age of Onset   Liver cancer Mother    Congestive Heart Failure Father    Hypertension Sister    Hyperlipidemia Sister    Diabetes Sister    Diabetes Brother    Arthritis Other    Cancer Other    Diabetes Other    Heart disease Other     Social History Social History   Tobacco Use   Smoking status: Never   Smokeless tobacco: Never  Vaping Use   Vaping Use: Never used  Substance Use Topics   Alcohol use: No   Drug use: No     Allergies   Latex and Tape   Review of Systems Review of Systems Per HPI  Physical Exam Triage Vital Signs ED Triage Vitals [07/12/22 1200]  Enc Vitals Group     BP (!) 153/74     Pulse Rate (!) 52     Resp 20     Temp 98.8 F (37.1 C)     Temp Source Oral     SpO2 92 %     Weight      Height      Head Circumference      Peak Flow      Pain Score 0     Pain Loc      Pain Edu?      Excl. in GC?    No data found.  Updated Vital Signs BP (!) 153/74 (BP Location: Right Arm)   Pulse (!) 52   Temp 98.8 F (37.1 C) (Oral)   Resp 20   SpO2 92%   Visual Acuity Right Eye Distance:   Left Eye Distance:   Bilateral Distance:    Right Eye Near:   Left Eye Near:    Bilateral Near:     Physical Exam Vitals and nursing note reviewed.  Constitutional:      General: She is not in acute distress.    Appearance: Normal appearance.  Eyes:     Extraocular Movements: Extraocular movements intact.  Conjunctiva/sclera: Conjunctivae normal.     Pupils: Pupils are equal, round, and reactive to light.  Pulmonary:     Effort: Pulmonary effort is normal.     Breath sounds: Normal breath sounds.  Musculoskeletal:     Cervical back: Normal range of motion.     Left foot: Normal capillary refill. Tenderness (lateral aspect of the proximal phalanx of the left great toe) present. No swelling or deformity. Normal pulse.  Lymphadenopathy:     Cervical: No cervical adenopathy.  Skin:    General: Skin is warm and dry.  Neurological:     General: No focal deficit present.     Mental Status: She is alert and oriented to person, place, and time.  Psychiatric:        Mood and Affect: Mood normal.        Behavior: Behavior normal.        Thought Content: Thought content normal.        Judgment: Judgment normal.      UC Treatments / Results  Labs (all labs ordered are listed, but only abnormal results are displayed) Labs Reviewed  URIC ACID    EKG   Radiology DG Toe Great Left  Result Date: 07/12/2022 CLINICAL DATA:  Pain x1 month EXAM: LEFT GREAT TOE COMPARISON:  None Available. FINDINGS: No fracture or dislocation is seen. No focal erosive changes are noted. There are faint calcific densities adjacent to the head of the first metatarsal. IMPRESSION: No fracture or dislocation is seen in left big toe. There is faint increased density in the soft tissues adjacent to the head of the first metatarsal. Please correlate for possible gout. Electronically Signed   By: Ernie Avena M.D.   On: 07/12/2022 12:29    Procedures Procedures (including critical care time)  Medications Ordered in UC Medications - No data to display  Initial Impression / Assessment and Plan / UC Course  I have reviewed the triage vital signs and the nursing notes.  Pertinent labs & imaging results that were available during my care of the patient were reviewed by me and considered in my medical decision  making (see chart for details).  The patient is a pleasant, well-appearing female who is currently in no acute distress.  Although she is mildly hypertensive and bradycardic, she is in no acute distress.  Patient presents for pain of the left great toe that has been present for 1 month, but worsened over the past week. There is no obvious injury or trauma present.  X-ray is negative for fracture or dislocation, but questionable for gout.  Uric acid level is pending to see if elevated which will correlate with gout.  Patient will be prescribed prednisone 40 mg for her current symptoms..  Patient was encouraged to keep the left foot elevated, and to apply ice if swelling or pain worsen.  Patient was advised that she will be contacted if the results of the test are abnormal.  Patient was provided the opportunity to ask questions.  All questions were answered.  Patient is stable for discharge to home. Final Clinical Impressions(s) / UC Diagnoses   Final diagnoses:  Great toe pain, left     Discharge Instructions      The x-rays are negative for fracture or dislocation.  We have drawn a uric acid level to see if your symptoms are being caused by gout.  If the results of the pending blood work is abnormal, you will be contacted. Take medication as prescribed.  Avoid foods that are high in purine.  Resources has been provided to provide some tips with eating. May apply ice to the left great toe to help with pain or swelling as needed.  Apply for 20 minutes, remove for 1 hour, then repeat is much as possible or as needed. If your symptoms worsen or fail to improve, please follow-up with your primary care physician for reevaluation.     ED Prescriptions     Medication Sig Dispense Auth. Provider   predniSONE (DELTASONE) 20 MG tablet Take 2 tablets (40 mg total) by mouth daily with breakfast for 5 days. 10 tablet Chamar Broughton-Warren, Sadie Haber, NP      PDMP not reviewed this encounter.   Abran Cantor, NP 07/12/22 1244

## 2022-07-12 NOTE — ED Triage Notes (Signed)
Pt reports left great toe pain that has increased last several days. Denies any known injury.

## 2022-07-12 NOTE — Discharge Instructions (Addendum)
The x-rays are negative for fracture or dislocation.  We have drawn a uric acid level to see if your symptoms are being caused by gout.  If the results of the pending blood work is abnormal, you will be contacted. Take medication as prescribed. Avoid foods that are high in purine.  Resources has been provided to provide some tips with eating. May apply ice to the left great toe to help with pain or swelling as needed.  Apply for 20 minutes, remove for 1 hour, then repeat is much as possible or as needed. If your symptoms worsen or fail to improve, please follow-up with your primary care physician for reevaluation.

## 2022-07-13 LAB — URIC ACID: Uric Acid: 6.5 mg/dL (ref 3.1–7.9)

## 2022-07-25 ENCOUNTER — Telehealth: Payer: Self-pay

## 2022-07-25 NOTE — Telephone Encounter (Signed)
Spoke with patient today and she would like to know how long she has to stay in [employer] program?  I encouraged her to continue checking daily for now. Below are her current readings and she has been compliant with daily readings.  BP Summary: Average Systolic BP Level 697.94 mmHg Lowest Systolic BP Level 801 mmHg Highest Systolic BP Level 655 mmHg  BP Readings: 07/25/2022 Wednesday at 09:02 AM 122 / 72      07/24/2022 Tuesday at 05:28 PM 110 / 60      07/23/2022 Monday at 06:41 PM 120 / 65      07/23/2022 Monday at 11:59 AM 142 / 76      07/22/2022 Sunday at 05:44 PM 124 / 66      07/22/2022 Sunday at 08:58 AM 127 / 65      07/21/2022 Saturday at 05:33 PM 113 / 59      07/21/2022 Saturday at 01:05 PM 136 / 75      07/20/2022 Friday at 10:51 AM 120 / 56      07/19/2022 Thursday at 07:21 PM 109 / 67      07/19/2022 Thursday at 08:17 AM 123 / 57      07/18/2022 Wednesday at 06:08 PM 112 / 58      10 /18/2023 Wednesday at 11:50 AM 118 / 58

## 2022-07-25 NOTE — Telephone Encounter (Signed)
Can graduate.  Thanks MJP

## 2022-08-02 ENCOUNTER — Other Ambulatory Visit: Payer: Self-pay | Admitting: Family Medicine

## 2022-08-02 NOTE — Telephone Encounter (Signed)
Requested Prescriptions  Pending Prescriptions Disp Refills   fluticasone (FLONASE) 50 MCG/ACT nasal spray [Pharmacy Med Name: FLUTICASONE PROP 50 MCG SPRAY] 48 mL     Sig: PLACE 1 SPRAY INTO BOTH NOSTRILS 2 (TWO) TIMES DAILY     There is no refill protocol information for this order      

## 2022-08-09 ENCOUNTER — Other Ambulatory Visit: Payer: Self-pay

## 2022-08-09 ENCOUNTER — Ambulatory Visit
Admission: EM | Admit: 2022-08-09 | Discharge: 2022-08-09 | Disposition: A | Discharge status: Home / Self Care | Payer: Medicare Other | Attending: Nurse Practitioner | Admitting: Nurse Practitioner

## 2022-08-09 ENCOUNTER — Ambulatory Visit (HOSPITAL_COMMUNITY)
Admission: RE | Admit: 2022-08-09 | Discharge: 2022-08-09 | Disposition: A | Discharge status: Home / Self Care | Payer: Medicare Other | Source: Ambulatory Visit | Attending: Family Medicine | Admitting: Family Medicine

## 2022-08-09 ENCOUNTER — Encounter: Payer: Self-pay | Admitting: Emergency Medicine

## 2022-08-09 DIAGNOSIS — Z1231 Encounter for screening mammogram for malignant neoplasm of breast: Secondary | ICD-10-CM | POA: Insufficient documentation

## 2022-08-09 DIAGNOSIS — R0982 Postnasal drip: Secondary | ICD-10-CM | POA: Diagnosis present

## 2022-08-09 DIAGNOSIS — J029 Acute pharyngitis, unspecified: Secondary | ICD-10-CM | POA: Diagnosis present

## 2022-08-09 DIAGNOSIS — Z1152 Encounter for screening for COVID-19: Secondary | ICD-10-CM | POA: Diagnosis present

## 2022-08-09 DIAGNOSIS — R051 Acute cough: Secondary | ICD-10-CM | POA: Insufficient documentation

## 2022-08-09 MED ORDER — FLUTICASONE PROPIONATE 50 MCG/ACT NA SUSP: 1.0000 | Freq: Every day | NASAL | 0 refills | Status: DC

## 2022-08-09 MED ORDER — BENZONATATE 100 MG PO CAPS: 100.0000 mg | ORAL_CAPSULE | Freq: Three times a day (TID) | ORAL | 0 refills | Status: DC | PRN

## 2022-08-09 NOTE — ED Provider Notes (Signed)
[facility]    CSN: [id number] Arrival date & time: 08/09/22  0913      History   Chief Complaint Chief Complaint  Patient presents with   Sore Throat    HPI Monique Wagner is a 81 y.o. female.   Patient presents for 2-day history of sneezing, tickly throat at nighttime, slight headache, and dry hacking cough that is worse at nighttime.  She denies fever, body aches, chills, shortness of breath or chest pain, chest congestion, nasal congestion, runny nose, abdominal pain, nausea/vomiting, diarrhea, decreased appetite, loss of taste or smell, and new fatigue.  She has taken Coricidin a couple of times does not know if this is helped.  Denies history of allergies in the past.  Reports she does have a history of glaucoma.      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/26/2021   Pre-op evaluation 04/21/2021   Yeast vaginitis 03/23/2021   Vaginal discharge 03/23/2021   Osteoarthritis of left knee 05/18/2020   Anemia    Lower abdominal pain    Diverticulitis 08/28/2017   Pain in the chest    Chest pain 09/03/2014   Essential hypertension 09/03/2014   Mixed hyperlipidemia 09/03/2014   Pain in joint, shoulder region 07/10/2012   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   Osteoarthrosis, unspecified whether generalized or localized, lower leg 04/21/2008   KNEE PAIN 04/21/2008   HIGH BLOOD PRESSURE 04/20/2008    Past Surgical History:  Procedure Laterality Date   ABDOMINAL HYSTERECTOMY     BREAST SURGERY     KNEE ARTHROPLASTY Left 05/18/2020    Procedure: COMPUTER ASSISTED TOTAL KNEE ARTHROPLASTY;  Surgeon: Samson Frederic, MD;  Location: [facility];  Service: Orthopedics;  Laterality: Left;   KNEE ARTHROPLASTY Right 04/26/2021   Procedure: COMPUTER ASSISTED TOTAL KNEE ARTHROPLASTY;  Surgeon: Samson Frederic, MD;  Location: [facility];  Service: Orthopedics;  Laterality: Right;   REVERSE SHOULDER ARTHROPLASTY Left 01/12/2022   Procedure: REVERSE SHOULDER ARTHROPLASTY;  Surgeon: Yolonda Kida, MD;  Location: [facility];  Service: Orthopedics;  Laterality: Left;  150    OB History     Gravida  1   Para  1   Term  1   Preterm      AB      Living  1      SAB      IAB      Ectopic      Multiple      Live Births  1            Home Medications    Prior to Admission medications   Medication Sig Start Date End Date Taking? Authorizing Provider  benzonatate (TESSALON) 100 MG capsule Take 1 capsule (100 mg total) by mouth 3 (three) times daily as needed for cough. Do not take with alcohol or while driving or operating heavy machinery.  May cause drowsiness. 08/09/22  Yes Valentino Nose, NP  fluticasone (FLONASE) 50 MCG/ACT nasal spray Place  1 spray into both nostrils daily. 08/09/22  Yes Valentino Nose, NP  acetaminophen (TYLENOL) 325 MG tablet Take 650 mg by mouth every 6 (six) hours as needed for moderate pain.    [provider]  amLODipine (NORVASC) 5 MG tablet Take 1 tablet (5 mg total) by mouth daily. 06/06/22 05/26/24  Patwardhan, Anabel Bene, MD  atorvastatin (LIPITOR) 40 MG tablet TAKE 1 TABLET BY MOUTH EVERY DAY 05/01/21   Patwardhan, Manish J, MD  cholecalciferol (VITAMIN D3) 25 MCG (1000 UNIT) tablet Take 1,000 Units by mouth daily.    [provider]  CRANBERRY PO Take 1 tablet by mouth daily.    [provider]  DANDELION PO Take 1 capsule by mouth daily.    [provider]  dorzolamide-timolol (COSOPT) 22.3-6.8 MG/ML ophthalmic solution Place 1 drop into both eyes 2 (two)  times daily.    [provider]  Milk Thistle 150 MG CAPS Take 150 mg by mouth daily.    [provider]  Multiple Vitamins-Minerals (MULTIVITAMIN PO) Take 1 tablet by mouth daily.     [provider]  Netarsudil-Latanoprost (ROCKLATAN) 0.02-0.005 % SOLN Place 1 drop into both eyes every evening.    [provider]  Omega-3 Fatty Acids (FISH OIL) 1200 MG CAPS Take 1,200 mg by mouth daily.    [provider]  oxybutynin (DITROPAN) 5 MG tablet Take 1 tablet by mouth daily. 04/07/22   [provider]  telmisartan-hydrochlorothiazide (MICARDIS HCT) 40-12.5 MG tablet Take 1 tablet by mouth daily.     [provider]    Family History Family History  Problem Relation Age of Onset   Liver cancer Mother    Congestive Heart Failure Father    Hypertension Sister    Hyperlipidemia Sister    Diabetes Sister    Diabetes Brother    Arthritis Other    Cancer Other    Diabetes Other    Heart disease Other     Social History Social History   Tobacco Use   Smoking status: Never   Smokeless tobacco: Never  Vaping Use   Vaping Use: Never used  Substance Use Topics   Alcohol use: No   Drug use: No     Allergies   Latex and Tape   Review of Systems Review of Systems Per HPI  Physical Exam Triage Vital Signs ED Triage Vitals [08/09/22 1021]  Enc Vitals Group     BP 138/81     Pulse Rate (!) 51     Resp 20     Temp 98.5 F (36.9 C)     Temp Source Oral     SpO2 95 %     Weight      Height      Head Circumference      Peak Flow      Pain Score 0     Pain Loc      Pain Edu?      Excl. in GC?    No data found.  Updated Vital Signs BP 138/81 (BP Location: Right Arm)   Pulse (!) 51   Temp 98.5 F (36.9 C) (Oral)   Resp 20   SpO2 95%   Visual Acuity Right Eye Distance:   Left Eye Distance:   Bilateral Distance:    Right Eye Near:   Left Eye Near:    Bilateral Near:     Physical Exam Vitals and nursing  note reviewed.  Constitutional:  General: She is not in acute distress.    Appearance: Normal appearance. She is not ill-appearing or toxic-appearing.  HENT:     Head: Normocephalic and atraumatic.     Right Ear: Tympanic membrane, ear canal and external ear normal. There is impacted cerumen. Tympanic membrane is not perforated or erythematous.     Left Ear: Tympanic membrane, ear canal and external ear normal. There is impacted cerumen. Tympanic membrane is not perforated or erythematous.     Nose: No congestion or rhinorrhea.     Mouth/Throat:     Mouth: Mucous membranes are moist.     Pharynx: Oropharynx is clear. No oropharyngeal exudate or posterior oropharyngeal erythema.     Tonsils: No tonsillar exudate. 0 on the right. 0 on the left.     Comments: Cobblestoning of posterior pharynx Eyes:     General: No scleral icterus.    Extraocular Movements: Extraocular movements intact.  Cardiovascular:     Rate and Rhythm: Regular rhythm. Bradycardia present.  Pulmonary:     Effort: Pulmonary effort is normal. No respiratory distress.     Breath sounds: Normal breath sounds. No wheezing, rhonchi or rales.  Abdominal:     General: Abdomen is flat.  Musculoskeletal:     Cervical back: Normal range of motion and neck supple.  Lymphadenopathy:     Cervical: No cervical adenopathy.  Skin:    General: Skin is warm and dry.     Coloration: Skin is not jaundiced or pale.     Findings: No erythema or rash.  Neurological:     Mental Status: She is alert and oriented to person, place, and time.     Motor: No weakness.  Psychiatric:        Behavior: Behavior is cooperative.      UC Treatments / Results  Labs (all labs ordered are listed, but only abnormal results are displayed) Labs Reviewed  SARS CORONAVIRUS 2 (TAT 6-24 HRS)    EKG   Radiology No results found.  Procedures Procedures (including critical care time)  Medications Ordered in UC Medications - No data to  display  Initial Impression / Assessment and Plan / UC Course  I have reviewed the triage vital signs and the nursing notes.  Pertinent labs & imaging results that were available during my care of the patient were reviewed by me and considered in my medical decision making (see chart for details).   Patient is well-appearing, normotensive, afebrile, not tachycardic, not tachypneic, oxygenating well on room air.    Sore throat Encounter for screening for COVID-19 Post-nasal drainage Acute cough Differentials include acute viral illness, allergic rhinitis COVID-19 testing obtained; patient be a good candidate for molnupiravir if she tests positive In the meantime, treat with nasal saline rinses, Flonase, cough suppressant at bedtime as needed for dry cough ER and return precautions discussed   The patient was given the opportunity to ask questions.  All questions answered to their satisfaction.  The patient is in agreement to this plan.    Final Clinical Impressions(s) / UC Diagnoses   Final diagnoses:  Sore throat  Encounter for screening for COVID-19  Post-nasal drainage  Acute cough     Discharge Instructions      You have post nasal drainage which is likely contributing to the sore throat. We have tested you today for COVID-19 today.  You will see the results in Mychart and we will call you with positive results.    Please stay home and  isolate until you are aware of the results.    Some things that can make you feel better are: - Increased rest - Increasing fluid with water/sugar free electrolytes - Flonase nasal spray - nasal rinse before bed -Tessalon Perles as needed for dry cough     ED Prescriptions     Medication Sig Dispense Auth. Provider   fluticasone (FLONASE) 50 MCG/ACT nasal spray Place 1 spray into both nostrils daily. 16 g Cathlean Marseilles A, NP   benzonatate (TESSALON) 100 MG capsule Take 1 capsule (100 mg total) by mouth 3 (three) times daily as  needed for cough. Do not take with alcohol or while driving or operating heavy machinery.  May cause drowsiness. 21 capsule Valentino Nose, NP      PDMP not reviewed this encounter.   Valentino Nose, NP 08/09/22 1108

## 2022-08-09 NOTE — ED Triage Notes (Signed)
Pt reports "I have a cold in my throat". Pt denies any other symptoms but reports throat discomfort x2 days. Denies any known fevers.

## 2022-08-09 NOTE — Discharge Instructions (Addendum)
You have post nasal drainage which is likely contributing to the sore throat. We have tested you today for COVID-19 today.  You will see the results in Mychart and we will call you with positive results.    Please stay home and isolate until you are aware of the results.    Some things that can make you feel better are: - Increased rest - Increasing fluid with water/sugar free electrolytes - Flonase nasal spray - nasal rinse before bed -Tessalon Perles as needed for dry cough

## 2022-08-10 LAB — SARS CORONAVIRUS 2 (TAT 6-24 HRS): SARS Coronavirus 2: NEGATIVE

## 2022-08-29 ENCOUNTER — Other Ambulatory Visit: Payer: Self-pay | Admitting: Cardiology

## 2022-08-29 DIAGNOSIS — I10 Essential (primary) hypertension: Secondary | ICD-10-CM

## 2022-09-18 ENCOUNTER — Encounter: Payer: Self-pay | Admitting: Emergency Medicine

## 2022-09-18 ENCOUNTER — Ambulatory Visit
Admission: EM | Admit: 2022-09-18 | Discharge: 2022-09-18 | Disposition: A | Discharge status: Home / Self Care | Payer: Medicare Other

## 2022-09-18 DIAGNOSIS — M79641 Pain in right hand: Secondary | ICD-10-CM

## 2022-09-18 DIAGNOSIS — M79642 Pain in left hand: Secondary | ICD-10-CM

## 2022-09-18 NOTE — ED Provider Notes (Signed)
[facility]    CSN: [id number] Arrival date & time: 09/18/22  1228      History   Chief Complaint No chief complaint on file.   HPI Monique Wagner is a 81 y.o. female.   Patient presents for bilateral hand pain that has been ongoing for the past month or more.  Reports it is both hands equally.  It is worse in the morning when she wakes up.  Her hands feel stiff.  Reports she has arthritis in multiple areas of her body including her knees, hips, and neck.  No recent injuries, accidents, falls, or trauma to her hands.  Reports it is in all of her fingertips bilaterally.  Pain is worse in the morning, improves throughout the day.  No weakness, numbness or tingling, redness or swelling.  No bruising or fevers, nausea/vomiting since the hand pain began.  She reports her hands feel stiff in the morning.  Takes Tylenol multiple times daily for regular arthritis pain.  Has not tried warm compresses or Biofreeze/Voltaren gel yet, but does have these on hand.     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/26/2021   Pre-op evaluation 04/21/2021   Yeast vaginitis 03/23/2021   Vaginal discharge 03/23/2021   Osteoarthritis of left knee 05/18/2020   Anemia    Lower abdominal pain    Diverticulitis 08/28/2017   Pain in the chest    Chest pain 09/03/2014   Essential hypertension 09/03/2014   Mixed hyperlipidemia 09/03/2014   Pain in joint, shoulder region 07/10/2012   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   Osteoarthrosis, unspecified whether generalized or  localized, lower leg 04/21/2008   KNEE PAIN 04/21/2008   HIGH BLOOD PRESSURE 04/20/2008    Past Surgical History:  Procedure Laterality Date   ABDOMINAL HYSTERECTOMY     BREAST SURGERY     KNEE ARTHROPLASTY Left 05/18/2020   Procedure: COMPUTER ASSISTED TOTAL KNEE ARTHROPLASTY;  Surgeon: Samson Frederic, MD;  Location: [facility];  Service: Orthopedics;  Laterality: Left;   KNEE ARTHROPLASTY Right 04/26/2021   Procedure: COMPUTER ASSISTED TOTAL KNEE ARTHROPLASTY;  Surgeon: Samson Frederic, MD;  Location: [facility];  Service: Orthopedics;  Laterality: Right;   REVERSE SHOULDER ARTHROPLASTY Left 01/12/2022   Procedure: REVERSE SHOULDER ARTHROPLASTY;  Surgeon: Yolonda Kida, MD;  Location: [facility];  Service: Orthopedics;  Laterality: Left;  150    OB History     Gravida  1   Para  1   Term  1   Preterm      AB      Living  1      SAB      IAB      Ectopic      Multiple      Live Births  1            Home Medications    Prior to Admission medications   Medication Sig Start Date End Date Taking? Authorizing Provider  acetaminophen (TYLENOL) 325  MG tablet Take 650 mg by mouth every 6 (six) hours as needed for moderate pain.    [provider]  amLODipine (NORVASC) 5 MG tablet TAKE 1 TABLET (5 MG TOTAL) BY MOUTH DAILY. 08/29/22 08/18/24  Patwardhan, Anabel Bene, MD  atorvastatin (LIPITOR) 40 MG tablet TAKE 1 TABLET BY MOUTH EVERY DAY 05/01/21   Patwardhan, Manish J, MD  benzonatate (TESSALON) 100 MG capsule Take 1 capsule (100 mg total) by mouth 3 (three) times daily as needed for cough. Do not take with alcohol or while driving or operating heavy machinery.  May cause drowsiness. 08/09/22   Valentino Nose, NP  cholecalciferol (VITAMIN D3) 25 MCG (1000 UNIT) tablet Take 1,000 Units by mouth daily.    [provider]  CRANBERRY PO Take 1 tablet by mouth daily.    [provider]  DANDELION PO Take 1 capsule by mouth daily.    [provider]  dorzolamide-timolol (COSOPT) 22.3-6.8 MG/ML ophthalmic solution Place 1 drop into both eyes 2 (two) times daily.    [provider]  fluticasone (FLONASE) 50 MCG/ACT nasal spray Place 1 spray into both nostrils daily. 08/09/22   Valentino Nose, NP  Milk Thistle 150 MG CAPS Take 150 mg by mouth daily.    [provider]  Multiple Vitamins-Minerals (MULTIVITAMIN PO) Take 1 tablet by mouth daily.     [provider]  Netarsudil-Latanoprost (ROCKLATAN) 0.02-0.005 % SOLN Place 1 drop into both eyes every evening.    [provider]  Omega-3 Fatty Acids (FISH OIL) 1200 MG CAPS Take 1,200 mg by mouth daily.    [provider]  oxybutynin (DITROPAN) 5 MG tablet Take 1 tablet by mouth daily. 04/07/22   [provider]  telmisartan-hydrochlorothiazide (MICARDIS HCT) 40-12.5 MG tablet Take 1 tablet by mouth daily.     [provider]    Family History Family History  Problem Relation Age of Onset   Liver cancer Mother    Congestive Heart Failure Father    Hypertension Sister    Hyperlipidemia Sister    Diabetes Sister    Diabetes Brother    Arthritis Other    Cancer Other    Diabetes Other    Heart disease Other     Social History Social History   Tobacco Use   Smoking status: Never   Smokeless tobacco: Never  Vaping Use   Vaping Use: Never used  Substance Use Topics   Alcohol use: No   Drug use: No     Allergies   Latex and Tape   Review of Systems Review of Systems Per HPI  Physical Exam Triage Vital Signs ED Triage Vitals  Enc Vitals Group     BP 09/18/22 1414 (!) 147/69     Pulse Rate 09/18/22 1414 (!) 48     Resp 09/18/22 1414 20     Temp 09/18/22 1414 97.8 F (36.6 C)     Temp Source 09/18/22 1414 Oral     SpO2 09/18/22 1414 94 %     Weight --      Height --      Head Circumference --      Peak Flow --      Pain Score 09/18/22 1417 2     Pain Loc --      Pain Edu? --       Excl. in [place]? --    No data found.  Updated Vital Signs BP (!) 147/69 (BP Location: Right  Arm)   Pulse (!) 48   Temp 97.8 F (36.6 C) (Oral)   Resp 20   SpO2 94%   Visual Acuity Right Eye Distance:   Left Eye Distance:   Bilateral Distance:    Right Eye Near:   Left Eye Near:    Bilateral Near:     Physical Exam Vitals and nursing note reviewed.  Constitutional:      General: She is not in acute distress.    Appearance: Normal appearance. She is not toxic-appearing.  HENT:     Mouth/Throat:     Mouth: Mucous membranes are moist.     Pharynx: Oropharynx is clear.  Pulmonary:     Effort: Pulmonary effort is normal. No respiratory distress.  Musculoskeletal:     Right wrist: Normal. No swelling, deformity, tenderness, bony tenderness or snuff box tenderness. Normal range of motion. Normal pulse.     Left wrist: Normal. No swelling, deformity, tenderness, bony tenderness or snuff box tenderness. Normal range of motion.     Right hand: Bony tenderness present. No swelling, deformity or tenderness. Normal range of motion. Normal strength. Normal sensation. Normal capillary refill. Normal pulse.     Left hand: Bony tenderness present. No swelling, deformity or tenderness. Normal range of motion. Normal strength. Normal sensation. Normal capillary refill. Normal pulse.     Comments: Tenderness to palpation of MCP, PIP, and DIP joints bilaterally  Skin:    General: Skin is warm and dry.     Capillary Refill: Capillary refill takes less than 2 seconds.     Coloration: Skin is not jaundiced or pale.     Findings: No erythema.  Neurological:     Mental Status: She is alert and oriented to person, place, and time.  Psychiatric:        Behavior: Behavior is cooperative.      UC Treatments / Results  Labs (all labs ordered are listed, but only abnormal results are displayed) Labs Reviewed - No data to display  EKG   Radiology No results found.  Procedures Procedures  (including critical care time)  Medications Ordered in UC Medications - No data to display  Initial Impression / Assessment and Plan / UC Course  I have reviewed the triage vital signs and the nursing notes.  Pertinent labs & imaging results that were available during my care of the patient were reviewed by me and considered in my medical decision making (see chart for details).   Patient is well-appearing, normotensive, afebrile, not tachycardic, not tachypneic, oxygenating well on room air.    Bilateral hand pain Suspect arthritis X-ray imaging deferred given no recent injury Recommended continuing treatment with Tylenol, warm compresses, topical gels like Biofreeze/Voltaren Recommended follow-up with PCP with no improvement in symptoms despite treatment  The patient was given the opportunity to ask questions.  All questions answered to their satisfaction.  The patient is in agreement to this plan.    Final Clinical Impressions(s) / UC Diagnoses   Final diagnoses:  Bilateral hand pain     Discharge Instructions      I suspect the pain in your hands in coming from arthritis.  Continue Tylenol.  Start warm compresses or sleeping in gloves. You can use the topical Voltaren gel to help with pain as well.   ED Prescriptions   None    PDMP not reviewed this encounter.   Valentino Nose, NP 09/18/22 1450

## 2022-09-18 NOTE — ED Triage Notes (Signed)
Bilateral hand pain x 1 month.

## 2022-09-18 NOTE — Discharge Instructions (Addendum)
I suspect the pain in your hands in coming from arthritis.  Continue Tylenol.  Start warm compresses or sleeping in gloves. You can use the topical Voltaren gel to help with pain as well.

## 2022-11-17 ENCOUNTER — Other Ambulatory Visit: Payer: Self-pay | Admitting: Nurse Practitioner

## 2022-11-19 NOTE — Telephone Encounter (Signed)
Unable to refill per protocol, last refill by another provider not at this practice.  Requested Prescriptions  Pending Prescriptions Disp Refills   fluticasone (FLONASE) 50 MCG/ACT nasal spray [Pharmacy Med Name: FLUTICASONE PROP 50 MCG SPRAY] 16 mL     Sig: SPRAY 1 SPRAY INTO BOTH NOSTRILS DAILY.     Ear, Nose, and Throat: Nasal Preparations - Corticosteroids Failed - 11/17/2022  3:53 PM      Failed - Valid encounter within last 12 months    Recent Outpatient Visits   None

## 2022-11-29 ENCOUNTER — Encounter: Payer: Self-pay | Admitting: Emergency Medicine

## 2022-11-29 ENCOUNTER — Ambulatory Visit
Admission: EM | Admit: 2022-11-29 | Discharge: 2022-11-29 | Disposition: A | Discharge status: Home / Self Care | Payer: Medicare Other | Attending: Nurse Practitioner | Admitting: Nurse Practitioner

## 2022-11-29 ENCOUNTER — Other Ambulatory Visit: Payer: Self-pay

## 2022-11-29 DIAGNOSIS — J069 Acute upper respiratory infection, unspecified: Secondary | ICD-10-CM | POA: Insufficient documentation

## 2022-11-29 DIAGNOSIS — Z1152 Encounter for screening for COVID-19: Secondary | ICD-10-CM | POA: Diagnosis present

## 2022-11-29 LAB — POCT INFLUENZA A/B
Influenza A, POC: NEGATIVE
Influenza B, POC: NEGATIVE

## 2022-11-29 MED ORDER — ACETAMINOPHEN 500 MG PO TABS
500.0000 mg | ORAL_TABLET | Freq: Once | ORAL | Status: AC
  Administered 2022-11-29: 500 mg via ORAL

## 2022-11-29 NOTE — Discharge Instructions (Addendum)
You have a viral upper respiratory infection.  Symptoms should improve over the next week to 10 days.  If you develop chest pain or shortness of breath, go to the emergency room.  We have tested you today for COVID-19 and influenza.  Influenza test today was negative.  You will see the COVID-19 results in [place] and we will call you with positive results.  Please stay home and isolate until you are aware of the results.    Some things that can make you feel better are: - Increased rest - Increasing fluid with water/sugar free electrolytes - Acetaminophen and ibuprofen as needed for fever/pain - Salt water gargling, chloraseptic spray and throat lozenges - OTC guaifenesin (Mucinex) 600 mg twice daily for congestion - Saline sinus flushes or a neti pot - Humidifying the air

## 2022-11-29 NOTE — ED Triage Notes (Signed)
Pt reports cough, chills, loss of appetite since last night. Denies any known fevers, abd pain, emesis, diarrhea.

## 2022-11-29 NOTE — ED Provider Notes (Signed)
[facility]    CSN: [id number] Arrival date & time: 11/29/22  1142      History   Chief Complaint Chief Complaint  Patient presents with   Cough    HPI Monique Wagner is a 82 y.o. female.   Patient presents today with 1 day history of fever.  Also having fever, chills, dry cough, nasal congestion, runny nose, and decreased appetite.  Patient denies congestion, sore throat, congested cough, shortness of breath, chest pain with coughing, wheezing, chest tightness, chest congestion, post nasal drainage, sinus pressure, headache, ear pain, abdominal pain, nausea, vomiting, diarrhea, loss of taste, loss of smell, rash, and fatigue. No known sick contacts.  Has taken Tylenol and Coricidin which helps with symptoms temporarily.    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   Osteoarthritis of right knee 04/26/2021   Pre-op evaluation 04/21/2021   Yeast vaginitis 03/23/2021   Vaginal discharge 03/23/2021   Osteoarthritis of left knee 05/18/2020   Anemia    Lower abdominal pain    Diverticulitis 08/28/2017   Pain in the chest    Chest pain 09/03/2014   Essential hypertension 09/03/2014   Mixed hyperlipidemia 09/03/2014   Pain in joint, shoulder region 07/10/2012   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   Osteoarthrosis, unspecified whether generalized or localized, lower leg 04/21/2008   KNEE PAIN 04/21/2008   HIGH BLOOD PRESSURE 04/20/2008    Past Surgical History:  Procedure Laterality Date   ABDOMINAL HYSTERECTOMY     BREAST SURGERY     KNEE ARTHROPLASTY Left 05/18/2020    Procedure: COMPUTER ASSISTED TOTAL KNEE ARTHROPLASTY;  Surgeon: Rod Can, MD;  Location: [facility];  Service: Orthopedics;  Laterality: Left;   KNEE ARTHROPLASTY Right 04/26/2021   Procedure: COMPUTER ASSISTED TOTAL KNEE ARTHROPLASTY;  Surgeon: Rod Can, MD;  Location: [facility];  Service: Orthopedics;  Laterality: Right;   REVERSE SHOULDER ARTHROPLASTY Left 01/12/2022   Procedure: REVERSE SHOULDER ARTHROPLASTY;  Surgeon: Nicholes Stairs, MD;  Location: [facility];  Service: Orthopedics;  Laterality: Left;  150    OB History     Gravida  1   Para  1   Term  1   Preterm      AB      Living  1      SAB      IAB      Ectopic      Multiple      Live Births  1            Home Medications    Prior to Admission medications   Medication Sig Start Date End Date Taking? Authorizing Provider  predniSONE (DELTASONE) 5 MG tablet Take 10 mg by mouth as needed.   Yes [provider]  acetaminophen (TYLENOL) 325 MG tablet Take 650 mg by mouth every 6 (six) hours as needed for moderate pain.    [provider]  amLODipine (NORVASC) 5 MG tablet TAKE 1 TABLET (5 MG TOTAL) BY MOUTH DAILY. 08/29/22 08/18/24  Patwardhan,  Manish J, MD  atorvastatin (LIPITOR) 40 MG tablet TAKE 1 TABLET BY MOUTH EVERY DAY 05/01/21   Patwardhan, Manish J, MD  cholecalciferol (VITAMIN D3) 25 MCG (1000 UNIT) tablet Take 1,000 Units by mouth daily.    [provider]  CRANBERRY PO Take 1 tablet by mouth daily.    [provider]  DANDELION PO Take 1 capsule by mouth daily.    [provider]  dorzolamide-timolol (COSOPT) 22.3-6.8 MG/ML ophthalmic solution Place 1 drop into both eyes 2 (two) times daily.    [provider]  Milk Thistle 150 MG CAPS Take 150 mg by mouth daily.    [provider]  Multiple Vitamins-Minerals (MULTIVITAMIN PO) Take 1 tablet by mouth daily.     [provider]  Netarsudil-Latanoprost (ROCKLATAN)  0.02-0.005 % SOLN Place 1 drop into both eyes every evening.    [provider]  Omega-3 Fatty Acids (FISH OIL) 1200 MG CAPS Take 1,200 mg by mouth daily.    [provider]  oxybutynin (DITROPAN) 5 MG tablet Take 1 tablet by mouth daily. 04/07/22   [provider]  telmisartan-hydrochlorothiazide (MICARDIS HCT) 40-12.5 MG tablet Take 1 tablet by mouth daily.     [provider]    Family History Family History  Problem Relation Age of Onset   Liver cancer Mother    Congestive Heart Failure Father    Hypertension Sister    Hyperlipidemia Sister    Diabetes Sister    Diabetes Brother    Arthritis Other    Cancer Other    Diabetes Other    Heart disease Other     Social History Social History   Tobacco Use   Smoking status: Never   Smokeless tobacco: Never  Vaping Use   Vaping Use: Never used  Substance Use Topics   Alcohol use: No   Drug use: No     Allergies   Latex and Tape   Review of Systems Review of Systems Per HPI  Physical Exam Triage Vital Signs ED Triage Vitals  Enc Vitals Group     BP 11/29/22 1415 (!) 143/76     Pulse Rate 11/29/22 1415 65     Resp 11/29/22 1415 20     Temp 11/29/22 1415 (!) 101 F (38.3 C)     Temp Source 11/29/22 1415 Oral     SpO2 11/29/22 1415 95 %     Weight --      Height --      Head Circumference --      Peak Flow --      Pain Score 11/29/22 1413 0     Pain Loc --      Pain Edu? --      Excl. in [place]? --    No data found.  Updated Vital Signs BP (!) 143/76 (BP Location: Right Arm)   Pulse 65   Temp (!) 101 F (38.3 C) (Oral)   Resp 20   SpO2 95%   Visual Acuity Right Eye Distance:   Left Eye Distance:   Bilateral Distance:    Right Eye Near:   Left Eye Near:    Bilateral Near:     Physical Exam Vitals and nursing note reviewed.  Constitutional:      General: She is not in acute distress.    Appearance: Normal appearance. She is not ill-appearing or toxic-appearing.   HENT:     Head: Normocephalic and atraumatic.     Right Ear:  Tympanic membrane, ear canal and external ear normal.     Left Ear: Tympanic membrane, ear canal and external ear normal.     Nose: Congestion present. No rhinorrhea.     Mouth/Throat:     Mouth: Mucous membranes are moist.     Pharynx: Oropharynx is clear. No oropharyngeal exudate or posterior oropharyngeal erythema.  Eyes:     General: No scleral icterus.    Extraocular Movements: Extraocular movements intact.  Cardiovascular:     Rate and Rhythm: Normal rate and regular rhythm.  Pulmonary:     Effort: Pulmonary effort is normal. No respiratory distress.     Breath sounds: Normal breath sounds. No wheezing, rhonchi or rales.  Abdominal:     General: Abdomen is flat. Bowel sounds are normal. There is no distension.     Palpations: Abdomen is soft.     Tenderness: There is no abdominal tenderness.  Musculoskeletal:     Cervical back: Normal range of motion and neck supple.  Lymphadenopathy:     Cervical: No cervical adenopathy.  Skin:    General: Skin is warm and dry.     Coloration: Skin is not jaundiced or pale.     Findings: No erythema or rash.  Neurological:     Mental Status: She is alert and oriented to person, place, and time.  Psychiatric:        Behavior: Behavior is cooperative.      UC Treatments / Results  Labs (all labs ordered are listed, but only abnormal results are displayed) Labs Reviewed  POCT INFLUENZA A/B    EKG   Radiology No results found.  Procedures Procedures (including critical care time)  Medications Ordered in UC Medications  acetaminophen (TYLENOL) tablet 500 mg (500 mg Oral Given 11/29/22 1422)    Initial Impression / Assessment and Plan / UC Course  I have reviewed the triage vital signs and the nursing notes.  Pertinent labs & imaging results that were available during my care of the patient were reviewed by me and considered in my medical decision making (see  chart for details).   Patient is well-appearing, normotensive, not tachycardic, not tachypneic, oxygenating well on room air.  She is febrile at 101 degrees Fahrenheit; this improves with Tylenol 500 mg to 99.6 F  1. Encounter for screening for COVID-19 2. Viral URI Suspect viral etiology Vital signs and examination today are reassuring Influenza A and B are negative today COVID-19 test is pending Patient is a candidate for molnupiravir if she tests positive Supportive care discussed ER and return precautions discussed  The patient was given the opportunity to ask questions.  All questions answered to their satisfaction.  The patient is in agreement to this plan.    Final Clinical Impressions(s) / UC Diagnoses   Final diagnoses:  Encounter for screening for COVID-19  Viral URI     Discharge Instructions      You have a viral upper respiratory infection.  Symptoms should improve over the next week to 10 days.  If you develop chest pain or shortness of breath, go to the emergency room.  We have tested you today for COVID-19 and influenza.  Influenza test today was negative.  You will see the COVID-19 results in [place] and we will call you with positive results.  Please stay home and isolate until you are aware of the results.    Some things that can make you feel better are: - Increased rest - Increasing fluid with water/sugar  free electrolytes - Acetaminophen and ibuprofen as needed for fever/pain - Salt water gargling, chloraseptic spray and throat lozenges - OTC guaifenesin (Mucinex) 600 mg twice daily for congestion - Saline sinus flushes or a neti pot - Humidifying the air     ED Prescriptions   None    PDMP not reviewed this encounter.   Eulogio Bear, NP 11/29/22 1450

## 2022-11-30 LAB — SARS CORONAVIRUS 2 (TAT 6-24 HRS): SARS Coronavirus 2: NEGATIVE

## 2023-02-03 ENCOUNTER — Other Ambulatory Visit: Payer: Self-pay | Admitting: Nurse Practitioner

## 2023-02-03 ENCOUNTER — Other Ambulatory Visit: Payer: Self-pay | Admitting: Cardiology

## 2023-02-03 DIAGNOSIS — I10 Essential (primary) hypertension: Secondary | ICD-10-CM

## 2023-02-07 ENCOUNTER — Other Ambulatory Visit (HOSPITAL_COMMUNITY): Payer: Self-pay | Admitting: Gastroenterology

## 2023-02-07 DIAGNOSIS — R1032 Left lower quadrant pain: Secondary | ICD-10-CM

## 2023-02-08 ENCOUNTER — Ambulatory Visit (HOSPITAL_COMMUNITY)
Admission: RE | Admit: 2023-02-08 | Discharge: 2023-02-08 | Disposition: A | Discharge status: Home / Self Care | Payer: Medicare Other | Source: Ambulatory Visit | Attending: Gastroenterology | Admitting: Gastroenterology

## 2023-02-08 DIAGNOSIS — R1032 Left lower quadrant pain: Secondary | ICD-10-CM

## 2023-02-08 LAB — POCT I-STAT CREATININE: Creatinine, Ser: 1.1 mg/dL — ABNORMAL HIGH (ref 0.44–1.00)

## 2023-02-08 MED ORDER — IOHEXOL 9 MG/ML PO SOLN
ORAL | Status: AC
  Filled 2023-02-08: qty 1000

## 2023-02-08 MED ORDER — IOHEXOL 300 MG/ML  SOLN
100.0000 mL | Freq: Once | INTRAMUSCULAR | Status: AC | PRN
  Administered 2023-02-08: 85 mL via INTRAVENOUS

## 2023-03-11 ENCOUNTER — Ambulatory Visit (HOSPITAL_COMMUNITY)
Admission: RE | Admit: 2023-03-11 | Discharge: 2023-03-11 | Disposition: A | Discharge status: Home / Self Care | Payer: Medicare Other | Source: Ambulatory Visit | Attending: Family Medicine | Admitting: Family Medicine

## 2023-03-11 ENCOUNTER — Other Ambulatory Visit (HOSPITAL_COMMUNITY): Payer: Self-pay | Admitting: Family Medicine

## 2023-03-11 DIAGNOSIS — M542 Cervicalgia: Secondary | ICD-10-CM

## 2023-07-05 ENCOUNTER — Other Ambulatory Visit (HOSPITAL_COMMUNITY): Payer: Self-pay | Admitting: Family Medicine

## 2023-07-05 DIAGNOSIS — Z1231 Encounter for screening mammogram for malignant neoplasm of breast: Secondary | ICD-10-CM

## 2023-08-12 ENCOUNTER — Encounter (HOSPITAL_COMMUNITY): Payer: Self-pay

## 2023-08-12 ENCOUNTER — Ambulatory Visit (HOSPITAL_COMMUNITY)
Admission: RE | Admit: 2023-08-12 | Discharge: 2023-08-12 | Disposition: A | Discharge status: Home / Self Care | Payer: Medicare Other | Source: Ambulatory Visit | Attending: Family Medicine | Admitting: Family Medicine

## 2023-08-12 DIAGNOSIS — Z1231 Encounter for screening mammogram for malignant neoplasm of breast: Secondary | ICD-10-CM | POA: Diagnosis present

## 2023-10-09 ENCOUNTER — Ambulatory Visit (HOSPITAL_COMMUNITY)
Admission: RE | Admit: 2023-10-09 | Discharge: 2023-10-09 | Disposition: A | Discharge status: Home / Self Care | Payer: Medicare Other | Source: Ambulatory Visit | Attending: Family Medicine | Admitting: Family Medicine

## 2023-10-09 ENCOUNTER — Other Ambulatory Visit (HOSPITAL_COMMUNITY): Payer: Self-pay | Admitting: Family Medicine

## 2023-10-09 DIAGNOSIS — M25551 Pain in right hip: Secondary | ICD-10-CM | POA: Diagnosis present

## 2023-10-09 DIAGNOSIS — M25552 Pain in left hip: Secondary | ICD-10-CM

## 2023-10-09 DIAGNOSIS — M545 Low back pain, unspecified: Secondary | ICD-10-CM

## 2024-07-10 ENCOUNTER — Other Ambulatory Visit (HOSPITAL_COMMUNITY): Payer: Self-pay | Admitting: Family Medicine

## 2024-07-10 DIAGNOSIS — Z1231 Encounter for screening mammogram for malignant neoplasm of breast: Secondary | ICD-10-CM

## 2024-08-14 ENCOUNTER — Inpatient Hospital Stay (HOSPITAL_COMMUNITY): Admission: RE | Admit: 2024-08-14 | Source: Ambulatory Visit

## 2024-08-17 ENCOUNTER — Ambulatory Visit (HOSPITAL_COMMUNITY)
Admission: RE | Admit: 2024-08-17 | Discharge: 2024-08-17 | Disposition: A | Discharge status: Home / Self Care | Source: Ambulatory Visit | Attending: Family Medicine | Admitting: Family Medicine

## 2024-08-17 DIAGNOSIS — Z1231 Encounter for screening mammogram for malignant neoplasm of breast: Secondary | ICD-10-CM | POA: Insufficient documentation
